# Patient Record
Sex: Male | Born: 1945
Health system: Southern US, Community
[De-identification: ages and names within clinical notes are randomized; demographics above are authoritative.]

## PROBLEM LIST (undated history)

## (undated) ENCOUNTER — Emergency Department (HOSPITAL_COMMUNITY): Admission: EM | Discharge status: Against Medical Advice | Payer: Medicare Other

## (undated) DIAGNOSIS — I10 Essential (primary) hypertension: Secondary | ICD-10-CM

## (undated) DIAGNOSIS — E785 Hyperlipidemia, unspecified: Secondary | ICD-10-CM

## (undated) DIAGNOSIS — E119 Type 2 diabetes mellitus without complications: Secondary | ICD-10-CM

## (undated) DIAGNOSIS — I639 Cerebral infarction, unspecified: Secondary | ICD-10-CM

## (undated) DIAGNOSIS — G459 Transient cerebral ischemic attack, unspecified: Secondary | ICD-10-CM

## (undated) DIAGNOSIS — R569 Unspecified convulsions: Secondary | ICD-10-CM

## (undated) DIAGNOSIS — J45909 Unspecified asthma, uncomplicated: Secondary | ICD-10-CM

## (undated) DIAGNOSIS — G4733 Obstructive sleep apnea (adult) (pediatric): Secondary | ICD-10-CM

## (undated) DIAGNOSIS — F329 Major depressive disorder, single episode, unspecified: Secondary | ICD-10-CM

## (undated) DIAGNOSIS — R471 Dysarthria and anarthria: Secondary | ICD-10-CM

## (undated) DIAGNOSIS — C61 Malignant neoplasm of prostate: Secondary | ICD-10-CM

## (undated) DIAGNOSIS — J449 Chronic obstructive pulmonary disease, unspecified: Secondary | ICD-10-CM

## (undated) DIAGNOSIS — F32A Depression, unspecified: Secondary | ICD-10-CM

## (undated) HISTORY — DX: Obstructive sleep apnea (adult) (pediatric): G47.33

## (undated) HISTORY — DX: Hyperlipidemia, unspecified: E78.5

## (undated) HISTORY — DX: Type 2 diabetes mellitus without complications: E11.9

## (undated) HISTORY — DX: Chronic obstructive pulmonary disease, unspecified: J44.9

## (undated) HISTORY — DX: Unspecified convulsions: R56.9

## (undated) HISTORY — DX: Depression, unspecified: F32.A

## (undated) HISTORY — DX: Malignant neoplasm of prostate: C61

## (undated) HISTORY — DX: Transient cerebral ischemic attack, unspecified: G45.9

## (undated) HISTORY — DX: Essential (primary) hypertension: I10

## (undated) HISTORY — DX: Dysarthria and anarthria: R47.1

## (undated) HISTORY — DX: Major depressive disorder, single episode, unspecified: F32.9

## (undated) HISTORY — DX: Cerebral infarction, unspecified: I63.9

---

## 2005-06-02 ENCOUNTER — Ambulatory Visit: Payer: Self-pay | Admitting: Cardiology

## 2006-10-01 ENCOUNTER — Ambulatory Visit: Payer: Self-pay | Admitting: Cardiology

## 2006-10-08 ENCOUNTER — Ambulatory Visit: Payer: Self-pay | Admitting: Cardiology

## 2010-08-14 DIAGNOSIS — R7989 Other specified abnormal findings of blood chemistry: Secondary | ICD-10-CM

## 2010-08-14 DIAGNOSIS — R9431 Abnormal electrocardiogram [ECG] [EKG]: Secondary | ICD-10-CM

## 2010-08-15 DIAGNOSIS — R079 Chest pain, unspecified: Secondary | ICD-10-CM

## 2011-01-16 DIAGNOSIS — I639 Cerebral infarction, unspecified: Secondary | ICD-10-CM

## 2011-01-16 HISTORY — DX: Cerebral infarction, unspecified: I63.9

## 2011-03-26 ENCOUNTER — Ambulatory Visit: Payer: Medicare Other | Attending: Neurology | Admitting: Sleep Medicine

## 2011-03-26 DIAGNOSIS — G4730 Sleep apnea, unspecified: Secondary | ICD-10-CM

## 2011-03-26 DIAGNOSIS — G4733 Obstructive sleep apnea (adult) (pediatric): Secondary | ICD-10-CM | POA: Insufficient documentation

## 2011-03-26 DIAGNOSIS — Z6836 Body mass index (BMI) 36.0-36.9, adult: Secondary | ICD-10-CM | POA: Insufficient documentation

## 2011-04-03 NOTE — Procedures (Signed)
NAMERANDEN, KAUTH              ACCOUNT NO.:  [id number]  MEDICAL RECORD NO.:  [id number]          PATIENT TYPE:  OUT  LOCATION:  SLEEP LAB                     FACILITY:  [facility]  PHYSICIAN:  Hjalmar Ballengee A. Gerilyn Pilgrim, M.D. DATE OF BIRTH:  30-Aug-1945  DATE OF STUDY:                           NOCTURNAL POLYSOMNOGRAM  REFERRING PHYSICIAN:  INDICATION:  A 65 year old man who presents with hypersomnia, fatigue and snoring.  This study is being done to evaluate for obstructive sleep apnea syndrome.  MEDICATIONS:  Metformin, Paxil, amoxicillin, omeprazole, hydrocodone, diltiazem, prednisone, terazosin, lisinopril and phenytoin.  EPWORTH SLEEPINESS SCALE: 1. BMI 36.  ARCHITECTURAL SUMMARY:  The total recording time is 426 minutes.  Sleep efficiency is 67.3%.  Sleep latency 25 minutes.  REM latency 313.5 minutes.  Stage N1 16.1%, N2 75%, N3 7.2% and REM sleep 1.7%.  RESPIRATORY SUMMARY:  Baseline oxygen saturation is 95, lowest saturation 85 during non-REM sleep.  Diagnostic AHI is 28.9 and RDI 38.1.  LIMB MOVEMENT SUMMARY:  PLM index 0.  ELECTROCARDIOGRAM SUMMARY:  Average heart rate is 60 with no significant dysrhythmias observed.  IMPRESSION:  Moderate obstructive sleep apnea syndrome.  RECOMMENDATION:  Positive CPAP titration study.   Dajah Fischman A. Gerilyn Pilgrim, M.D.    KAD/MEDQ  D:  04/01/2011 19:17:33  T:  04/02/2011 02:05:07  Job:  [id number]

## 2011-07-10 DIAGNOSIS — R9431 Abnormal electrocardiogram [ECG] [EKG]: Secondary | ICD-10-CM

## 2011-07-10 DIAGNOSIS — R748 Abnormal levels of other serum enzymes: Secondary | ICD-10-CM

## 2013-03-17 HISTORY — PX: XI ROBOTIC ASSISTED SIMPLE PROSTATECTOMY: SHX6713

## 2014-02-05 ENCOUNTER — Ambulatory Visit (HOSPITAL_COMMUNITY)
Admission: RE | Admit: 2014-02-05 | Discharge: 2014-02-05 | Disposition: A | Discharge status: Home / Self Care | Payer: Medicare PPO | Source: Ambulatory Visit | Attending: Internal Medicine | Admitting: Internal Medicine

## 2014-02-05 DIAGNOSIS — R7881 Bacteremia: Secondary | ICD-10-CM | POA: Diagnosis not present

## 2014-02-05 MED ORDER — SODIUM CHLORIDE 0.9 % IJ SOLN: 10.0000 mL | Freq: Two times a day (BID) | INTRAMUSCULAR | Status: DC

## 2014-02-05 MED ORDER — SODIUM CHLORIDE 0.9 % IJ SOLN
10.0000 mL | INTRAMUSCULAR | Status: DC | PRN
  Administered 2014-02-05: 20 mL

## 2014-02-05 NOTE — Progress Notes (Signed)
Peripherally Inserted Central Catheter/Midline Placement  The IV Nurse has discussed with the patient and/or persons authorized to consent for the patient, the purpose of this procedure and the potential benefits and risks involved with this procedure.  The benefits include less needle sticks, lab draws from the catheter and patient may be discharged home with the catheter.  Risks include, but not limited to, infection, bleeding, blood clot (thrombus formation), and puncture of an artery; nerve damage and irregular heat beat.  Alternatives to this procedure were also discussed.  PICC/Midline Placement Documentation  PICC / Midline Single Lumen 85/63/14 PICC Right Basilic 45 cm 2 cm (Active)  Indication for Insertion or Continuance of Line Limited venous access - need for IV therapy >5 days (PICC only);Administration of hyperosmolar/irritating solutions (i.e. TPN, Vancomycin, etc.) 02/05/2014  9:06 AM  Exposed Catheter (cm) 2 cm 02/05/2014  9:06 AM  Site Assessment Clean;Dry;Intact 02/05/2014  9:06 AM  Line Status Flushed;Saline locked;Capped (central line);Accessed 02/05/2014  9:06 AM  Dressing Type Transparent;Securing device 02/05/2014  9:06 AM  Dressing Status Clean;Dry;Intact;Antimicrobial disc in place 02/05/2014  9:06 AM  Dressing Intervention New dressing 02/05/2014  9:06 AM  Dressing Change Due 02/12/14 02/05/2014  9:06 AM       Petra Kuba D 02/05/2014, 9:10 AM

## 2014-02-05 NOTE — Discharge Instructions (Signed)
PICC Home Guide A peripherally inserted central catheter (PICC) is a long, thin, flexible tube that is inserted into a vein in the upper arm. It is a form of intravenous (IV) access. It is considered to be a "central" line because the tip of the PICC ends in a large vein in your chest. This large vein is called the superior vena cava (SVC). The PICC tip ends in the SVC because there is a lot of blood flow in the SVC. This allows medicines and IV fluids to be quickly distributed throughout the body. The PICC is inserted using a sterile technique by a specially trained nurse or physician. After the PICC is inserted, a chest X-ray exam is done to be sure it is in the correct place.  A PICC may be placed for different reasons, such as:  To give medicines and liquid nutrition that can only be given through a central line. Examples are:  Certain antibiotic treatments.  Chemotherapy.  Total parenteral nutrition (TPN).  To take frequent blood samples.  To give IV fluids and blood products.  If there is difficulty placing a peripheral intravenous (PIV) catheter. If taken care of properly, a PICC can remain in place for several months. A PICC can also allow a person to go home from the hospital early. Medicine and PICC care can be managed at home by a family member or home health care team. WHAT PROBLEMS CAN HAPPEN WHEN I HAVE A PICC? Problems with a PICC can occasionally occur. These may include the following:  A blood clot (thrombus) forming in or at the tip of the PICC. This can cause the PICC to become clogged. A clot-dissolving medicine called tissue plasminogen activator (tPA) can be given through the PICC to help break up the clot.  Inflammation of the vein (phlebitis) in which the PICC is placed. Signs of inflammation may include redness, pain at the insertion site, red streaks, or being able to feel a "cord" in the vein where the PICC is located.  Infection in the PICC or at the insertion  site. Signs of infection may include fever, chills, redness, swelling, or pus drainage from the PICC insertion site.  PICC movement (malposition). The PICC tip may move from its original position due to excessive physical activity, forceful coughing, sneezing, or vomiting.  A break or cut in the PICC. It is important to not use scissors near the PICC.  Nerve or tendon irritation or injury during PICC insertion. WHAT SHOULD I KEEP IN MIND ABOUT ACTIVITIES WHEN I HAVE A PICC?  You may bend your arm and move it freely. If your PICC is near or at the bend of your elbow, avoid activity with repeated motion at the elbow.  Rest at home for the remainder of the day following PICC line insertion.  Avoid lifting heavy objects as instructed by your health care provider.  Avoid using a crutch with the arm on the same side as your PICC. You may need to use a walker. WHAT SHOULD I KNOW ABOUT MY PICC DRESSING?  Keep your PICC bandage (dressing) clean and dry to prevent infection.  Ask your health care provider when you may shower. Ask your health care provider to teach you how to wrap the PICC when you do take a shower.  Change the PICC dressing as instructed by your health care provider.  Change your PICC dressing if it becomes loose or wet. WHAT SHOULD I KNOW ABOUT PICC CARE?  Check the PICC insertion site   daily for leakage, redness, swelling, or pain.  Do not take a bath, swim, or use hot tubs when you have a PICC. Cover PICC line with clear plastic wrap and tape to keep it dry while showering.  Flush the PICC as directed by your health care provider. Let your health care provider know right away if the PICC is difficult to flush or does not flush. Do not use force to flush the PICC.  Do not use a syringe that is less than 10 mL to flush the PICC.  Never pull or tug on the PICC.  Avoid blood pressure checks on the arm with the PICC.  Keep your PICC identification card with you at all  times.  Do not take the PICC out yourself. Only a trained clinical professional should remove the PICC. SEEK IMMEDIATE MEDICAL CARE IF:  Your PICC is accidentally pulled all the way out. If this happens, cover the insertion site with a bandage or gauze dressing. Do not throw the PICC away. Your health care provider will need to inspect it.  Your PICC was tugged or pulled and has partially come out. Do not  push the PICC back in.  There is any type of drainage, redness, or swelling where the PICC enters the skin.  You cannot flush the PICC, it is difficult to flush, or the PICC leaks around the insertion site when it is flushed.  You hear a "flushing" sound when the PICC is flushed.  You have pain, discomfort, or numbness in your arm, shoulder, or jaw on the same side as the PICC.  You feel your heart "racing" or skipping beats.  You notice a hole or tear in the PICC.  You develop chills or a fever. MAKE SURE YOU:   Understand these instructions.  Will watch your condition.  Will get help right away if you are not doing well or get worse. Placement verified with 3CG technology Call for any problems [phone]; Micheal Sheen or Anderson Malta

## 2015-11-15 ENCOUNTER — Encounter: Payer: Self-pay | Admitting: *Deleted

## 2015-11-15 ENCOUNTER — Other Ambulatory Visit: Payer: Self-pay | Admitting: *Deleted

## 2015-11-16 ENCOUNTER — Encounter: Payer: Self-pay | Admitting: Cardiology

## 2015-11-16 ENCOUNTER — Encounter: Payer: Self-pay | Admitting: *Deleted

## 2015-11-16 ENCOUNTER — Ambulatory Visit (INDEPENDENT_AMBULATORY_CARE_PROVIDER_SITE_OTHER): Payer: Medicare PPO | Admitting: Cardiology

## 2015-11-16 VITALS — BP 127/76 | HR 66 | Ht 69.0 in | Wt 221.0 lb

## 2015-11-16 DIAGNOSIS — Z01810 Encounter for preprocedural cardiovascular examination: Secondary | ICD-10-CM

## 2015-11-16 DIAGNOSIS — E785 Hyperlipidemia, unspecified: Secondary | ICD-10-CM | POA: Diagnosis not present

## 2015-11-16 DIAGNOSIS — I10 Essential (primary) hypertension: Secondary | ICD-10-CM

## 2015-11-16 DIAGNOSIS — R0602 Shortness of breath: Secondary | ICD-10-CM | POA: Diagnosis not present

## 2015-11-16 DIAGNOSIS — Z136 Encounter for screening for cardiovascular disorders: Secondary | ICD-10-CM

## 2015-11-16 NOTE — Patient Instructions (Signed)
Medication Instructions:  Continue all current medications.  Labwork: NONE   Testing/Procedures:  Your physician has requested that you have an echocardiogram. Echocardiography is a painless test that uses sound waves to create images of your heart. It provides your doctor with information about the size and shape of your heart and how well your heart's chambers and valves are working. This procedure takes approximately one hour. There are no restrictions for this procedure.  Office will contact with results via phone or letter.    Follow-Up: Pending, to be based off test result.    Any Other Special Instructions Will Be Listed Below (If Applicable).  If you need a refill on your cardiac medications before your next appointment, please call your pharmacy.

## 2015-11-16 NOTE — Progress Notes (Addendum)
Clinical Summary Derrick Bender is a 70 y.o.male seen today as a new patient. He was previously followed by [facility] Dr Remer Macho. Referred by Dr Woody Seller for preoperative evaluation.    1. Preoperative evaluation - upcoming urological surgery at [facility] - denies any chest pain. + SOB/DOE room to room at home, which is stable. Denies any LE edema. +coughing + wheezing. Former smoker x 30 years. No orthopnea or PND  2. OSA - compliant with CPAP  3. HTN - compliant with meds  4. Hyperlipidemia - compliant with simvastatin.  5. History of TIA - has been on plavix  6. COPD - followed by primary Past Medical History:  Diagnosis Date  . COPD (chronic obstructive pulmonary disease) ([place])   . CVA (cerebral vascular accident) ([place]) 01/2011   [place]  . Depression   . Diabetes ([place])   . Dysarthria   . Hyperlipidemia   . Hypertension   . OSA (obstructive sleep apnea)   . Prostate cancer ([place])    Dr. Exie Parody  . Seizure ([place])   . TIA (transient ischemic attack)      Allergies  Allergen Reactions  . Penicillin G     Other reaction(s): Other **MOUTH LESIONS**  . Sulfamethoxazole Dermatitis  . Aggrenox [Aspirin-Dipyridamole Er] Other (See Comments)    headache  . Cephalosporins Itching  . Prilosec [Omeprazole] Itching  . Vancomycin Rash     Current Outpatient Prescriptions  Medication Sig Dispense Refill  . albuterol (PROAIR HFA) 108 (90 Base) MCG/ACT inhaler Inhale 2 puffs into the lungs every 6 (six) hours as needed.    Marland Kitchen aspirin EC 81 MG tablet Take 81 mg by mouth daily.    . budesonide-formoterol (SYMBICORT) 160-4.5 MCG/ACT inhaler Inhale 2 puffs into the lungs 2 (two) times daily.    . clopidogrel (PLAVIX) 75 MG tablet Take 75 mg by mouth daily.    . diazepam (VALIUM) 5 MG tablet Take 5 mg by mouth 2 (two) times daily as needed.    . diltiazem (CARDIZEM CD) 180 MG 24 hr capsule Take 180 mg by mouth daily.    Marland Kitchen ipratropium-albuterol (DUONEB) 0.5-2.5 (3) MG/3ML  SOLN Inhale 3 mLs into the lungs every 6 (six) hours as needed.    Marland Kitchen lisinopril (PRINIVIL,ZESTRIL) 20 MG tablet Take 20 mg by mouth daily.    Marland Kitchen omeprazole (PRILOSEC) 20 MG capsule Take 20 mg by mouth daily.    Marland Kitchen PARoxetine (PAXIL) 40 MG tablet Take 20 mg by mouth daily.    . phenytoin (DILANTIN) 100 MG ER capsule Take 100 mg by mouth 3 (three) times daily.    . predniSONE (DELTASONE) 10 MG tablet Take 10 mg by mouth daily.    Marland Kitchen senna (SENOKOT) 8.6 MG tablet Take 8.6 mg by mouth 2 (two) times daily as needed.    . simvastatin (ZOCOR) 20 MG tablet Take 20 mg by mouth at bedtime.    . Testosterone (ANDROGEL) 40.5 MG/2.5GM (1.62%) GEL Place 1 application onto the skin as directed.    . topiramate (TOPAMAX) 100 MG tablet Take 100 mg by mouth 2 (two) times daily.     No current facility-administered medications for this visit.      Past Surgical History:  Procedure Laterality Date  . XI ROBOTIC ASSISTED SIMPLE PROSTATECTOMY  03/2013   Dr. Brendia Sacks     Allergies  Allergen Reactions  . Penicillin G     Other reaction(s): Other **MOUTH LESIONS**  . Sulfamethoxazole Dermatitis  . Aggrenox [  Aspirin-Dipyridamole Er] Other (See Comments)    headache  . Cephalosporins Itching  . Prilosec [Omeprazole] Itching  . Vancomycin Rash      Family History  Problem Relation Age of Onset  . Bone cancer Mother   . Pancreatic cancer Father      Social History Mr. Donegan reports that he quit smoking about 3 years ago. His smoking use included Cigarettes. He does not have any smokeless tobacco history on file. Mr. Tolman has no alcohol history on file.   Review of Systems CONSTITUTIONAL: No weight loss, fever, chills, weakness or fatigue.  HEENT: Eyes: No visual loss, blurred vision, double vision or yellow sclerae.No hearing loss, sneezing, congestion, runny nose or sore throat.  SKIN: No rash or itching.  CARDIOVASCULAR: per HPI RESPIRATORY: per HPI GASTROINTESTINAL: No anorexia, nausea,  vomiting or diarrhea. No abdominal pain or blood.  GENITOURINARY: No burning on urination, no polyuria NEUROLOGICAL: No headache, dizziness, syncope, paralysis, ataxia, numbness or tingling in the extremities. No change in bowel or bladder control.  MUSCULOSKELETAL: No muscle, back pain, joint pain or stiffness.  LYMPHATICS: No enlarged nodes. No history of splenectomy.  PSYCHIATRIC: No history of depression or anxiety.  ENDOCRINOLOGIC: No reports of sweating, cold or heat intolerance. No polyuria or polydipsia.  Marland Kitchen   Physical Examination Vitals:   11/16/15 0842  BP: 127/76  Pulse: 66   Vitals:   11/16/15 0842  Weight: 221 lb (100.2 kg)  Height: 5\' 9"  (1.753 m)    Gen: resting comfortably, no acute distress HEENT: no scleral icterus, pupils equal round and reactive, no palptable cervical adenopathy,  CV: RRR, no m/r,g no jvd Resp: Clear to auscultation bilaterally GI: abdomen is soft, non-tender, non-distended, normal bowel sounds, no hepatosplenomegaly MSK: extremities are warm, no edema.  Skin: warm, no rash Neuro:  no focal deficits Psych: appropriate affect      Assessment and Plan  1. Preoperative evaluation - significant SOB/DOE with low levels of activity. Unclear if cardiac or pulmonary etiology given his history of COPD - will need further assessment before being cleared for surgery - we will plan for an echo, pending results likely pursue a lexiscan MPI 2 day protocol  2. Hyperlipidemia - management per pcp. Patient reports history of CVA, would consider high dose statin atorva 40-80mg   3. SOB - workup as described above - EKG in clinic NSR  4. HTN - at goal, continue current meds  F/u pending testing results. Surgical clearance pending test results  Derrick Bender, M.D.  12/23/15 Addendum Negative cardiac workup including echo and stress test. His SOB appears to be all related to COPD. From cardiac standpoint ok to proceed with urological  procedure, defer risk stratification for his COPD to his primary. He will f/u with [place] in 6 months.   Zandra Abts MD

## 2015-12-08 ENCOUNTER — Ambulatory Visit: Payer: Medicare PPO

## 2015-12-08 ENCOUNTER — Other Ambulatory Visit: Payer: Self-pay

## 2015-12-08 DIAGNOSIS — R0602 Shortness of breath: Secondary | ICD-10-CM

## 2015-12-08 DIAGNOSIS — R0600 Dyspnea, unspecified: Secondary | ICD-10-CM | POA: Diagnosis not present

## 2015-12-14 ENCOUNTER — Encounter: Payer: Self-pay | Admitting: *Deleted

## 2015-12-14 ENCOUNTER — Telehealth: Payer: Self-pay | Admitting: *Deleted

## 2015-12-14 DIAGNOSIS — Z01818 Encounter for other preprocedural examination: Secondary | ICD-10-CM

## 2015-12-14 NOTE — Telephone Encounter (Signed)
Pt aware and agreeable to stress test - will place orders and route to schedulers

## 2015-12-14 NOTE — Telephone Encounter (Signed)
-----   Message from Arnoldo Lenis, MD sent at 12/13/2015 10:48 AM EDT ----- Echo overal looks good. Normal heart function. Please order lexiscan 2 day protocol, do not need to hold any meds  Zandra Abts MD

## 2015-12-22 ENCOUNTER — Encounter (HOSPITAL_COMMUNITY): Payer: Self-pay

## 2015-12-22 ENCOUNTER — Encounter (HOSPITAL_COMMUNITY): Payer: Medicare PPO

## 2015-12-22 ENCOUNTER — Ambulatory Visit (HOSPITAL_COMMUNITY)
Admission: RE | Admit: 2015-12-22 | Discharge: 2015-12-22 | Disposition: A | Discharge status: Home / Self Care | Payer: Medicare PPO | Source: Ambulatory Visit | Attending: Cardiology | Admitting: Cardiology

## 2015-12-22 ENCOUNTER — Encounter (HOSPITAL_COMMUNITY)
Admission: RE | Admit: 2015-12-22 | Discharge: 2015-12-22 | Disposition: A | Discharge status: Home / Self Care | Payer: Medicare PPO | Source: Ambulatory Visit | Attending: Cardiology | Admitting: Cardiology

## 2015-12-22 ENCOUNTER — Ambulatory Visit (HOSPITAL_BASED_OUTPATIENT_CLINIC_OR_DEPARTMENT_OTHER)
Admission: RE | Admit: 2015-12-22 | Discharge: 2015-12-22 | Disposition: A | Discharge status: Home / Self Care | Payer: Medicare PPO | Source: Ambulatory Visit | Attending: Cardiology | Admitting: Cardiology

## 2015-12-22 DIAGNOSIS — Z01818 Encounter for other preprocedural examination: Secondary | ICD-10-CM | POA: Insufficient documentation

## 2015-12-22 DIAGNOSIS — I493 Ventricular premature depolarization: Secondary | ICD-10-CM | POA: Diagnosis not present

## 2015-12-22 HISTORY — DX: Unspecified asthma, uncomplicated: J45.909

## 2015-12-22 LAB — NM MYOCAR MULTI W/SPECT W/WALL MOTION / EF
CHL CUP NUCLEAR SDS: 0
CHL CUP NUCLEAR SRS: 1
CHL CUP RESTING HR STRESS: 57 {beats}/min
LHR: 0.24
LV dias vol: 94 mL (ref 62–150)
LV sys vol: 33 mL
NUC STRESS TID: 0.76
Peak HR: 81 {beats}/min
SSS: 1

## 2015-12-22 MED ORDER — SODIUM CHLORIDE 0.9% FLUSH
INTRAVENOUS | Status: AC
  Administered 2015-12-22: 10 mL via INTRAVENOUS
  Filled 2015-12-22: qty 10

## 2015-12-22 MED ORDER — REGADENOSON 0.4 MG/5ML IV SOLN
INTRAVENOUS | Status: AC
  Administered 2015-12-22: 0.4 mg via INTRAVENOUS
  Filled 2015-12-22: qty 5

## 2015-12-22 MED ORDER — TECHNETIUM TC 99M TETROFOSMIN IV KIT
30.0000 | PACK | Freq: Once | INTRAVENOUS | Status: AC | PRN
Start: 2015-12-22 — End: 2015-12-22
  Administered 2015-12-22: 27.9 via INTRAVENOUS

## 2015-12-22 MED ORDER — TECHNETIUM TC 99M TETROFOSMIN IV KIT
10.0000 | PACK | Freq: Once | INTRAVENOUS | Status: AC | PRN
  Administered 2015-12-22: 10.4 via INTRAVENOUS

## 2015-12-23 ENCOUNTER — Encounter (HOSPITAL_COMMUNITY): Payer: Medicare PPO

## 2015-12-23 ENCOUNTER — Telehealth: Payer: Self-pay | Admitting: *Deleted

## 2015-12-23 NOTE — Telephone Encounter (Signed)
Pt aware, will routed to surgeon and pcp - recall placed

## 2015-12-23 NOTE — Telephone Encounter (Signed)
-----   Message from Arnoldo Lenis, MD sent at 12/23/2015 12:26 PM EDT ----- Stress test look good, overall the testing on his heart has looked good. No limitations on pursuing his surgery from heart standpoint, I defer his risk evaluation regarding his lungs to his pcp. Please forward my addended clinic note to his surgeon. He should see [place] back in 6 months   Zandra Abts MD

## 2017-10-19 DIAGNOSIS — H401124 Primary open-angle glaucoma, left eye, indeterminate stage: Secondary | ICD-10-CM | POA: Diagnosis not present

## 2017-10-19 DIAGNOSIS — H40011 Open angle with borderline findings, low risk, right eye: Secondary | ICD-10-CM | POA: Diagnosis not present

## 2017-10-23 DIAGNOSIS — J449 Chronic obstructive pulmonary disease, unspecified: Secondary | ICD-10-CM | POA: Diagnosis not present

## 2017-10-23 DIAGNOSIS — E119 Type 2 diabetes mellitus without complications: Secondary | ICD-10-CM | POA: Diagnosis not present

## 2017-10-23 DIAGNOSIS — I10 Essential (primary) hypertension: Secondary | ICD-10-CM | POA: Diagnosis not present

## 2017-10-23 DIAGNOSIS — E7800 Pure hypercholesterolemia, unspecified: Secondary | ICD-10-CM | POA: Diagnosis not present

## 2017-12-21 DIAGNOSIS — G4733 Obstructive sleep apnea (adult) (pediatric): Secondary | ICD-10-CM | POA: Diagnosis not present

## 2017-12-21 DIAGNOSIS — Z6834 Body mass index (BMI) 34.0-34.9, adult: Secondary | ICD-10-CM | POA: Diagnosis not present

## 2017-12-21 DIAGNOSIS — E1165 Type 2 diabetes mellitus with hyperglycemia: Secondary | ICD-10-CM | POA: Diagnosis not present

## 2017-12-21 DIAGNOSIS — J449 Chronic obstructive pulmonary disease, unspecified: Secondary | ICD-10-CM | POA: Diagnosis not present

## 2017-12-21 DIAGNOSIS — Z299 Encounter for prophylactic measures, unspecified: Secondary | ICD-10-CM | POA: Diagnosis not present

## 2018-01-02 DIAGNOSIS — I10 Essential (primary) hypertension: Secondary | ICD-10-CM | POA: Diagnosis not present

## 2018-01-02 DIAGNOSIS — R5383 Other fatigue: Secondary | ICD-10-CM | POA: Diagnosis not present

## 2018-01-02 DIAGNOSIS — E1165 Type 2 diabetes mellitus with hyperglycemia: Secondary | ICD-10-CM | POA: Diagnosis not present

## 2018-01-02 DIAGNOSIS — Z299 Encounter for prophylactic measures, unspecified: Secondary | ICD-10-CM | POA: Diagnosis not present

## 2018-01-02 DIAGNOSIS — I639 Cerebral infarction, unspecified: Secondary | ICD-10-CM | POA: Diagnosis not present

## 2018-01-02 DIAGNOSIS — R569 Unspecified convulsions: Secondary | ICD-10-CM | POA: Diagnosis not present

## 2018-01-04 DIAGNOSIS — E7800 Pure hypercholesterolemia, unspecified: Secondary | ICD-10-CM | POA: Diagnosis not present

## 2018-01-04 DIAGNOSIS — E119 Type 2 diabetes mellitus without complications: Secondary | ICD-10-CM | POA: Diagnosis not present

## 2018-01-04 DIAGNOSIS — J449 Chronic obstructive pulmonary disease, unspecified: Secondary | ICD-10-CM | POA: Diagnosis not present

## 2018-01-04 DIAGNOSIS — I10 Essential (primary) hypertension: Secondary | ICD-10-CM | POA: Diagnosis not present

## 2018-01-21 DIAGNOSIS — Z125 Encounter for screening for malignant neoplasm of prostate: Secondary | ICD-10-CM | POA: Diagnosis not present

## 2018-01-21 DIAGNOSIS — Z1211 Encounter for screening for malignant neoplasm of colon: Secondary | ICD-10-CM | POA: Diagnosis not present

## 2018-01-21 DIAGNOSIS — Z7189 Other specified counseling: Secondary | ICD-10-CM | POA: Diagnosis not present

## 2018-01-21 DIAGNOSIS — R569 Unspecified convulsions: Secondary | ICD-10-CM | POA: Diagnosis not present

## 2018-01-21 DIAGNOSIS — R109 Unspecified abdominal pain: Secondary | ICD-10-CM | POA: Diagnosis not present

## 2018-01-21 DIAGNOSIS — E7800 Pure hypercholesterolemia, unspecified: Secondary | ICD-10-CM | POA: Diagnosis not present

## 2018-01-21 DIAGNOSIS — Z1339 Encounter for screening examination for other mental health and behavioral disorders: Secondary | ICD-10-CM | POA: Diagnosis not present

## 2018-01-21 DIAGNOSIS — R5383 Other fatigue: Secondary | ICD-10-CM | POA: Diagnosis not present

## 2018-01-21 DIAGNOSIS — Z1331 Encounter for screening for depression: Secondary | ICD-10-CM | POA: Diagnosis not present

## 2018-01-21 DIAGNOSIS — R112 Nausea with vomiting, unspecified: Secondary | ICD-10-CM | POA: Diagnosis not present

## 2018-01-21 DIAGNOSIS — E1165 Type 2 diabetes mellitus with hyperglycemia: Secondary | ICD-10-CM | POA: Diagnosis not present

## 2018-01-21 DIAGNOSIS — Z0000 Encounter for general adult medical examination without abnormal findings: Secondary | ICD-10-CM | POA: Diagnosis not present

## 2018-01-28 DIAGNOSIS — L280 Lichen simplex chronicus: Secondary | ICD-10-CM | POA: Diagnosis not present

## 2018-01-28 DIAGNOSIS — Q828 Other specified congenital malformations of skin: Secondary | ICD-10-CM | POA: Diagnosis not present

## 2018-01-28 DIAGNOSIS — D485 Neoplasm of uncertain behavior of skin: Secondary | ICD-10-CM | POA: Diagnosis not present

## 2018-01-28 DIAGNOSIS — Z6833 Body mass index (BMI) 33.0-33.9, adult: Secondary | ICD-10-CM | POA: Diagnosis not present

## 2018-01-28 DIAGNOSIS — Z299 Encounter for prophylactic measures, unspecified: Secondary | ICD-10-CM | POA: Diagnosis not present

## 2018-02-13 DIAGNOSIS — I10 Essential (primary) hypertension: Secondary | ICD-10-CM | POA: Diagnosis not present

## 2018-02-13 DIAGNOSIS — J449 Chronic obstructive pulmonary disease, unspecified: Secondary | ICD-10-CM | POA: Diagnosis not present

## 2018-02-13 DIAGNOSIS — E7800 Pure hypercholesterolemia, unspecified: Secondary | ICD-10-CM | POA: Diagnosis not present

## 2018-02-13 DIAGNOSIS — E119 Type 2 diabetes mellitus without complications: Secondary | ICD-10-CM | POA: Diagnosis not present

## 2018-03-05 DIAGNOSIS — R351 Nocturia: Secondary | ICD-10-CM | POA: Diagnosis not present

## 2018-03-05 DIAGNOSIS — Z8546 Personal history of malignant neoplasm of prostate: Secondary | ICD-10-CM | POA: Diagnosis not present

## 2018-03-05 DIAGNOSIS — N3941 Urge incontinence: Secondary | ICD-10-CM | POA: Diagnosis not present

## 2018-03-05 DIAGNOSIS — Z6833 Body mass index (BMI) 33.0-33.9, adult: Secondary | ICD-10-CM | POA: Diagnosis not present

## 2018-03-08 DIAGNOSIS — H401124 Primary open-angle glaucoma, left eye, indeterminate stage: Secondary | ICD-10-CM | POA: Diagnosis not present

## 2018-03-18 DIAGNOSIS — J449 Chronic obstructive pulmonary disease, unspecified: Secondary | ICD-10-CM | POA: Diagnosis not present

## 2018-03-18 DIAGNOSIS — Z6834 Body mass index (BMI) 34.0-34.9, adult: Secondary | ICD-10-CM | POA: Diagnosis not present

## 2018-03-18 DIAGNOSIS — G500 Trigeminal neuralgia: Secondary | ICD-10-CM | POA: Diagnosis not present

## 2018-03-18 DIAGNOSIS — Z299 Encounter for prophylactic measures, unspecified: Secondary | ICD-10-CM | POA: Diagnosis not present

## 2018-03-18 DIAGNOSIS — M542 Cervicalgia: Secondary | ICD-10-CM | POA: Diagnosis not present

## 2018-03-18 DIAGNOSIS — N400 Enlarged prostate without lower urinary tract symptoms: Secondary | ICD-10-CM | POA: Diagnosis not present

## 2018-03-25 DIAGNOSIS — J449 Chronic obstructive pulmonary disease, unspecified: Secondary | ICD-10-CM | POA: Diagnosis not present

## 2018-03-25 DIAGNOSIS — E7800 Pure hypercholesterolemia, unspecified: Secondary | ICD-10-CM | POA: Diagnosis not present

## 2018-03-25 DIAGNOSIS — I10 Essential (primary) hypertension: Secondary | ICD-10-CM | POA: Diagnosis not present

## 2018-03-25 DIAGNOSIS — E119 Type 2 diabetes mellitus without complications: Secondary | ICD-10-CM | POA: Diagnosis not present

## 2018-03-28 DIAGNOSIS — E1165 Type 2 diabetes mellitus with hyperglycemia: Secondary | ICD-10-CM | POA: Diagnosis not present

## 2018-03-28 DIAGNOSIS — Z6834 Body mass index (BMI) 34.0-34.9, adult: Secondary | ICD-10-CM | POA: Diagnosis not present

## 2018-03-28 DIAGNOSIS — R569 Unspecified convulsions: Secondary | ICD-10-CM | POA: Diagnosis not present

## 2018-03-28 DIAGNOSIS — I10 Essential (primary) hypertension: Secondary | ICD-10-CM | POA: Diagnosis not present

## 2018-03-28 DIAGNOSIS — Z299 Encounter for prophylactic measures, unspecified: Secondary | ICD-10-CM | POA: Diagnosis not present

## 2018-03-28 DIAGNOSIS — J449 Chronic obstructive pulmonary disease, unspecified: Secondary | ICD-10-CM | POA: Diagnosis not present

## 2018-04-30 DIAGNOSIS — Z6834 Body mass index (BMI) 34.0-34.9, adult: Secondary | ICD-10-CM | POA: Diagnosis not present

## 2018-04-30 DIAGNOSIS — Z299 Encounter for prophylactic measures, unspecified: Secondary | ICD-10-CM | POA: Diagnosis not present

## 2018-04-30 DIAGNOSIS — J449 Chronic obstructive pulmonary disease, unspecified: Secondary | ICD-10-CM | POA: Diagnosis not present

## 2018-04-30 DIAGNOSIS — I10 Essential (primary) hypertension: Secondary | ICD-10-CM | POA: Diagnosis not present

## 2018-04-30 DIAGNOSIS — H02403 Unspecified ptosis of bilateral eyelids: Secondary | ICD-10-CM | POA: Diagnosis not present

## 2018-04-30 DIAGNOSIS — E1165 Type 2 diabetes mellitus with hyperglycemia: Secondary | ICD-10-CM | POA: Diagnosis not present

## 2018-05-08 DIAGNOSIS — R413 Other amnesia: Secondary | ICD-10-CM | POA: Diagnosis not present

## 2018-05-09 DIAGNOSIS — D352 Benign neoplasm of pituitary gland: Secondary | ICD-10-CM | POA: Diagnosis not present

## 2018-05-09 DIAGNOSIS — E237 Disorder of pituitary gland, unspecified: Secondary | ICD-10-CM | POA: Diagnosis not present

## 2018-05-10 DIAGNOSIS — I10 Essential (primary) hypertension: Secondary | ICD-10-CM | POA: Diagnosis not present

## 2018-05-10 DIAGNOSIS — E119 Type 2 diabetes mellitus without complications: Secondary | ICD-10-CM | POA: Diagnosis not present

## 2018-05-10 DIAGNOSIS — J449 Chronic obstructive pulmonary disease, unspecified: Secondary | ICD-10-CM | POA: Diagnosis not present

## 2018-05-10 DIAGNOSIS — E7800 Pure hypercholesterolemia, unspecified: Secondary | ICD-10-CM | POA: Diagnosis not present

## 2018-05-17 ENCOUNTER — Telehealth: Payer: Self-pay | Admitting: Cardiology

## 2018-05-17 NOTE — Telephone Encounter (Signed)
Numerous attempts to contact patient with recall letters. Unable to reach by telephone. with no success.          03/17/2016 11:03 PM Notification Sent [20]    08/30/2016 11:36 AM Notification Sent [20]    06/27/2017 11:40 AM Notification Sent [20]    05/17/2018 4:46 PM Notification Sent [20]

## 2018-05-22 DIAGNOSIS — G4730 Sleep apnea, unspecified: Secondary | ICD-10-CM | POA: Diagnosis not present

## 2018-05-22 DIAGNOSIS — Z87891 Personal history of nicotine dependence: Secondary | ICD-10-CM | POA: Diagnosis not present

## 2018-05-22 DIAGNOSIS — R413 Other amnesia: Secondary | ICD-10-CM | POA: Diagnosis not present

## 2018-05-22 DIAGNOSIS — F329 Major depressive disorder, single episode, unspecified: Secondary | ICD-10-CM | POA: Diagnosis not present

## 2018-05-22 DIAGNOSIS — J449 Chronic obstructive pulmonary disease, unspecified: Secondary | ICD-10-CM | POA: Diagnosis not present

## 2018-05-22 DIAGNOSIS — Z6834 Body mass index (BMI) 34.0-34.9, adult: Secondary | ICD-10-CM | POA: Diagnosis not present

## 2018-05-22 DIAGNOSIS — Z299 Encounter for prophylactic measures, unspecified: Secondary | ICD-10-CM | POA: Diagnosis not present

## 2018-05-27 DIAGNOSIS — H02403 Unspecified ptosis of bilateral eyelids: Secondary | ICD-10-CM | POA: Diagnosis not present

## 2018-06-07 DIAGNOSIS — E119 Type 2 diabetes mellitus without complications: Secondary | ICD-10-CM | POA: Diagnosis not present

## 2018-06-07 DIAGNOSIS — E7800 Pure hypercholesterolemia, unspecified: Secondary | ICD-10-CM | POA: Diagnosis not present

## 2018-06-07 DIAGNOSIS — J449 Chronic obstructive pulmonary disease, unspecified: Secondary | ICD-10-CM | POA: Diagnosis not present

## 2018-06-07 DIAGNOSIS — I10 Essential (primary) hypertension: Secondary | ICD-10-CM | POA: Diagnosis not present

## 2018-06-17 DIAGNOSIS — I639 Cerebral infarction, unspecified: Secondary | ICD-10-CM | POA: Diagnosis not present

## 2018-06-17 DIAGNOSIS — Z6834 Body mass index (BMI) 34.0-34.9, adult: Secondary | ICD-10-CM | POA: Diagnosis not present

## 2018-06-17 DIAGNOSIS — I10 Essential (primary) hypertension: Secondary | ICD-10-CM | POA: Diagnosis not present

## 2018-06-17 DIAGNOSIS — R58 Hemorrhage, not elsewhere classified: Secondary | ICD-10-CM | POA: Diagnosis not present

## 2018-06-17 DIAGNOSIS — Z299 Encounter for prophylactic measures, unspecified: Secondary | ICD-10-CM | POA: Diagnosis not present

## 2018-06-17 DIAGNOSIS — J449 Chronic obstructive pulmonary disease, unspecified: Secondary | ICD-10-CM | POA: Diagnosis not present

## 2018-06-17 DIAGNOSIS — E1165 Type 2 diabetes mellitus with hyperglycemia: Secondary | ICD-10-CM | POA: Diagnosis not present

## 2018-06-27 DIAGNOSIS — H02423 Myogenic ptosis of bilateral eyelids: Secondary | ICD-10-CM | POA: Diagnosis not present

## 2018-06-27 DIAGNOSIS — H02413 Mechanical ptosis of bilateral eyelids: Secondary | ICD-10-CM | POA: Diagnosis not present

## 2018-06-27 DIAGNOSIS — Z8546 Personal history of malignant neoplasm of prostate: Secondary | ICD-10-CM | POA: Diagnosis not present

## 2018-06-27 DIAGNOSIS — E119 Type 2 diabetes mellitus without complications: Secondary | ICD-10-CM | POA: Diagnosis not present

## 2018-06-27 DIAGNOSIS — E785 Hyperlipidemia, unspecified: Secondary | ICD-10-CM | POA: Diagnosis not present

## 2018-06-27 DIAGNOSIS — Z8673 Personal history of transient ischemic attack (TIA), and cerebral infarction without residual deficits: Secondary | ICD-10-CM | POA: Diagnosis not present

## 2018-06-27 DIAGNOSIS — H02403 Unspecified ptosis of bilateral eyelids: Secondary | ICD-10-CM | POA: Diagnosis not present

## 2018-06-27 DIAGNOSIS — G4733 Obstructive sleep apnea (adult) (pediatric): Secondary | ICD-10-CM | POA: Diagnosis not present

## 2018-06-27 DIAGNOSIS — J449 Chronic obstructive pulmonary disease, unspecified: Secondary | ICD-10-CM | POA: Diagnosis not present

## 2018-06-27 DIAGNOSIS — I10 Essential (primary) hypertension: Secondary | ICD-10-CM | POA: Diagnosis not present

## 2018-06-27 DIAGNOSIS — Z8589 Personal history of malignant neoplasm of other organs and systems: Secondary | ICD-10-CM | POA: Diagnosis not present

## 2018-07-03 DIAGNOSIS — E7800 Pure hypercholesterolemia, unspecified: Secondary | ICD-10-CM | POA: Diagnosis not present

## 2018-07-03 DIAGNOSIS — E119 Type 2 diabetes mellitus without complications: Secondary | ICD-10-CM | POA: Diagnosis not present

## 2018-07-03 DIAGNOSIS — I10 Essential (primary) hypertension: Secondary | ICD-10-CM | POA: Diagnosis not present

## 2018-07-03 DIAGNOSIS — J449 Chronic obstructive pulmonary disease, unspecified: Secondary | ICD-10-CM | POA: Diagnosis not present

## 2018-07-04 DIAGNOSIS — I10 Essential (primary) hypertension: Secondary | ICD-10-CM | POA: Diagnosis not present

## 2018-07-04 DIAGNOSIS — J449 Chronic obstructive pulmonary disease, unspecified: Secondary | ICD-10-CM | POA: Diagnosis not present

## 2018-07-04 DIAGNOSIS — Z6834 Body mass index (BMI) 34.0-34.9, adult: Secondary | ICD-10-CM | POA: Diagnosis not present

## 2018-07-04 DIAGNOSIS — E1165 Type 2 diabetes mellitus with hyperglycemia: Secondary | ICD-10-CM | POA: Diagnosis not present

## 2018-07-04 DIAGNOSIS — Z299 Encounter for prophylactic measures, unspecified: Secondary | ICD-10-CM | POA: Diagnosis not present

## 2018-07-04 DIAGNOSIS — F321 Major depressive disorder, single episode, moderate: Secondary | ICD-10-CM | POA: Diagnosis not present

## 2018-07-08 DIAGNOSIS — J441 Chronic obstructive pulmonary disease with (acute) exacerbation: Secondary | ICD-10-CM | POA: Diagnosis not present

## 2018-07-08 DIAGNOSIS — Z713 Dietary counseling and surveillance: Secondary | ICD-10-CM | POA: Diagnosis not present

## 2018-07-08 DIAGNOSIS — Z299 Encounter for prophylactic measures, unspecified: Secondary | ICD-10-CM | POA: Diagnosis not present

## 2018-07-16 DIAGNOSIS — Z299 Encounter for prophylactic measures, unspecified: Secondary | ICD-10-CM | POA: Diagnosis not present

## 2018-07-16 DIAGNOSIS — F321 Major depressive disorder, single episode, moderate: Secondary | ICD-10-CM | POA: Diagnosis not present

## 2018-07-16 DIAGNOSIS — J209 Acute bronchitis, unspecified: Secondary | ICD-10-CM | POA: Diagnosis not present

## 2018-07-16 DIAGNOSIS — J440 Chronic obstructive pulmonary disease with acute lower respiratory infection: Secondary | ICD-10-CM | POA: Diagnosis not present

## 2018-07-16 DIAGNOSIS — I10 Essential (primary) hypertension: Secondary | ICD-10-CM | POA: Diagnosis not present

## 2018-07-25 DIAGNOSIS — E119 Type 2 diabetes mellitus without complications: Secondary | ICD-10-CM | POA: Diagnosis not present

## 2018-07-25 DIAGNOSIS — J449 Chronic obstructive pulmonary disease, unspecified: Secondary | ICD-10-CM | POA: Diagnosis not present

## 2018-07-25 DIAGNOSIS — E7800 Pure hypercholesterolemia, unspecified: Secondary | ICD-10-CM | POA: Diagnosis not present

## 2018-07-25 DIAGNOSIS — I10 Essential (primary) hypertension: Secondary | ICD-10-CM | POA: Diagnosis not present

## 2018-08-02 DIAGNOSIS — I10 Essential (primary) hypertension: Secondary | ICD-10-CM | POA: Diagnosis not present

## 2018-08-02 DIAGNOSIS — J441 Chronic obstructive pulmonary disease with (acute) exacerbation: Secondary | ICD-10-CM | POA: Diagnosis not present

## 2018-08-02 DIAGNOSIS — Z299 Encounter for prophylactic measures, unspecified: Secondary | ICD-10-CM | POA: Diagnosis not present

## 2018-08-02 DIAGNOSIS — E1165 Type 2 diabetes mellitus with hyperglycemia: Secondary | ICD-10-CM | POA: Diagnosis not present

## 2018-08-02 DIAGNOSIS — J449 Chronic obstructive pulmonary disease, unspecified: Secondary | ICD-10-CM | POA: Diagnosis not present

## 2018-08-28 DIAGNOSIS — E119 Type 2 diabetes mellitus without complications: Secondary | ICD-10-CM | POA: Diagnosis not present

## 2018-08-28 DIAGNOSIS — J449 Chronic obstructive pulmonary disease, unspecified: Secondary | ICD-10-CM | POA: Diagnosis not present

## 2018-08-28 DIAGNOSIS — I10 Essential (primary) hypertension: Secondary | ICD-10-CM | POA: Diagnosis not present

## 2018-08-28 DIAGNOSIS — E7800 Pure hypercholesterolemia, unspecified: Secondary | ICD-10-CM | POA: Diagnosis not present

## 2018-08-29 IMAGING — NM NM MYOCAR MULTI W/SPECT W/WALL MOTION & EF
2 series · 12 of 12 positions shown · non-contrast
Comparison: none

[Series 1: rest · 8.28mm/px · 6 of 64 frames shown]
[frame 6/64]
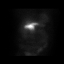
[frame 16/64]
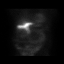
[frame 27/64]
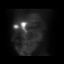
[frame 38/64]
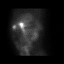
[frame 48/64]
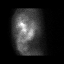
[frame 59/64]
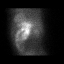

[Series 1: stress gated · 8.28mm/px · 6 of 64 frames shown]
[frame 6/64]
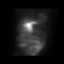
[frame 16/64]
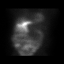
[frame 27/64]
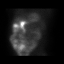
[frame 38/64]
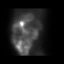
[frame 48/64]
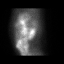
[frame 59/64]
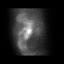

[12 of 12 positions shown; findings below may reference images not displayed]

Canned report from images found in remote index.

Refer to host system for actual result text.

## 2018-09-03 DIAGNOSIS — G40804 Other epilepsy, intractable, without status epilepticus: Secondary | ICD-10-CM | POA: Diagnosis not present

## 2018-09-03 DIAGNOSIS — G4733 Obstructive sleep apnea (adult) (pediatric): Secondary | ICD-10-CM | POA: Diagnosis not present

## 2018-09-03 DIAGNOSIS — D352 Benign neoplasm of pituitary gland: Secondary | ICD-10-CM | POA: Diagnosis not present

## 2018-09-03 DIAGNOSIS — E662 Morbid (severe) obesity with alveolar hypoventilation: Secondary | ICD-10-CM | POA: Diagnosis not present

## 2018-09-13 DIAGNOSIS — J309 Allergic rhinitis, unspecified: Secondary | ICD-10-CM | POA: Diagnosis not present

## 2018-09-13 DIAGNOSIS — F321 Major depressive disorder, single episode, moderate: Secondary | ICD-10-CM | POA: Diagnosis not present

## 2018-09-13 DIAGNOSIS — Z6834 Body mass index (BMI) 34.0-34.9, adult: Secondary | ICD-10-CM | POA: Diagnosis not present

## 2018-09-13 DIAGNOSIS — E1165 Type 2 diabetes mellitus with hyperglycemia: Secondary | ICD-10-CM | POA: Diagnosis not present

## 2018-09-13 DIAGNOSIS — Z299 Encounter for prophylactic measures, unspecified: Secondary | ICD-10-CM | POA: Diagnosis not present

## 2018-09-13 DIAGNOSIS — F419 Anxiety disorder, unspecified: Secondary | ICD-10-CM | POA: Diagnosis not present

## 2018-09-13 DIAGNOSIS — Z87891 Personal history of nicotine dependence: Secondary | ICD-10-CM | POA: Diagnosis not present

## 2018-09-16 ENCOUNTER — Other Ambulatory Visit (HOSPITAL_BASED_OUTPATIENT_CLINIC_OR_DEPARTMENT_OTHER): Payer: Self-pay

## 2018-09-16 DIAGNOSIS — G4733 Obstructive sleep apnea (adult) (pediatric): Secondary | ICD-10-CM

## 2018-09-27 DIAGNOSIS — J449 Chronic obstructive pulmonary disease, unspecified: Secondary | ICD-10-CM | POA: Diagnosis not present

## 2018-09-27 DIAGNOSIS — E7800 Pure hypercholesterolemia, unspecified: Secondary | ICD-10-CM | POA: Diagnosis not present

## 2018-09-27 DIAGNOSIS — E119 Type 2 diabetes mellitus without complications: Secondary | ICD-10-CM | POA: Diagnosis not present

## 2018-09-27 DIAGNOSIS — I10 Essential (primary) hypertension: Secondary | ICD-10-CM | POA: Diagnosis not present

## 2018-09-30 ENCOUNTER — Other Ambulatory Visit: Payer: Medicare PPO

## 2018-09-30 ENCOUNTER — Other Ambulatory Visit: Payer: Self-pay

## 2018-09-30 DIAGNOSIS — R6889 Other general symptoms and signs: Secondary | ICD-10-CM | POA: Diagnosis not present

## 2018-09-30 DIAGNOSIS — Z20822 Contact with and (suspected) exposure to covid-19: Secondary | ICD-10-CM

## 2018-10-01 DIAGNOSIS — L299 Pruritus, unspecified: Secondary | ICD-10-CM | POA: Diagnosis not present

## 2018-10-01 DIAGNOSIS — J449 Chronic obstructive pulmonary disease, unspecified: Secondary | ICD-10-CM | POA: Diagnosis not present

## 2018-10-01 DIAGNOSIS — I10 Essential (primary) hypertension: Secondary | ICD-10-CM | POA: Diagnosis not present

## 2018-10-01 DIAGNOSIS — Z299 Encounter for prophylactic measures, unspecified: Secondary | ICD-10-CM | POA: Diagnosis not present

## 2018-10-01 DIAGNOSIS — Z6835 Body mass index (BMI) 35.0-35.9, adult: Secondary | ICD-10-CM | POA: Diagnosis not present

## 2018-10-01 DIAGNOSIS — L853 Xerosis cutis: Secondary | ICD-10-CM | POA: Diagnosis not present

## 2018-10-01 DIAGNOSIS — F321 Major depressive disorder, single episode, moderate: Secondary | ICD-10-CM | POA: Diagnosis not present

## 2018-10-02 LAB — NOVEL CORONAVIRUS, NAA: SARS-CoV-2, NAA: NOT DETECTED

## 2018-10-09 DIAGNOSIS — R569 Unspecified convulsions: Secondary | ICD-10-CM | POA: Diagnosis not present

## 2018-10-09 DIAGNOSIS — Z299 Encounter for prophylactic measures, unspecified: Secondary | ICD-10-CM | POA: Diagnosis not present

## 2018-10-09 DIAGNOSIS — J449 Chronic obstructive pulmonary disease, unspecified: Secondary | ICD-10-CM | POA: Diagnosis not present

## 2018-10-09 DIAGNOSIS — F321 Major depressive disorder, single episode, moderate: Secondary | ICD-10-CM | POA: Diagnosis not present

## 2018-10-09 DIAGNOSIS — E1165 Type 2 diabetes mellitus with hyperglycemia: Secondary | ICD-10-CM | POA: Diagnosis not present

## 2018-10-09 DIAGNOSIS — Z6835 Body mass index (BMI) 35.0-35.9, adult: Secondary | ICD-10-CM | POA: Diagnosis not present

## 2018-10-10 ENCOUNTER — Other Ambulatory Visit (HOSPITAL_BASED_OUTPATIENT_CLINIC_OR_DEPARTMENT_OTHER): Payer: Self-pay

## 2018-10-14 ENCOUNTER — Other Ambulatory Visit (HOSPITAL_COMMUNITY)
Admission: RE | Admit: 2018-10-14 | Discharge: 2018-10-14 | Disposition: A | Discharge status: Home / Self Care | Payer: Medicare PPO | Source: Ambulatory Visit | Attending: Neurology | Admitting: Neurology

## 2018-10-14 DIAGNOSIS — Z01812 Encounter for preprocedural laboratory examination: Secondary | ICD-10-CM | POA: Insufficient documentation

## 2018-10-14 DIAGNOSIS — Z1159 Encounter for screening for other viral diseases: Secondary | ICD-10-CM | POA: Diagnosis not present

## 2018-10-15 LAB — NOVEL CORONAVIRUS, NAA (HOSP ORDER, SEND-OUT TO REF LAB; TAT 18-24 HRS): SARS-CoV-2, NAA: NOT DETECTED

## 2018-10-16 ENCOUNTER — Other Ambulatory Visit: Payer: Self-pay

## 2018-10-16 ENCOUNTER — Ambulatory Visit: Payer: Medicare PPO | Attending: Neurology | Admitting: Neurology

## 2018-10-16 DIAGNOSIS — G4733 Obstructive sleep apnea (adult) (pediatric): Secondary | ICD-10-CM | POA: Diagnosis not present

## 2018-10-16 DIAGNOSIS — Z79899 Other long term (current) drug therapy: Secondary | ICD-10-CM | POA: Insufficient documentation

## 2018-10-16 DIAGNOSIS — Z7952 Long term (current) use of systemic steroids: Secondary | ICD-10-CM | POA: Insufficient documentation

## 2018-10-16 DIAGNOSIS — Z7902 Long term (current) use of antithrombotics/antiplatelets: Secondary | ICD-10-CM | POA: Insufficient documentation

## 2018-10-24 NOTE — Procedures (Signed)
Arnold A. Merlene Laughter, MD     [email or web address]             NOCTURNAL POLYSOMNOGRAPHY   LOCATION: [facility]    Patient Name: Derrick Bender, Derrick Bender Date: 10/16/2018 Gender: Male D.O.B: 1945/06/08 Age (years): 37 Referring Provider: Phillips Odor MD, ABSM Height (inches): 69 Interpreting Physician: Phillips Odor MD, ABSM Weight (lbs): 227 RPSGT: Peak, Robert BMI: 34 MRN: [id number] Neck Size: 18.50 CLINICAL INFORMATION Sleep Study Type: NPSG     Indication for sleep study: N/A     Epworth Sleepiness Score: 3     SLEEP STUDY TECHNIQUE As per the AASM Manual for the Scoring of Sleep and Associated Events v2.3 (April 2016) with a hypopnea requiring 4% desaturations.  The channels recorded and monitored were frontal, central and occipital EEG, electrooculogram (EOG), submentalis EMG (chin), nasal and oral airflow, thoracic and abdominal wall motion, anterior tibialis EMG, snore microphone, electrocardiogram, and pulse oximetry.  MEDICATIONS  Current Outpatient Medications:  .  albuterol (PROAIR HFA) 108 (90 Base) MCG/ACT inhaler, Inhale 2 puffs into the lungs every 6 (six) hours as needed., Disp: , Rfl:  .  clopidogrel (PLAVIX) 75 MG tablet, Take 75 mg by mouth daily., Disp: , Rfl:  .  diazepam (VALIUM) 5 MG tablet, Take 5 mg by mouth 2 (two) times daily as needed., Disp: , Rfl:  .  diltiazem (CARDIZEM CD) 180 MG 24 hr capsule, Take 180 mg by mouth daily., Disp: , Rfl:  .  folic acid (FOLVITE) 1 MG tablet, Take 1 tablet by mouth daily., Disp: , Rfl:  .  lisinopril (PRINIVIL,ZESTRIL) 20 MG tablet, Take 20 mg by mouth daily., Disp: , Rfl:  .  omeprazole (PRILOSEC) 40 MG capsule, Take 1 capsule by mouth daily., Disp: , Rfl:  .  oxybutynin (DITROPAN) 5 MG tablet, Take 1 tablet by mouth 3 (three) times daily., Disp: , Rfl:  .  PARoxetine (PAXIL) 40 MG tablet, Take 20 mg by mouth daily., Disp: , Rfl:  .  phenytoin (DILANTIN) 100 MG ER capsule, Take 100  mg by mouth 3 (three) times daily., Disp: , Rfl:  .  predniSONE (DELTASONE) 10 MG tablet, Take 10 mg by mouth daily., Disp: , Rfl:  .  simvastatin (ZOCOR) 20 MG tablet, Take 20 mg by mouth at bedtime., Disp: , Rfl:  .  Testosterone 12.5 MG/ACT (1%) GEL, Apply 1.25 g topically daily., Disp: , Rfl: 4 .  topiramate (TOPAMAX) 100 MG tablet, Take 100 mg by mouth 2 (two) times daily., Disp: , Rfl:       SLEEP ARCHITECTURE The study was initiated at 10:25:46 PM and ended at 4:45:23 AM.  Sleep onset time was 40.4 minutes and the sleep efficiency was 78.0%%. The total sleep time was 296 minutes.  Stage REM latency was N/A minutes.  The patient spent 8.6%% of the night in stage N1 sleep, 89.9%% in stage N2 sleep, 1.5%% in stage N3 and 0% in REM.  Alpha intrusion was absent.  Supine sleep was 100.00%.  RESPIRATORY PARAMETERS The overall apnea/hypopnea index (AHI) was 42.8 per hour. There were 37 total apneas, including 18 obstructive, 19 central and 0 mixed apneas. There were 174 hypopneas and 11 RERAs.  The AHI during Stage REM sleep was N/A per hour.  AHI while supine was 42.8 per hour.  The mean oxygen saturation was 95.5%. The minimum SpO2 during sleep was 89.0%.  moderate snoring was noted during this study.  CARDIAC DATA The 2 lead EKG demonstrated sinus rhythm.  The mean heart rate was 64.0 beats per minute. Other EKG findings include: None.  LEG MOVEMENT DATA The total PLMS were 0 with a resulting PLMS index of 0.0. Associated arousal with leg movement index was 0.0.  IMPRESSIONS 1. Severe obstructive sleep apnea is documented with this study.  AutoPAP 8-14 is recommended. 2. Abnormal sleep architecture is also noted with the absent REM sleep and the markedly reduced slow-wave sleep.   Delano Metz, MD Diplomate, American Board of Sleep Medicine.  ELECTRONICALLY SIGNED ON:  10/24/2018, 5:32 PM [place] PH: [phone]   FX: [phone]  [place]

## 2018-10-28 DIAGNOSIS — E7800 Pure hypercholesterolemia, unspecified: Secondary | ICD-10-CM | POA: Diagnosis not present

## 2018-10-28 DIAGNOSIS — E119 Type 2 diabetes mellitus without complications: Secondary | ICD-10-CM | POA: Diagnosis not present

## 2018-10-28 DIAGNOSIS — I10 Essential (primary) hypertension: Secondary | ICD-10-CM | POA: Diagnosis not present

## 2018-10-28 DIAGNOSIS — J449 Chronic obstructive pulmonary disease, unspecified: Secondary | ICD-10-CM | POA: Diagnosis not present

## 2018-10-30 DIAGNOSIS — G40804 Other epilepsy, intractable, without status epilepticus: Secondary | ICD-10-CM | POA: Diagnosis not present

## 2018-10-30 DIAGNOSIS — G4733 Obstructive sleep apnea (adult) (pediatric): Secondary | ICD-10-CM | POA: Diagnosis not present

## 2018-10-30 DIAGNOSIS — D352 Benign neoplasm of pituitary gland: Secondary | ICD-10-CM | POA: Diagnosis not present

## 2018-10-30 DIAGNOSIS — E662 Morbid (severe) obesity with alveolar hypoventilation: Secondary | ICD-10-CM | POA: Diagnosis not present

## 2018-11-12 DIAGNOSIS — F321 Major depressive disorder, single episode, moderate: Secondary | ICD-10-CM | POA: Diagnosis not present

## 2018-11-12 DIAGNOSIS — Z299 Encounter for prophylactic measures, unspecified: Secondary | ICD-10-CM | POA: Diagnosis not present

## 2018-11-12 DIAGNOSIS — L299 Pruritus, unspecified: Secondary | ICD-10-CM | POA: Diagnosis not present

## 2018-11-12 DIAGNOSIS — Z87891 Personal history of nicotine dependence: Secondary | ICD-10-CM | POA: Diagnosis not present

## 2018-11-12 DIAGNOSIS — Z6835 Body mass index (BMI) 35.0-35.9, adult: Secondary | ICD-10-CM | POA: Diagnosis not present

## 2018-11-12 DIAGNOSIS — E1165 Type 2 diabetes mellitus with hyperglycemia: Secondary | ICD-10-CM | POA: Diagnosis not present

## 2018-11-12 DIAGNOSIS — J449 Chronic obstructive pulmonary disease, unspecified: Secondary | ICD-10-CM | POA: Diagnosis not present

## 2018-11-13 DIAGNOSIS — G4733 Obstructive sleep apnea (adult) (pediatric): Secondary | ICD-10-CM | POA: Diagnosis not present

## 2018-11-25 DIAGNOSIS — E7800 Pure hypercholesterolemia, unspecified: Secondary | ICD-10-CM | POA: Diagnosis not present

## 2018-11-25 DIAGNOSIS — E119 Type 2 diabetes mellitus without complications: Secondary | ICD-10-CM | POA: Diagnosis not present

## 2018-12-03 DIAGNOSIS — E1165 Type 2 diabetes mellitus with hyperglycemia: Secondary | ICD-10-CM | POA: Diagnosis not present

## 2018-12-03 DIAGNOSIS — Z299 Encounter for prophylactic measures, unspecified: Secondary | ICD-10-CM | POA: Diagnosis not present

## 2018-12-03 DIAGNOSIS — Z6835 Body mass index (BMI) 35.0-35.9, adult: Secondary | ICD-10-CM | POA: Diagnosis not present

## 2018-12-03 DIAGNOSIS — J209 Acute bronchitis, unspecified: Secondary | ICD-10-CM | POA: Diagnosis not present

## 2018-12-03 DIAGNOSIS — J440 Chronic obstructive pulmonary disease with acute lower respiratory infection: Secondary | ICD-10-CM | POA: Diagnosis not present

## 2018-12-03 DIAGNOSIS — I639 Cerebral infarction, unspecified: Secondary | ICD-10-CM | POA: Diagnosis not present

## 2018-12-05 DIAGNOSIS — Z6835 Body mass index (BMI) 35.0-35.9, adult: Secondary | ICD-10-CM | POA: Diagnosis not present

## 2018-12-05 DIAGNOSIS — J209 Acute bronchitis, unspecified: Secondary | ICD-10-CM | POA: Diagnosis not present

## 2018-12-05 DIAGNOSIS — I10 Essential (primary) hypertension: Secondary | ICD-10-CM | POA: Diagnosis not present

## 2018-12-05 DIAGNOSIS — J440 Chronic obstructive pulmonary disease with acute lower respiratory infection: Secondary | ICD-10-CM | POA: Diagnosis not present

## 2018-12-05 DIAGNOSIS — Z299 Encounter for prophylactic measures, unspecified: Secondary | ICD-10-CM | POA: Diagnosis not present

## 2018-12-05 DIAGNOSIS — J441 Chronic obstructive pulmonary disease with (acute) exacerbation: Secondary | ICD-10-CM | POA: Diagnosis not present

## 2018-12-06 DIAGNOSIS — R0902 Hypoxemia: Secondary | ICD-10-CM | POA: Diagnosis not present

## 2018-12-06 DIAGNOSIS — R509 Fever, unspecified: Secondary | ICD-10-CM | POA: Diagnosis not present

## 2018-12-06 DIAGNOSIS — J440 Chronic obstructive pulmonary disease with acute lower respiratory infection: Secondary | ICD-10-CM | POA: Diagnosis not present

## 2018-12-06 DIAGNOSIS — I10 Essential (primary) hypertension: Secondary | ICD-10-CM | POA: Diagnosis not present

## 2018-12-06 DIAGNOSIS — E876 Hypokalemia: Secondary | ICD-10-CM | POA: Diagnosis not present

## 2018-12-06 DIAGNOSIS — J1289 Other viral pneumonia: Secondary | ICD-10-CM | POA: Diagnosis not present

## 2018-12-06 DIAGNOSIS — R0602 Shortness of breath: Secondary | ICD-10-CM | POA: Diagnosis not present

## 2018-12-06 DIAGNOSIS — R0603 Acute respiratory distress: Secondary | ICD-10-CM | POA: Diagnosis not present

## 2018-12-06 DIAGNOSIS — K219 Gastro-esophageal reflux disease without esophagitis: Secondary | ICD-10-CM | POA: Diagnosis not present

## 2018-12-06 DIAGNOSIS — J441 Chronic obstructive pulmonary disease with (acute) exacerbation: Secondary | ICD-10-CM | POA: Diagnosis not present

## 2018-12-06 DIAGNOSIS — U071 COVID-19: Secondary | ICD-10-CM | POA: Diagnosis not present

## 2018-12-06 DIAGNOSIS — E1165 Type 2 diabetes mellitus with hyperglycemia: Secondary | ICD-10-CM | POA: Diagnosis not present

## 2018-12-09 ENCOUNTER — Inpatient Hospital Stay (HOSPITAL_COMMUNITY)
Admission: AD | Admit: 2018-12-09 | Discharge: 2018-12-22 | DRG: 177 | Disposition: A | Discharge status: Home Health | Payer: Medicare PPO | Source: Other Acute Inpatient Hospital | Attending: Internal Medicine | Admitting: Internal Medicine

## 2018-12-09 DIAGNOSIS — J449 Chronic obstructive pulmonary disease, unspecified: Secondary | ICD-10-CM | POA: Diagnosis not present

## 2018-12-09 DIAGNOSIS — E119 Type 2 diabetes mellitus without complications: Secondary | ICD-10-CM | POA: Diagnosis present

## 2018-12-09 DIAGNOSIS — K649 Unspecified hemorrhoids: Secondary | ICD-10-CM | POA: Diagnosis present

## 2018-12-09 DIAGNOSIS — Z882 Allergy status to sulfonamides status: Secondary | ICD-10-CM

## 2018-12-09 DIAGNOSIS — R0602 Shortness of breath: Secondary | ICD-10-CM

## 2018-12-09 DIAGNOSIS — J159 Unspecified bacterial pneumonia: Secondary | ICD-10-CM | POA: Diagnosis present

## 2018-12-09 DIAGNOSIS — R32 Unspecified urinary incontinence: Secondary | ICD-10-CM | POA: Diagnosis not present

## 2018-12-09 DIAGNOSIS — N179 Acute kidney failure, unspecified: Secondary | ICD-10-CM | POA: Diagnosis present

## 2018-12-09 DIAGNOSIS — R918 Other nonspecific abnormal finding of lung field: Secondary | ICD-10-CM | POA: Diagnosis not present

## 2018-12-09 DIAGNOSIS — Z79899 Other long term (current) drug therapy: Secondary | ICD-10-CM

## 2018-12-09 DIAGNOSIS — J441 Chronic obstructive pulmonary disease with (acute) exacerbation: Secondary | ICD-10-CM | POA: Diagnosis not present

## 2018-12-09 DIAGNOSIS — I110 Hypertensive heart disease with heart failure: Secondary | ICD-10-CM | POA: Diagnosis present

## 2018-12-09 DIAGNOSIS — E785 Hyperlipidemia, unspecified: Secondary | ICD-10-CM | POA: Diagnosis present

## 2018-12-09 DIAGNOSIS — J9601 Acute respiratory failure with hypoxia: Secondary | ICD-10-CM | POA: Diagnosis not present

## 2018-12-09 DIAGNOSIS — I509 Heart failure, unspecified: Secondary | ICD-10-CM | POA: Diagnosis not present

## 2018-12-09 DIAGNOSIS — E860 Dehydration: Secondary | ICD-10-CM | POA: Diagnosis not present

## 2018-12-09 DIAGNOSIS — E669 Obesity, unspecified: Secondary | ICD-10-CM | POA: Diagnosis present

## 2018-12-09 DIAGNOSIS — J440 Chronic obstructive pulmonary disease with acute lower respiratory infection: Secondary | ICD-10-CM | POA: Diagnosis not present

## 2018-12-09 DIAGNOSIS — G4733 Obstructive sleep apnea (adult) (pediatric): Secondary | ICD-10-CM | POA: Diagnosis present

## 2018-12-09 DIAGNOSIS — E237 Disorder of pituitary gland, unspecified: Secondary | ICD-10-CM | POA: Diagnosis present

## 2018-12-09 DIAGNOSIS — R197 Diarrhea, unspecified: Secondary | ICD-10-CM | POA: Diagnosis not present

## 2018-12-09 DIAGNOSIS — J690 Pneumonitis due to inhalation of food and vomit: Secondary | ICD-10-CM | POA: Diagnosis not present

## 2018-12-09 DIAGNOSIS — E876 Hypokalemia: Secondary | ICD-10-CM | POA: Diagnosis not present

## 2018-12-09 DIAGNOSIS — I639 Cerebral infarction, unspecified: Secondary | ICD-10-CM | POA: Diagnosis not present

## 2018-12-09 DIAGNOSIS — Z20828 Contact with and (suspected) exposure to other viral communicable diseases: Secondary | ICD-10-CM | POA: Diagnosis not present

## 2018-12-09 DIAGNOSIS — G40909 Epilepsy, unspecified, not intractable, without status epilepticus: Secondary | ICD-10-CM | POA: Diagnosis present

## 2018-12-09 DIAGNOSIS — Z9079 Acquired absence of other genital organ(s): Secondary | ICD-10-CM

## 2018-12-09 DIAGNOSIS — Z87891 Personal history of nicotine dependence: Secondary | ICD-10-CM

## 2018-12-09 DIAGNOSIS — Z6832 Body mass index (BMI) 32.0-32.9, adult: Secondary | ICD-10-CM

## 2018-12-09 DIAGNOSIS — U071 COVID-19: Principal | ICD-10-CM | POA: Diagnosis present

## 2018-12-09 DIAGNOSIS — I5033 Acute on chronic diastolic (congestive) heart failure: Secondary | ICD-10-CM | POA: Diagnosis not present

## 2018-12-09 DIAGNOSIS — Z8673 Personal history of transient ischemic attack (TIA), and cerebral infarction without residual deficits: Secondary | ICD-10-CM

## 2018-12-09 DIAGNOSIS — Z7902 Long term (current) use of antithrombotics/antiplatelets: Secondary | ICD-10-CM

## 2018-12-09 DIAGNOSIS — Z8546 Personal history of malignant neoplasm of prostate: Secondary | ICD-10-CM

## 2018-12-09 DIAGNOSIS — I10 Essential (primary) hypertension: Secondary | ICD-10-CM | POA: Diagnosis present

## 2018-12-09 DIAGNOSIS — F32A Depression, unspecified: Secondary | ICD-10-CM | POA: Diagnosis present

## 2018-12-09 DIAGNOSIS — R1310 Dysphagia, unspecified: Secondary | ICD-10-CM | POA: Diagnosis not present

## 2018-12-09 DIAGNOSIS — J1289 Other viral pneumonia: Secondary | ICD-10-CM | POA: Diagnosis not present

## 2018-12-09 DIAGNOSIS — R569 Unspecified convulsions: Secondary | ICD-10-CM

## 2018-12-09 DIAGNOSIS — F329 Major depressive disorder, single episode, unspecified: Secondary | ICD-10-CM | POA: Diagnosis present

## 2018-12-09 DIAGNOSIS — J181 Lobar pneumonia, unspecified organism: Secondary | ICD-10-CM | POA: Diagnosis not present

## 2018-12-09 DIAGNOSIS — Z7952 Long term (current) use of systemic steroids: Secondary | ICD-10-CM

## 2018-12-09 DIAGNOSIS — J1282 Pneumonia due to coronavirus disease 2019: Secondary | ICD-10-CM | POA: Diagnosis present

## 2018-12-09 DIAGNOSIS — R509 Fever, unspecified: Secondary | ICD-10-CM | POA: Diagnosis not present

## 2018-12-10 ENCOUNTER — Other Ambulatory Visit: Payer: Self-pay

## 2018-12-10 ENCOUNTER — Inpatient Hospital Stay (HOSPITAL_COMMUNITY): Payer: Medicare PPO

## 2018-12-10 ENCOUNTER — Encounter (HOSPITAL_COMMUNITY): Payer: Self-pay

## 2018-12-10 DIAGNOSIS — J449 Chronic obstructive pulmonary disease, unspecified: Secondary | ICD-10-CM | POA: Diagnosis present

## 2018-12-10 DIAGNOSIS — U071 COVID-19: Secondary | ICD-10-CM | POA: Diagnosis present

## 2018-12-10 DIAGNOSIS — E785 Hyperlipidemia, unspecified: Secondary | ICD-10-CM | POA: Diagnosis present

## 2018-12-10 DIAGNOSIS — J1282 Pneumonia due to coronavirus disease 2019: Secondary | ICD-10-CM | POA: Diagnosis present

## 2018-12-10 DIAGNOSIS — R569 Unspecified convulsions: Secondary | ICD-10-CM

## 2018-12-10 DIAGNOSIS — F32A Depression, unspecified: Secondary | ICD-10-CM | POA: Diagnosis present

## 2018-12-10 DIAGNOSIS — E119 Type 2 diabetes mellitus without complications: Secondary | ICD-10-CM

## 2018-12-10 DIAGNOSIS — F329 Major depressive disorder, single episode, unspecified: Secondary | ICD-10-CM | POA: Diagnosis present

## 2018-12-10 DIAGNOSIS — I10 Essential (primary) hypertension: Secondary | ICD-10-CM | POA: Diagnosis present

## 2018-12-10 LAB — SEDIMENTATION RATE: Sed Rate: 38 mm/hr — ABNORMAL HIGH (ref 0–16)

## 2018-12-10 LAB — CBC WITH DIFFERENTIAL/PLATELET
Abs Immature Granulocytes: 0.02 10*3/uL (ref 0.00–0.07)
Basophils Absolute: 0 10*3/uL (ref 0.0–0.1)
Basophils Relative: 0 %
Eosinophils Absolute: 0 10*3/uL (ref 0.0–0.5)
Eosinophils Relative: 0 %
HCT: 38.3 % — ABNORMAL LOW (ref 39.0–52.0)
Hemoglobin: 12.1 g/dL — ABNORMAL LOW (ref 13.0–17.0)
Immature Granulocytes: 0 %
Lymphocytes Relative: 24 %
Lymphs Abs: 1.1 10*3/uL (ref 0.7–4.0)
MCH: 27.9 pg (ref 26.0–34.0)
MCHC: 31.6 g/dL (ref 30.0–36.0)
MCV: 88.2 fL (ref 80.0–100.0)
Monocytes Absolute: 0.5 10*3/uL (ref 0.1–1.0)
Monocytes Relative: 12 %
Neutro Abs: 2.9 10*3/uL (ref 1.7–7.7)
Neutrophils Relative %: 64 %
Platelets: 153 10*3/uL (ref 150–400)
RBC: 4.34 MIL/uL (ref 4.22–5.81)
RDW: 14.8 % (ref 11.5–15.5)
WBC: 4.5 10*3/uL (ref 4.0–10.5)
nRBC: 0 % (ref 0.0–0.2)

## 2018-12-10 LAB — PHOSPHORUS: Phosphorus: 2.4 mg/dL — ABNORMAL LOW (ref 2.5–4.6)

## 2018-12-10 LAB — COMPREHENSIVE METABOLIC PANEL
ALT: 57 U/L — ABNORMAL HIGH (ref 0–44)
AST: 109 U/L — ABNORMAL HIGH (ref 15–41)
Albumin: 2.7 g/dL — ABNORMAL LOW (ref 3.5–5.0)
Alkaline Phosphatase: 91 U/L (ref 38–126)
Anion gap: 9 (ref 5–15)
BUN: 28 mg/dL — ABNORMAL HIGH (ref 8–23)
CO2: 22 mmol/L (ref 22–32)
Calcium: 8 mg/dL — ABNORMAL LOW (ref 8.9–10.3)
Chloride: 108 mmol/L (ref 98–111)
Creatinine, Ser: 1.26 mg/dL — ABNORMAL HIGH (ref 0.61–1.24)
GFR calc Af Amer: >60 mL/min (ref >60)
GFR calc non Af Amer: 56 mL/min — ABNORMAL LOW (ref >60)
Glucose, Bld: 111 mg/dL — ABNORMAL HIGH (ref 70–99)
Potassium: 3.6 mmol/L (ref 3.5–5.1)
Sodium: 139 mmol/L (ref 135–145)
Total Bilirubin: 0.6 mg/dL (ref 0.3–1.2)
Total Protein: 6.3 g/dL — ABNORMAL LOW (ref 6.5–8.1)

## 2018-12-10 LAB — GLUCOSE, CAPILLARY
Glucose-Capillary: 96 mg/dL (ref 70–99)
Glucose-Capillary: 106 mg/dL — ABNORMAL HIGH (ref 70–99)
Glucose-Capillary: 147 mg/dL — ABNORMAL HIGH (ref 70–99)
Glucose-Capillary: 159 mg/dL — ABNORMAL HIGH (ref 70–99)

## 2018-12-10 LAB — MAGNESIUM: Magnesium: 1.9 mg/dL (ref 1.7–2.4)

## 2018-12-10 LAB — LACTATE DEHYDROGENASE: LDH: 510 U/L — ABNORMAL HIGH (ref 98–192)

## 2018-12-10 LAB — HEMOGLOBIN A1C
Hgb A1c MFr Bld: 6.5 % — ABNORMAL HIGH (ref 4.8–5.6)
Mean Plasma Glucose: 139.85 mg/dL

## 2018-12-10 LAB — BRAIN NATRIURETIC PEPTIDE: B Natriuretic Peptide: 36.3 pg/mL (ref 0.0–100.0)

## 2018-12-10 LAB — PHENYTOIN LEVEL, TOTAL: Phenytoin Lvl: <2.5 ug/mL — ABNORMAL LOW (ref 10.0–20.0)

## 2018-12-10 LAB — ABO/RH: ABO/RH(D): O POS

## 2018-12-10 LAB — FERRITIN: Ferritin: 230 ng/mL (ref 24–336)

## 2018-12-10 LAB — TRIGLYCERIDES: Triglycerides: 215 mg/dL — ABNORMAL HIGH (ref <150)

## 2018-12-10 LAB — C-REACTIVE PROTEIN: CRP: 14.2 mg/dL — ABNORMAL HIGH (ref <1.0)

## 2018-12-10 LAB — FIBRINOGEN: Fibrinogen: 769 mg/dL — ABNORMAL HIGH (ref 210–475)

## 2018-12-10 LAB — PROCALCITONIN: Procalcitonin: 4.64 ng/mL

## 2018-12-10 LAB — D-DIMER, QUANTITATIVE: D-Dimer, Quant: 0.69 ug/mL-FEU — ABNORMAL HIGH (ref 0.00–0.50)

## 2018-12-10 MED ORDER — INSULIN ASPART 100 UNIT/ML ~~LOC~~ SOLN
0.0000 [IU] | Freq: Three times a day (TID) | SUBCUTANEOUS | Status: DC
  Administered 2018-12-10: 17:00:00 1 [IU] via SUBCUTANEOUS
  Administered 2018-12-11 (×2): 2 [IU] via SUBCUTANEOUS
  Administered 2018-12-11 – 2018-12-12 (×2): 5 [IU] via SUBCUTANEOUS
  Administered 2018-12-12: 2 [IU] via SUBCUTANEOUS
  Administered 2018-12-12: 12:00:00 7 [IU] via SUBCUTANEOUS
  Administered 2018-12-13: 12:00:00 2 [IU] via SUBCUTANEOUS
  Administered 2018-12-13: 17:00:00 9 [IU] via SUBCUTANEOUS
  Administered 2018-12-14 (×2): 3 [IU] via SUBCUTANEOUS
  Administered 2018-12-15: 12:00:00 2 [IU] via SUBCUTANEOUS
  Administered 2018-12-15: 5 [IU] via SUBCUTANEOUS
  Administered 2018-12-16: 17:00:00 7 [IU] via SUBCUTANEOUS
  Administered 2018-12-16: 12:00:00 2 [IU] via SUBCUTANEOUS
  Administered 2018-12-17: 3 [IU] via SUBCUTANEOUS
  Administered 2018-12-17: 17:00:00 9 [IU] via SUBCUTANEOUS
  Administered 2018-12-18: 7 [IU] via SUBCUTANEOUS
  Administered 2018-12-18: 5 [IU] via SUBCUTANEOUS

## 2018-12-10 MED ORDER — ACETAMINOPHEN 325 MG PO TABS
650.0000 mg | ORAL_TABLET | Freq: Once | ORAL | Status: AC
  Administered 2018-12-10: 21:00:00 650 mg via ORAL
  Filled 2018-12-10: qty 2

## 2018-12-10 MED ORDER — PANTOPRAZOLE SODIUM 40 MG PO TBEC
40.0000 mg | DELAYED_RELEASE_TABLET | Freq: Every day | ORAL | Status: DC
  Administered 2018-12-10 – 2018-12-22 (×13): 40 mg via ORAL
  Filled 2018-12-10 (×13): qty 1

## 2018-12-10 MED ORDER — IPRATROPIUM-ALBUTEROL 20-100 MCG/ACT IN AERS
1.0000 | INHALATION_SPRAY | Freq: Four times a day (QID) | RESPIRATORY_TRACT | Status: DC
  Administered 2018-12-10 – 2018-12-22 (×48): 1 via RESPIRATORY_TRACT
  Filled 2018-12-10: qty 4

## 2018-12-10 MED ORDER — ALBUTEROL SULFATE HFA 108 (90 BASE) MCG/ACT IN AERS
2.0000 | INHALATION_SPRAY | Freq: Four times a day (QID) | RESPIRATORY_TRACT | Status: DC | PRN
  Administered 2018-12-10 – 2018-12-22 (×12): 2 via RESPIRATORY_TRACT
  Filled 2018-12-10: qty 6.7

## 2018-12-10 MED ORDER — ENOXAPARIN SODIUM 40 MG/0.4ML ~~LOC~~ SOLN: 40.0000 mg | SUBCUTANEOUS | Status: DC

## 2018-12-10 MED ORDER — PROCHLORPERAZINE EDISYLATE 10 MG/2ML IJ SOLN
5.0000 mg | INTRAMUSCULAR | Status: DC | PRN
  Filled 2018-12-10: qty 1

## 2018-12-10 MED ORDER — MOMETASONE FURO-FORMOTEROL FUM 200-5 MCG/ACT IN AERO
2.0000 | INHALATION_SPRAY | Freq: Two times a day (BID) | RESPIRATORY_TRACT | Status: DC
  Administered 2018-12-10 – 2018-12-22 (×24): 2 via RESPIRATORY_TRACT
  Filled 2018-12-10: qty 8.8

## 2018-12-10 MED ORDER — PHENYTOIN SODIUM EXTENDED 100 MG PO CAPS
100.0000 mg | ORAL_CAPSULE | Freq: Three times a day (TID) | ORAL | Status: DC
  Administered 2018-12-10 – 2018-12-22 (×37): 100 mg via ORAL
  Filled 2018-12-10 (×41): qty 1

## 2018-12-10 MED ORDER — ACETAMINOPHEN 325 MG PO TABS
650.0000 mg | ORAL_TABLET | Freq: Four times a day (QID) | ORAL | Status: DC | PRN
  Administered 2018-12-10 – 2018-12-20 (×17): 650 mg via ORAL
  Filled 2018-12-10 (×18): qty 2

## 2018-12-10 MED ORDER — DILTIAZEM HCL ER COATED BEADS 180 MG PO CP24
180.0000 mg | ORAL_CAPSULE | Freq: Every day | ORAL | Status: DC
  Administered 2018-12-10 – 2018-12-14 (×5): 180 mg via ORAL
  Filled 2018-12-10 (×5): qty 1

## 2018-12-10 MED ORDER — BENZONATATE 100 MG PO CAPS
200.0000 mg | ORAL_CAPSULE | Freq: Three times a day (TID) | ORAL | Status: DC
  Administered 2018-12-10 – 2018-12-20 (×33): 200 mg via ORAL
  Filled 2018-12-10 (×32): qty 2

## 2018-12-10 MED ORDER — TOPIRAMATE 100 MG PO TABS
100.0000 mg | ORAL_TABLET | Freq: Two times a day (BID) | ORAL | Status: DC
  Administered 2018-12-10 – 2018-12-22 (×25): 100 mg via ORAL
  Filled 2018-12-10 (×27): qty 1

## 2018-12-10 MED ORDER — CLOPIDOGREL BISULFATE 75 MG PO TABS
75.0000 mg | ORAL_TABLET | Freq: Every day | ORAL | Status: DC
  Administered 2018-12-10 – 2018-12-22 (×13): 75 mg via ORAL
  Filled 2018-12-10 (×13): qty 1

## 2018-12-10 MED ORDER — LISINOPRIL 20 MG PO TABS
20.0000 mg | ORAL_TABLET | Freq: Every day | ORAL | Status: DC
  Administered 2018-12-10 – 2018-12-16 (×7): 20 mg via ORAL
  Filled 2018-12-10 (×7): qty 1

## 2018-12-10 MED ORDER — AZITHROMYCIN 250 MG PO TABS
500.0000 mg | ORAL_TABLET | Freq: Every day | ORAL | Status: AC
  Administered 2018-12-10 – 2018-12-14 (×5): 500 mg via ORAL
  Filled 2018-12-10 (×6): qty 2

## 2018-12-10 MED ORDER — SODIUM CHLORIDE 0.9 % IV SOLN
100.0000 mg | INTRAVENOUS | Status: AC
  Administered 2018-12-11 – 2018-12-14 (×4): 100 mg via INTRAVENOUS
  Filled 2018-12-10 (×4): qty 20

## 2018-12-10 MED ORDER — SODIUM CHLORIDE 0.9 % IV SOLN
200.0000 mg | Freq: Once | INTRAVENOUS | Status: AC
  Administered 2018-12-10: 12:00:00 200 mg via INTRAVENOUS
  Filled 2018-12-10: qty 40

## 2018-12-10 MED ORDER — ESCITALOPRAM OXALATE 10 MG PO TABS
20.0000 mg | ORAL_TABLET | Freq: Every day | ORAL | Status: DC
  Administered 2018-12-10 – 2018-12-21 (×12): 20 mg via ORAL
  Filled 2018-12-10 (×13): qty 2

## 2018-12-10 MED ORDER — INSULIN ASPART 100 UNIT/ML ~~LOC~~ SOLN: 0.0000 [IU] | Freq: Three times a day (TID) | SUBCUTANEOUS | Status: DC

## 2018-12-10 MED ORDER — DIAZEPAM 5 MG PO TABS
5.0000 mg | ORAL_TABLET | Freq: Two times a day (BID) | ORAL | Status: DC | PRN
  Administered 2018-12-13 – 2018-12-15 (×3): 5 mg via ORAL
  Filled 2018-12-10 (×3): qty 1

## 2018-12-10 MED ORDER — MAGNESIUM SULFATE 2 GM/50ML IV SOLN
2.0000 g | Freq: Once | INTRAVENOUS | Status: AC
  Administered 2018-12-10: 12:00:00 2 g via INTRAVENOUS
  Filled 2018-12-10: qty 50

## 2018-12-10 MED ORDER — ENOXAPARIN SODIUM 40 MG/0.4ML ~~LOC~~ SOLN
40.0000 mg | SUBCUTANEOUS | Status: DC
  Administered 2018-12-10 – 2018-12-22 (×13): 40 mg via SUBCUTANEOUS
  Filled 2018-12-10 (×13): qty 0.4

## 2018-12-10 MED ORDER — SODIUM CHLORIDE 0.9 % IV BOLUS
500.0000 mL | Freq: Once | INTRAVENOUS | Status: AC
  Administered 2018-12-10: 22:00:00 500 mL via INTRAVENOUS

## 2018-12-10 MED ORDER — OXYBUTYNIN CHLORIDE 5 MG PO TABS
5.0000 mg | ORAL_TABLET | Freq: Three times a day (TID) | ORAL | Status: DC
  Administered 2018-12-10 – 2018-12-22 (×36): 5 mg via ORAL
  Filled 2018-12-10 (×40): qty 1

## 2018-12-10 MED ORDER — DEXAMETHASONE SODIUM PHOSPHATE 10 MG/ML IJ SOLN: 6.0000 mg | INTRAMUSCULAR | Status: DC

## 2018-12-10 MED ORDER — HYDROCOD POLST-CPM POLST ER 10-8 MG/5ML PO SUER
5.0000 mL | Freq: Two times a day (BID) | ORAL | Status: DC | PRN
  Administered 2018-12-13 – 2018-12-18 (×2): 5 mL via ORAL
  Filled 2018-12-10 (×2): qty 5

## 2018-12-10 MED ORDER — METHYLPREDNISOLONE SODIUM SUCC 40 MG IJ SOLR
40.0000 mg | Freq: Two times a day (BID) | INTRAMUSCULAR | Status: DC
  Administered 2018-12-10 – 2018-12-12 (×5): 40 mg via INTRAVENOUS
  Filled 2018-12-10 (×5): qty 1

## 2018-12-10 MED ORDER — POLYETHYLENE GLYCOL 3350 17 G PO PACK: 17.0000 g | PACK | Freq: Every day | ORAL | Status: DC | PRN

## 2018-12-10 MED ORDER — K PHOS MONO-SOD PHOS DI & MONO 155-852-130 MG PO TABS
500.0000 mg | ORAL_TABLET | Freq: Two times a day (BID) | ORAL | Status: AC
  Administered 2018-12-10 (×2): 500 mg via ORAL
  Filled 2018-12-10 (×2): qty 2

## 2018-12-10 MED ORDER — ATORVASTATIN CALCIUM 10 MG PO TABS
10.0000 mg | ORAL_TABLET | Freq: Every day | ORAL | Status: DC
  Administered 2018-12-10 – 2018-12-21 (×12): 10 mg via ORAL
  Filled 2018-12-10 (×12): qty 1

## 2018-12-10 MED ORDER — SIMVASTATIN 20 MG PO TABS: 20.0000 mg | ORAL_TABLET | Freq: Every day | ORAL | Status: DC

## 2018-12-10 MED ORDER — SODIUM CHLORIDE 0.9 % IV SOLN
2.0000 g | INTRAVENOUS | Status: AC
  Administered 2018-12-10 – 2018-12-14 (×5): 2 g via INTRAVENOUS
  Filled 2018-12-10 (×5): qty 20

## 2018-12-10 NOTE — Progress Notes (Signed)
PROGRESS NOTE                                                                                                                                                                                                             Patient Demographics:    Derrick Bender, is a 73 y.o. male, DOB - 1946-02-10, AC:[id number]  Outpatient Primary MD for the patient is Glenda Chroman, MD   Admit date - 12/09/2018   LOS - 1  No chief complaint on file.      Brief Narrative: Patient is a 74 y.o. male with PMHx of-not on home O2, DM-2-(diet controlled at home), HTN, CVA, seizure disorder, prostate cancer s/p total prostatectomy-who was transferred from [facility] rocking him after he was found to have persistent fever, acute hypoxemic respiratory failure in the setting of COVID-19 pneumonia.  His daughter was recently tested positive for COVID-19.   Subjective:    Derrick Bender is better-continues to have coughing spells.   Assessment  & Plan :   Acute Hypoxic Resp Failure due to Covid 19 Viral pneumonia with concomitant bacterial pneumonia: Improved, oxygen titrated down to 2 L.  Continue Rocephin/Zithromax along with steroids and Remdesivir.  Since improving and concern for bacterial pneumonia-no indication for Actemra.  Fever: febrile  O2 requirements: On 2 l/m (was on 5 yesterday)  COVID-19 Labs: Recent Labs    12/10/18 0600  DDIMER 0.69*  FERRITIN 230  LDH 510*  CRP 14.2*    Lab Results  Component Value Date   SARSCOV2NAA NOT DETECTED 10/14/2018   [place] Not Detected 09/30/2018     COVID-19 Medications: Steroids: Solu-Medrol 8/25>> Remdesivir: 8/25>> Actemra: Not indicated Convalescent Plasma:N/A Research Studies:N/A  Other medications: Diuretics:Euvolemic-no need for lasix Antibiotics: Rocephin/Zithromax 8/25>>  Prone/Incentive Spirometry: encouraged patient to lie prone for 3-4 hours at a time for a total of 16  hours a day, and to encourage incentive spirometry use 3-4/hour.  DVT Prophylaxis  :  Lovenox   Transaminitis: Likely secondary to COVID-19-follow LFTs  AKI: Mild-likely hemodynamically mediated-follow.  COPD: No evidence of exacerbation-continue bronchodilators.  DM-2: Diet controlled at home-check A1c-monitor with SSI.  HTN: Controlled-continue lisinopril and diltiazem.  Seizure disorder: Resume Dilantin  History of CVA: No focal deficits-continue Plavix  History of 13 x 10 mm pituitary lesion: Follows with [facility]surgery at [facility]  History of prostate cancer: Stable for outpatient follow-up with his outpatient physicians  Debility/deconditioning: Secondary to acute illness-obtain PT/OT  Obesity: Estimated body mass index is 32.23 kg/m as calculated from the following:   Height as of this encounter: 5\' 9"  (1.753 m).   Weight as of this encounter: 99 kg.   ABG: No results found for: PHART, PCO2ART, PO2ART, HCO3, TCO2, ACIDBASEDEF, O2SAT  Vent Settings: N/A   Condition - Stable  Family Communication  :  Spouse over the phone  Code Status :  Full Code  Diet :  Diet Order            Diet heart healthy/carb modified Room service appropriate? Yes; Fluid consistency: Thin  Diet effective now               Disposition Plan  :  Remain hospitalized  Consults  :  None  Procedures  :  None  Antibiotics  :    Anti-infectives (From admission, onward)   Start     Dose/Rate Route Frequency Ordered Stop   12/11/18 1000  remdesivir 100 mg in sodium chloride 0.9 % 250 mL IVPB     100 mg 500 mL/hr over 30 Minutes Intravenous Every 24 hours 12/10/18 0810 12/15/18 0959   12/10/18 1000  cefTRIAXone (ROCEPHIN) 2 g in sodium chloride 0.9 % 100 mL IVPB    Note to Pharmacy: [facility] listed Keflex as an allergy, but it is not listed as significant in our system. The patient was on Levofloxacin and azithromycin at sending facility.   2 g 200 mL/hr over 30  Minutes Intravenous Every 24 hours 12/10/18 0548 12/15/18 0959   12/10/18 1000  azithromycin (ZITHROMAX) tablet 500 mg     500 mg Oral Daily 12/10/18 0548 12/15/18 0959   12/10/18 1000  remdesivir 200 mg in sodium chloride 0.9 % 250 mL IVPB     200 mg 500 mL/hr over 30 Minutes Intravenous Once 12/10/18 0810        Inpatient Medications  Scheduled Meds: . azithromycin  500 mg Oral Daily  . benzonatate  200 mg Oral TID  . clopidogrel  75 mg Oral Daily  . diltiazem  180 mg Oral Daily  . enoxaparin (LOVENOX) injection  40 mg Subcutaneous Q24H  . insulin aspart  0-20 Units Subcutaneous TID WC  . Ipratropium-Albuterol  1 puff Inhalation Q6H  . lisinopril  20 mg Oral Daily  . methylPREDNISolone (SOLU-MEDROL) injection  40 mg Intravenous Q12H  . phenytoin  100 mg Oral TID  . phosphorus  500 mg Oral BID  . simvastatin  20 mg Oral QHS  . topiramate  100 mg Oral BID   Continuous Infusions: . cefTRIAXone (ROCEPHIN)  IV 2 g (12/10/18 0915)  . magnesium sulfate bolus IVPB    . remdesivir 200 mg in NS 250 mL     Followed by  . [START ON 12/11/2018] remdesivir 100 mg in NS 250 mL     PRN Meds:.acetaminophen, albuterol, chlorpheniramine-HYDROcodone, diazepam, polyethylene glycol, prochlorperazine   Time Spent in minutes  25  See all Orders from today for further details   Oren Binet M.D on 12/10/2018 at 10:40 AM  To page go to [email or web address] - use universal password  [facility] -  Office  [phone]    Objective:   Vitals:   12/10/18 0300 12/10/18 0305 12/10/18 0500 12/10/18 0800  BP: (!) 149/80   134/75  Pulse: 87 75  84  Resp: (!) 35 (!) 34  (!)  24  Temp:  (!) 101.6 F (38.7 C)  99.1 F (37.3 C)  TempSrc:  Oral  Oral  SpO2: 99% 98%  99%  Weight:   99 kg   Height:   5\' 9"  (1.753 m)     Wt Readings from Last 3 Encounters:  12/10/18 99 kg  11/16/15 100.2 kg    No intake or output data in the 24 hours ending 12/10/18 1040   Physical Exam Gen Exam:Alert  awake-not in any distress HEENT:atraumatic, normocephalic Chest: B/L clear to auscultation anteriorly CVS:S1S2 regular Abdomen:soft non tender, non distended Extremities:no edema Neurology: Non focal Skin: no rash   Data Review:    CBC Recent Labs  Lab 12/10/18 0600  WBC 4.5  HGB 12.1*  HCT 38.3*  PLT 153  MCV 88.2  MCH 27.9  MCHC 31.6  RDW 14.8  LYMPHSABS 1.1  MONOABS 0.5  EOSABS 0.0  BASOSABS 0.0    Chemistries  Recent Labs  Lab 12/10/18 0600  NA 139  K 3.6  CL 108  CO2 22  GLUCOSE 111*  BUN 28*  CREATININE 1.26*  CALCIUM 8.0*  MG 1.9  AST 109*  ALT 57*  ALKPHOS 91  BILITOT 0.6   ------------------------------------------------------------------------------------------------------------------ Recent Labs    12/10/18 0600  TRIG 215*    Lab Results  Component Value Date   HGBA1C 6.5 (H) 12/10/2018   ------------------------------------------------------------------------------------------------------------------ No results for input(s): TSH, T4TOTAL, T3FREE, THYROIDAB in the last 72 hours.  Invalid input(s): FREET3 ------------------------------------------------------------------------------------------------------------------ Recent Labs    12/10/18 0600  FERRITIN 230    Coagulation profile No results for input(s): INR, PROTIME in the last 168 hours.  Recent Labs    12/10/18 0600  DDIMER 0.69*    Cardiac Enzymes No results for input(s): CKMB, TROPONINI, MYOGLOBIN in the last 168 hours.  Invalid input(s): CK ------------------------------------------------------------------------------------------------------------------    Component Value Date/Time   BNP 36.3 12/10/2018 0600    Micro Results No results found for this or any previous visit (from the past 240 hour(s)).  Radiology Reports Dg Chest Port 1 View  Result Date: 12/10/2018 CLINICAL DATA:  Pneumonia due to COVID-19 EXAM: PORTABLE CHEST 1 VIEW COMPARISON:  Portable  exam 0620 hours compared to 12/09/2018 FINDINGS: Enlargement of cardiac silhouette. Tortuous aorta. Pulmonary vascularity normal. BILATERAL pulmonary infiltrates again seen, little changed. No pleural effusion or pneumothorax. Bones unremarkable. IMPRESSION: Persistent pulmonary infiltrates bilaterally. Electronically Signed   By: Lavonia Dana M.D.   On: 12/10/2018 08:59

## 2018-12-10 NOTE — Progress Notes (Signed)
Remdesivir - Pharmacy Brief Note   O:  ALT: 57 CXR: +per MD notes. SpO2: 99-100% on 5L O2   A/P:  Remdesivir 200 mg once followed by 100 mg daily x 4 days.   Gretta Arab PharmD, BCPS Clinical pharmacist phone 7am- 5pm: [phone] 12/10/2018 7:08 AM

## 2018-12-10 NOTE — Evaluation (Signed)
Physical Therapy Evaluation Patient Details Name: Derrick Bender MRN: [id number] DOB: 26-Jun-1945 Today's Date: 12/10/2018   History of Present Illness  Patient is a 73 y.o. male with PMHx of-not on home O2, DM-2-(diet controlled at home), HTN, CVA, seizure disorder, prostate cancer s/p total prostatectomy-who was transferred from [facility] rocking him after he was found to have persistent fever, acute hypoxemic respiratory failure in the setting of COVID-19 pneumonia.  His daughter was recently tested positive for COVID-19.  Clinical Impression   The patient on 2 L [place] with SPO2 dropping to 86%. RN in room. Provided IS for patient to use and instructed. Per Rn, pt. Has a fever. Patient assisted to sitting on bed edge and back into bed. Pt admitted with above diagnosis. Patient hopefully will improve  To return to home. Patient very active, recently went and bought another tractor. Pt currently with functional limitations due to the deficits listed below (see PT Problem List). Pt will benefit from skilled PT to increase their independence and safety with mobility to allow discharge to the venue listed below.       Follow Up Recommendations Home health PT    Equipment Recommendations  (tba)    Recommendations for Other Services       Precautions / Restrictions Precautions Precautions: Fall Precaution Comments: watch sats      Mobility  Bed Mobility Overal bed mobility: Needs Assistance Bed Mobility: Rolling;Sidelying to Sit;Sit to Sidelying Rolling: Min assist Sidelying to sit: Min assist     Sit to sidelying: Min assist General bed mobility comments: able to reach for rail  Transfers                 General transfer comment: NT, has to have bladder scanned  Ambulation/Gait                Stairs            Wheelchair Mobility    Modified Rankin (Stroke Patients Only)       Balance Overall balance assessment: Needs assistance Sitting-balance support:  Bilateral upper extremity supported Sitting balance-Leahy Scale: Good                                       Pertinent Vitals/Pain      Home Living Family/patient expects to be discharged to:: Private residence Living Arrangements: Spouse/significant other Available Help at Discharge: Family Type of Home: House Home Access: Stairs to enter Entrance Stairs-Rails: Psychiatric nurse of Steps: 3  back, Home Layout: Two level;Able to live on main level with bedroom/bathroom Home Equipment: None      Prior Function Level of Independence: Independent         Comments: farms, raises cows, has [place], horses,     Hand Dominance        Extremity/Trunk Assessment   Upper Extremity Assessment Upper Extremity Assessment: Generalized weakness    Lower Extremity Assessment Lower Extremity Assessment: Generalized weakness    Cervical / Trunk Assessment Cervical / Trunk Assessment: Normal  Communication   Communication: No difficulties  Cognition Arousal/Alertness: Awake/alert Behavior During Therapy: WFL for tasks assessed/performed Overall Cognitive Status: Within Functional Limits for tasks assessed  General Comments      Exercises     Assessment/Plan    PT Assessment Patient needs continued PT services  PT Problem List Decreased strength;Decreased mobility;Decreased safety awareness;Decreased activity tolerance;Cardiopulmonary status limiting activity;Decreased knowledge of use of DME       PT Treatment Interventions DME instruction;Therapeutic activities;Gait training;Therapeutic exercise;Stair training;Patient/family education;Functional mobility training    PT Goals (Current goals can be found in the Care Plan section)  Acute Rehab PT Goals 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 PT Goal Formulation: With patient Time For Goal Achievement: 12/24/18 Potential to Achieve  Goals: Good    Frequency Min 3X/week   Barriers to discharge        Co-evaluation               AM-PAC PT "6 Clicks" Mobility  Outcome Measure Help needed turning from your back to your side while in a flat bed without using bedrails?: A Little Help needed moving from lying on your back to sitting on the side of a flat bed without using bedrails?: A Little Help needed moving to and from a bed to a chair (including a wheelchair)?: A Lot Help needed standing up from a chair using your arms (e.g., wheelchair or bedside chair)?: A Lot Help needed to walk in hospital room?: A Lot Help needed climbing 3-5 steps with a railing? : A Lot 6 Click Score: 14    End of Session Equipment Utilized During Treatment: Oxygen Activity Tolerance: Patient limited by fatigue Patient left: in bed;with call bell/phone within reach Nurse Communication: Mobility status PT Visit Diagnosis: Difficulty in walking, not elsewhere classified (R26.2);Unsteadiness on feet (R26.81)    Time: [id number] PT Time Calculation (min) (ACUTE ONLY): 54 min   Charges:     PT Treatments $Therapeutic Activity: 23-37 mins $Self Care/Home Management: [place]  Office [phone]   Claretha Cooper 12/10/2018, 5:19 PM

## 2018-12-10 NOTE — H&P (Addendum)
History and Physical    Derrick Bender AJO:[id number] DOB: June 03, 1945 DOA: 12/09/2018  PCP: Glenda Chroman, MD   Patient coming from: Home.  I have personally briefly reviewed patient's old medical records in [place]  Chief Complaint: Shortness of breath.  HPI: Derrick Bender is a 73 y.o. male with medical history significant of asthma/COPD, history of CVA, history of TIA, depression, type 2 diabetes, hyperlipidemia, hypertension OSA not on CPAP, prostate cancer, seizure disorder who is coming from [facility] after being admitted there on 12/06/2018 with COPD exacerbation after presenting there due to progressively worse dyspnea, fever, chills, dry cough, sore throat, fatigue, malaise, decreased appetite and diarrhea since 2 days prior.  The patient may have been exposed to a relative that was positive for COVID 19 and tested positive while at the facility.  Arrangements were subsequently made for him to be transferred here for COVID-19 pneumonia treatment.  He denies dizziness, palpitations, PND, orthopnea or pitting edema of the lower extremities.  No nausea or vomiting.  No constipation, melena or hematochezia.  Denies dysuria, frequency or hematuria.  No polyphagia, polydipsia, polyuria or blurred vision.  Review of Systems: As per HPI otherwise 10 point review of systems negative.   Past Medical History:  Diagnosis Date   Asthma    COPD (chronic obstructive pulmonary disease) ([place])    CVA (cerebral vascular accident) ([place]) 01/2011   [place]   Depression    Diabetes ([place])    Dysarthria    Hyperlipidemia    Hypertension    OSA (obstructive sleep apnea)    Prostate cancer ([place])    Dr. Exie Parody   Seizure [facility])    TIA (transient ischemic attack)      reports that he quit smoking about 23 years ago. His smoking use included cigarettes. He started smoking about 58 years ago. He has a 18.50 pack-year smoking history. He has never used smokeless tobacco. No history  on file for alcohol and drug.  Allergies  Allergen Reactions   Sulfamethoxazole Dermatitis    Family History  Problem Relation Age of Onset   Bone cancer Mother    Pancreatic cancer Father    Prior to Admission medications   Medication Sig Start Date End Date Taking? Authorizing Provider  albuterol (PROAIR HFA) 108 (90 Base) MCG/ACT inhaler Inhale 2 puffs into the lungs every 6 (six) hours as needed.    [provider]  clopidogrel (PLAVIX) 75 MG tablet Take 75 mg by mouth daily.    [provider]  diazepam (VALIUM) 5 MG tablet Take 5 mg by mouth 2 (two) times daily as needed.    [provider]  diltiazem (CARDIZEM CD) 180 MG 24 hr capsule Take 180 mg by mouth daily.    [provider]  folic acid (FOLVITE) 1 MG tablet Take 1 tablet by mouth daily. 08/27/15   [provider]  lisinopril (PRINIVIL,ZESTRIL) 20 MG tablet Take 20 mg by mouth daily.    [provider]  omeprazole (PRILOSEC) 40 MG capsule Take 1 capsule by mouth daily. 10/21/15   [provider]  oxybutynin (DITROPAN) 5 MG tablet Take 1 tablet by mouth 3 (three) times daily. 01/28/15   [provider]  PARoxetine (PAXIL) 40 MG tablet Take 20 mg by mouth daily.    [provider]  phenytoin (DILANTIN) 100 MG ER capsule Take 100 mg by mouth 3 (three) times daily.    [provider]  predniSONE (DELTASONE) 10 MG tablet  Take 10 mg by mouth daily.    [provider]  simvastatin (ZOCOR) 20 MG tablet Take 20 mg by mouth at bedtime.    [provider]  Testosterone 12.5 MG/ACT (1%) GEL Apply 1.25 g topically daily. 09/02/15   [provider]  topiramate (TOPAMAX) 100 MG tablet Take 100 mg by mouth 2 (two) times daily.    [provider]    Physical Exam: Vital signs on monitor while being examined were pulse 86, O2 sat 99% on 4 LPM via nasal cannula, RR was 23 breaths/min and blood pressure 134/83  mmHg. Constitutional: NAD, calm, comfortable Eyes: PERRL, lids and conjunctivae mildly injected ENMT: Mucous membranes are mildly dry. Posterior pharynx clear of any exudate or lesions Neck: normal, supple, no masses, no thyromegaly Respiratory: Normal respiratory effort. No accessory muscle use. Decreased breath sounds in bases with bibasilar crackles, no wheezing or rhonchi.  Cardiovascular: Regular rate and rhythm, no murmurs / rubs / gallops. No extremity edema. 2+ pedal pulses. No carotid bruits.  Abdomen: Soft, no tenderness, no masses palpated. No hepatosplenomegaly. Bowel sounds positive.  Musculoskeletal: no clubbing / cyanosis.Good ROM, no contractures. Normal muscle tone.  Skin: Hyperpigmented macules on lower extremities on limited dermatological examination Neurologic: CN 2-12 grossly intact. Sensation intact, DTR normal. Strength 5/5 in all 4.  Psychiatric: Normal judgment and insight. Alert and oriented x 3. Normal mood.   Labs on Admission: I have personally reviewed following labs and imaging studies  CBC: No results for input(s): WBC, NEUTROABS, HGB, HCT, MCV, PLT in the last 168 hours. Basic Metabolic Panel: No results for input(s): NA, K, CL, CO2, GLUCOSE, BUN, CREATININE, CALCIUM, MG, PHOS in the last 168 hours. GFR: CrCl cannot be calculated (No successful lab value found.). Liver Function Tests: No results for input(s): AST, ALT, ALKPHOS, BILITOT, PROT, ALBUMIN in the last 168 hours. No results for input(s): LIPASE, AMYLASE in the last 168 hours. No results for input(s): AMMONIA in the last 168 hours. Coagulation Profile: No results for input(s): INR, PROTIME in the last 168 hours. Cardiac Enzymes: No results for input(s): CKTOTAL, CKMB, CKMBINDEX, TROPONINI in the last 168 hours. BNP (last 3 results) No results for input(s): PROBNP in the last 8760 hours. HbA1C: No results for input(s): HGBA1C in the last 72 hours. CBG: No results for input(s): GLUCAP in the  last 168 hours. Lipid Profile: No results for input(s): CHOL, HDL, LDLCALC, TRIG, CHOLHDL, LDLDIRECT in the last 72 hours. Thyroid Function Tests: No results for input(s): TSH, T4TOTAL, FREET4, T3FREE, THYROIDAB in the last 72 hours. Anemia Panel: No results for input(s): VITAMINB12, FOLATE, FERRITIN, TIBC, IRON, RETICCTPCT in the last 72 hours. Urine analysis: No results found for: COLORURINE, APPEARANCEUR, LABSPEC, PHURINE, GLUCOSEU, HGBUR, BILIRUBINUR, KETONESUR, PROTEINUR, UROBILINOGEN, NITRITE, LEUKOCYTESUR  Radiological Exams on Admission: No results found.  EKG: Independently reviewed.   Assessment/Plan Principal Problem:   Pneumonia due to COVID-19 virus Admit to telemetry/inpatient. Continue supplemental oxygen. Combivent every 6 hours. Dexamethasone 6 mg IVP every 24 hours. Azithromycin plus ceftriaxone daily. Follow CBC, fibrinogen, d-dimer and ESR. Follow CMP, CRP, ferritin, CK, LDH, triglycerides.  Active Problems:   COPD (chronic obstructive pulmonary disease) ([place]) Continue treatment as above. Bronchodilators as needed.    Hyperlipidemia Resume simvastatin 1 dose confirmed.    Seizure ([place]) Resume phenytoin and Topamax once dose confirmed.    Depression Will need paroxetine dosage reconciliation.     Hypertension Hold lisinopril for now. Monitor blood pressure.    Type 2 diabetes mellitus (  HCC) Carbohydrate modified diet. CBG monitoring with RI SS while on glucocorticoids.    DVT prophylaxis: Lovenox SQ. Code Status: Full code. Family Communication: Disposition Plan: Admit for COVID-19 pneumonia treatment. Consults called: Admission status: Inpatient/telemetry.   Reubin Milan MD [facility]  If 7PM-7AM, please contact night-coverage [email or web address]  12/10/2018, 4:01 AM   This document was prepared using Dragon voice recognition software and may contain some unintended transcription errors.

## 2018-12-10 NOTE — Progress Notes (Signed)
   Vital Signs MEWS/VS Documentation      12/10/2018 2030 12/10/2018 2040 12/10/2018 2050 12/10/2018 2100   MEWS Score:  4  4  4  5    MEWS Score Color:  Red  Red  Red  Red   Resp:  (!) 49  (!) 52  (!) 31  (!) 45   Pulse:  93  92  90  89   Temp:  -  -  (!) 101.8 F (38.8 C)  -   O2 Device:  Non-rebreather Mask  Non-rebreather Mask  Non-rebreather Mask  -   O2 Flow Rate (L/min):  4 L/min  4 L/min  4 L/min  -      1931 - Pt temp 101.3 axillary.  Tylenol not due until 2230.  Ice packs placed on patient.    Pt reports difficulty breathing.  O2 sats fine.  Labored breathing, accessory muscle use, retractions, elevated RR.  Placed on non-rebreather.    No improvement in respiratory effort.  O2 increased to 4L.    Temp at 2050 101.8 oral.  Respiratory rate remains high - upper 30s to low 50s.  MD paged.  Orders received for 1 time dose of tylenol and 500 mL bolus NS.  Both administered.      Mateo Flow, Amberrose Friebel K 12/10/2018,9:38 PM

## 2018-12-11 DIAGNOSIS — I10 Essential (primary) hypertension: Secondary | ICD-10-CM

## 2018-12-11 DIAGNOSIS — J449 Chronic obstructive pulmonary disease, unspecified: Secondary | ICD-10-CM

## 2018-12-11 DIAGNOSIS — R569 Unspecified convulsions: Secondary | ICD-10-CM

## 2018-12-11 LAB — CBC WITH DIFFERENTIAL/PLATELET
Abs Immature Granulocytes: 0.02 K/uL (ref 0.00–0.07)
Basophils Absolute: 0 K/uL (ref 0.0–0.1)
Basophils Relative: 0 %
Eosinophils Absolute: 0 K/uL (ref 0.0–0.5)
Eosinophils Relative: 0 %
HCT: 35 % — ABNORMAL LOW (ref 39.0–52.0)
Hemoglobin: 11.2 g/dL — ABNORMAL LOW (ref 13.0–17.0)
Immature Granulocytes: 1 %
Lymphocytes Relative: 11 %
Lymphs Abs: 0.5 K/uL — ABNORMAL LOW (ref 0.7–4.0)
MCH: 28.3 pg (ref 26.0–34.0)
MCHC: 32 g/dL (ref 30.0–36.0)
MCV: 88.4 fL (ref 80.0–100.0)
Monocytes Absolute: 0.7 K/uL (ref 0.1–1.0)
Monocytes Relative: 15 %
Neutro Abs: 3.1 K/uL (ref 1.7–7.7)
Neutrophils Relative %: 73 %
Platelets: 147 K/uL — ABNORMAL LOW (ref 150–400)
RBC: 3.96 MIL/uL — ABNORMAL LOW (ref 4.22–5.81)
RDW: 15 % (ref 11.5–15.5)
WBC: 4.3 K/uL (ref 4.0–10.5)
nRBC: 0 % (ref 0.0–0.2)

## 2018-12-11 LAB — COMPREHENSIVE METABOLIC PANEL WITH GFR
ALT: 47 U/L — ABNORMAL HIGH (ref 0–44)
AST: 72 U/L — ABNORMAL HIGH (ref 15–41)
Albumin: 2.6 g/dL — ABNORMAL LOW (ref 3.5–5.0)
Alkaline Phosphatase: 78 U/L (ref 38–126)
Anion gap: 8 (ref 5–15)
BUN: 25 mg/dL — ABNORMAL HIGH (ref 8–23)
CO2: 22 mmol/L (ref 22–32)
Calcium: 7.7 mg/dL — ABNORMAL LOW (ref 8.9–10.3)
Chloride: 110 mmol/L (ref 98–111)
Creatinine, Ser: 1.2 mg/dL (ref 0.61–1.24)
GFR calc Af Amer: >60 mL/min
GFR calc non Af Amer: 60 mL/min — ABNORMAL LOW
Glucose, Bld: 197 mg/dL — ABNORMAL HIGH (ref 70–99)
Potassium: 3.5 mmol/L (ref 3.5–5.1)
Sodium: 140 mmol/L (ref 135–145)
Total Bilirubin: 0.6 mg/dL (ref 0.3–1.2)
Total Protein: 6.1 g/dL — ABNORMAL LOW (ref 6.5–8.1)

## 2018-12-11 LAB — GLUCOSE, CAPILLARY
Glucose-Capillary: 165 mg/dL — ABNORMAL HIGH (ref 70–99)
Glucose-Capillary: 167 mg/dL — ABNORMAL HIGH (ref 70–99)
Glucose-Capillary: 192 mg/dL — ABNORMAL HIGH (ref 70–99)

## 2018-12-11 LAB — C-REACTIVE PROTEIN: CRP: 15.4 mg/dL — ABNORMAL HIGH (ref <1.0)

## 2018-12-11 LAB — D-DIMER, QUANTITATIVE: D-Dimer, Quant: 0.75 ug{FEU}/mL — ABNORMAL HIGH (ref 0.00–0.50)

## 2018-12-11 LAB — PHOSPHORUS: Phosphorus: 3.7 mg/dL (ref 2.5–4.6)

## 2018-12-11 LAB — PROCALCITONIN: Procalcitonin: 2.71 ng/mL

## 2018-12-11 LAB — MAGNESIUM: Magnesium: 2.3 mg/dL (ref 1.7–2.4)

## 2018-12-11 LAB — FERRITIN: Ferritin: 268 ng/mL (ref 24–336)

## 2018-12-11 MED ORDER — LOPERAMIDE HCL 2 MG PO CAPS
2.0000 mg | ORAL_CAPSULE | Freq: Once | ORAL | Status: AC
  Administered 2018-12-11: 2 mg via ORAL
  Filled 2018-12-11: qty 1

## 2018-12-11 NOTE — Evaluation (Signed)
Occupational Therapy Evaluation Patient Details Name: Derrick Bender MRN: [id number] DOB: 1945/06/06 Today's Date: 12/11/2018    History of Present Illness Patient is a 73 y.o. male with PMHx including, DM-2, HTN, CVA, seizure disorder, prostate cancer s/p total prostatectomy. Presented to ED with persistent fever, acute hypoxemic respiratory failure (not typically on home O2) in the setting of COVID-19 pneumonia.   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 Initially on 1.5 L [place] with SpO2 dropping to 74(finger probe) during BSC transfer at min A +2 for line management and safety, required increased to 4 L for above 85%. Max A for rear peri care in standing, set up for grooming tasks in sitting, mod A for LB ADL at this time. Switched to ear probe  w/ 94% on 4 L during short in room mobility. After sitting in recliner, returned to 1.5 L [place] /monitor on ear, SPO2 94%. OT will continue to follow acutely and Pt will require skilled OT at the [facility] level to maximize safety and independence in ADL and functional transfers. Next session plan on bringing theraband to establish HEP and EC handout.     Follow Up Recommendations  Home health OT;Supervision/Assistance - 24 hour(initially)    Equipment Recommendations  3 in 1 bedside commode    Recommendations for Other Services PT consult     Precautions / Restrictions Precautions Precautions: Fall Precaution Comments: watch sats Restrictions Weight Bearing Restrictions: No      Mobility Bed Mobility Overal bed mobility: Needs Assistance Bed Mobility: Supine to Sit Rolling: Min assist   Supine to sit: Min assist;HOB elevated     General bed mobility comments: extra time, use of bed rail  Transfers Overall transfer level: Needs assistance Equipment used: Rolling walker (2 wheeled) Transfers: Sit to/from [facility] Sit to Stand: Min assist;+2  safety/equipment Stand pivot transfers: Min assist;+2 safety/equipment       General transfer comment: steady assist to rise from bed, cues to reach back to sit down to BSC/recliner. (safe hand placement with RW)    Balance Overall balance assessment: Needs assistance Sitting-balance support: Bilateral upper extremity supported Sitting balance-Leahy Scale: Good     Standing balance support: Bilateral upper extremity supported;During functional activity Standing balance-Leahy Scale: Fair Standing balance comment: slight posterior leaning initially                           ADL either performed or assessed with clinical judgement   ADL Overall ADL's : Needs assistance/impaired Eating/Feeding: Independent   Grooming: Set up;Wash/dry hands;Wash/dry face;Sitting   Upper Body Bathing: Minimal assistance;Sitting   Lower Body Bathing: Moderate assistance;Sitting/lateral leans   Upper Body Dressing : Min guard;Sitting   Lower Body Dressing: Moderate assistance;Sit to/from stand   Toilet Transfer: Minimal assistance;+2 for physical assistance;+2 for safety/equipment;RW;Ambulation;BSC Toilet Transfer Details (indicate cue type and reason): short ambulation around end of bed Toileting- Clothing Manipulation and Hygiene: Maximal assistance Toileting - Clothing Manipulation Details (indicate cue type and reason): Pt able to stand with RW, then OT performed rear peri care     Functional mobility during ADLs: Minimal assistance;+2 for physical assistance;+2 for safety/equipment;Rolling walker;Cueing for safety(vc for safe hand placement) General ADL Comments: decreased activity tolerance     Vision Baseline Vision/History: Wears glasses Wears Glasses: Reading only Patient Visual Report: No change from baseline       Perception  Praxis      Pertinent Vitals/Pain Pain Assessment: No/denies pain     Hand Dominance     Extremity/Trunk Assessment Upper Extremity  Assessment Upper Extremity Assessment: Generalized weakness   Lower Extremity Assessment Lower Extremity Assessment: Generalized weakness   Cervical / Trunk Assessment Cervical / Trunk Assessment: Normal   Communication Communication Communication: No difficulties   Cognition Arousal/Alertness: Awake/alert Behavior During Therapy: WFL for tasks assessed/performed;Flat affect Overall Cognitive Status: Within Functional Limits for tasks assessed                                     General Comments       Exercises     Shoulder Instructions      Home Living Family/patient expects to be discharged to:: Private residence Living Arrangements: Spouse/significant other Available Help at Discharge: Family Type of Home: House Home Access: Stairs to enter [employer] of Steps: 3  back, Entrance Stairs-Rails: Right;Left Home Layout: Two level;Able to live on main level with bedroom/bathroom Alternate Level Stairs-Number of Steps: flight   Bathroom Shower/Tub: Teacher, early years/pre: Standard     Home Equipment: None          Prior Functioning/Environment Level of Independence: Independent        Comments: farms, raises cows, has [place], horses,        OT Problem List: Decreased strength;Decreased activity tolerance;Impaired balance (sitting and/or standing);Decreased safety awareness;Decreased knowledge of use of DME or AE;Decreased knowledge of precautions;Cardiopulmonary status limiting activity      OT Treatment/Interventions: Self-care/ADL training;Therapeutic exercise;Energy conservation;DME and/or AE instruction;Therapeutic activities;Patient/family education;Balance training    OT Goals(Current goals can be found in the care plan section) Acute Rehab OT Goals 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 OT Goal Formulation: With patient Time For Goal Achievement: 12/25/18 Potential to Achieve Goals: Good ADL Goals Pt Will  Perform Grooming: with supervision;standing Pt Will Perform Upper Body Dressing: with modified independence;sitting Pt Will Perform Lower Body Dressing: with supervision;sit to/from stand Pt Will Transfer to Toilet: with modified independence;ambulating Pt Will Perform Toileting - Clothing Manipulation and hygiene: with modified independence;sit to/from stand Pt/caregiver will Perform Home Exercise Program: Both right and left upper extremity;With theraband;Independently;With written HEP provided Additional ADL Goal #1: Pt will verbalized 3 energy conservation strategies with ADL at independent level  OT Frequency: Min 3X/week   Barriers to D/C:            Co-evaluation PT/OT/SLP Co-Evaluation/Treatment: Yes Reason for Co-Treatment: For patient/therapist safety PT goals addressed during session: Mobility/safety with mobility;Proper use of DME OT goals addressed during session: ADL's and self-care;Proper use of Adaptive equipment and DME      AM-PAC OT "6 Clicks" Daily Activity     Outcome Measure Help from another person eating meals?: None Help from another person taking care of personal grooming?: A Little Help from another person toileting, which includes using toliet, bedpan, or urinal?: A Lot Help from another person bathing (including washing, rinsing, drying)?: A Lot Help from another person to put on and taking off regular upper body clothing?: A Little Help from another person to put on and taking off regular lower body clothing?: A Lot 6 Click Score: 16   End of Session Equipment Utilized During Treatment: Gait belt;Rolling walker;Oxygen(1.5-4L) Nurse Communication: Mobility status;Precautions  Activity Tolerance: Patient tolerated treatment well Patient left: in chair;with call bell/phone within reach  OT Visit  Diagnosis: Unsteadiness on feet (R26.81);Other abnormalities of gait and mobility (R26.89);Muscle weakness (generalized) (M62.81)                Time:  [id number] OT Time Calculation (min): 36 min Charges:  OT General Charges $OT Visit: 1 Visit OT Evaluation $OT Eval Moderate Complexity: [place] OTR/L Acute Rehabilitation Services Pager: [phone] Office: [place] 12/11/2018, 2:26 PM

## 2018-12-11 NOTE — Progress Notes (Signed)
PROGRESS NOTE                                                                                                                                                                                                             Patient Demographics:    Derrick Bender, is a 73 y.o. male, DOB - 09/05/1945, AC:[id number]  Outpatient Primary MD for the patient is Glenda Chroman, MD   Admit date - 12/09/2018   LOS - 2  No chief complaint on file.      Brief Narrative: Patient is a 73 y.o. male with PMHx of-not on home O2, DM-2-(diet controlled at home), HTN, CVA, seizure disorder, prostate cancer s/p total prostatectomy, OSA on CPAP-who was transferred from [facility] after he was found to have persistent fever, acute hypoxemic respiratory failure in the setting of COVID-19 pneumonia.  His daughter was recently tested positive for COVID-19.   Subjective:    Derrick Bender thinks he feels better this morning-he apparently had a bout of bronchospasm and shortness of breath last night.   Assessment  & Plan :   Acute Hypoxic Resp Failure due to Covid 19 Viral pneumonia with concomitant bacterial pneumonia: Improved-he was titrated down to 1 L of oxygen this morning.  Continue steroids/Remdesivir/empiric Rocephin and Zithromax.   Fever: febrile  O2 requirements: On 1 l/m (was on 2 yesterday)  COVID-19 Labs: Recent Labs    12/10/18 0600 12/11/18 0350  DDIMER 0.69* 0.75*  FERRITIN 230 268  LDH 510*  --   CRP 14.2* 15.4*    Lab Results  Component Value Date   SARSCOV2NAA NOT DETECTED 10/14/2018   [place] Not Detected 09/30/2018     COVID-19 Medications: Steroids: Solu-Medrol 8/25>> Remdesivir: 8/25>> Actemra: Not indicated Convalescent Plasma:N/A Research Studies:N/A  Other medications: Diuretics:Euvolemic-no need for lasix Antibiotics: Rocephin/Zithromax 8/25>>  Prone/Incentive Spirometry: encouraged patient to  lie prone for 3-4 hours at a time for a total of 16 hours a day, and to encourage incentive spirometry use 3-4/hour.  DVT Prophylaxis  :  Lovenox   Transaminitis: Likely secondary to COVID-19-downtrending-follow LFTs periodically  AKI: Mild-likely hemodynamically mediated-improved-follow.  COPD: No evidence of exacerbation-continue bronchodilators.  DM-2 (A1c 6.5): Diet controlled at home-CBGs stable with SSI  HTN: Controlled-continue lisinopril and Cardizem.   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  OSA: Claims uses CPAP at night-currently due to our facility's policy of no CPAP-we will be on home oxygen.    Obesity: Estimated body mass index is 32.23 kg/m as calculated from the following:   Height as of this encounter: 5\' 9"  (1.753 m).   Weight as of this encounter: 99 kg.   ABG: No results found for: PHART, PCO2ART, PO2ART, HCO3, TCO2, ACIDBASEDEF, O2SAT  Vent Settings: N/A   Condition - Stable  Family Communication  :  Spouse over the phone on 8/26  Code Status :  Full Code  Diet :  Diet Order            Diet heart healthy/carb modified Room service appropriate? Yes; Fluid consistency: Thin  Diet effective now               Disposition Plan  :  Remain hospitalized-home later this week if clinical improvement continues  Consults  :  None  Procedures  :  None  Antibiotics  :    Anti-infectives (From admission, onward)   Start     Dose/Rate Route Frequency Ordered Stop   12/11/18 1000  remdesivir 100 mg in sodium chloride 0.9 % 250 mL IVPB     100 mg 500 mL/hr over 30 Minutes Intravenous Every 24 hours  12/10/18 0810 12/15/18 0959   12/10/18 1000  cefTRIAXone (ROCEPHIN) 2 g in sodium chloride 0.9 % 100 mL IVPB    Note to Pharmacy: [facility] listed Keflex as an allergy, but it is not listed as significant in our system. The patient was on Levofloxacin and azithromycin at sending facility.   2 g 200 mL/hr over 30 Minutes Intravenous Every 24 hours 12/10/18 0548 12/15/18 0959   12/10/18 1000  azithromycin (ZITHROMAX) tablet 500 mg     500 mg Oral Daily 12/10/18 0548 12/15/18 0959   12/10/18 1000  remdesivir 200 mg in sodium chloride 0.9 % 250 mL IVPB     200 mg 500 mL/hr over 30 Minutes Intravenous Once 12/10/18 0810 12/10/18 1223      Inpatient Medications  Scheduled Meds: . atorvastatin  10 mg Oral QHS  . azithromycin  500 mg Oral Daily  . benzonatate  200 mg Oral TID  . clopidogrel  75 mg Oral Daily  . diltiazem  180 mg Oral Daily  . enoxaparin (LOVENOX) injection  40 mg Subcutaneous Q24H  . escitalopram  20 mg Oral QHS  . insulin aspart  0-9 Units Subcutaneous TID WC  . Ipratropium-Albuterol  1 puff Inhalation Q6H  . lisinopril  20 mg Oral Daily  . methylPREDNISolone (SOLU-MEDROL) injection  40 mg Intravenous Q12H  . mometasone-formoterol  2 puff Inhalation BID  . oxybutynin  5 mg Oral TID  . pantoprazole  40 mg Oral Daily  . phenytoin  100 mg Oral Q8H  . topiramate  100 mg Oral BID   Continuous Infusions: . cefTRIAXone (ROCEPHIN)  IV 2 g (12/11/18 0903)  . remdesivir 100 mg in NS 250 mL 100 mg (12/11/18 0957)   PRN Meds:.acetaminophen, albuterol, chlorpheniramine-HYDROcodone, diazepam, polyethylene glycol, prochlorperazine   Time Spent in minutes  25  See all Orders from today for further details   Oren Binet M.D on 12/11/2018 at 1:25 PM  To page  go to [email or web address] - use universal password  [facility] -  Office  [phone]    Objective:   Vitals:   12/11/18 0825 12/11/18 0851 12/11/18 1000 12/11/18 1152  BP: (!) 141/79 (!) 145/88  (!) 146/73   Pulse: 65     Resp: (!) 30     Temp: 98.1 F (36.7 C)  98.3 F (36.8 C) 98.8 F (37.1 C)  TempSrc: Oral  Oral Oral  SpO2: 97%     Weight:      Height:        Wt Readings from Last 3 Encounters:  12/10/18 99 kg  11/16/15 100.2 kg     Intake/Output Summary (Last 24 hours) at 12/11/2018 1325 Last data filed at 12/11/2018 1105 Gross per 24 hour  Intake 1186.98 ml  Output 170 ml  Net 1016.98 ml     Physical Exam Gen Exam:Alert awake-not in any distress HEENT:atraumatic, normocephalic Chest: B/L clear to auscultation anteriorly CVS:S1S2 regular Abdomen:soft non tender, non distended Extremities:no edema Neurology: Non focal Skin: no rash   Data Review:    CBC Recent Labs  Lab 12/10/18 0600 12/11/18 0350  WBC 4.5 4.3  HGB 12.1* 11.2*  HCT 38.3* 35.0*  PLT 153 147*  MCV 88.2 88.4  MCH 27.9 28.3  MCHC 31.6 32.0  RDW 14.8 15.0  LYMPHSABS 1.1 0.5*  MONOABS 0.5 0.7  EOSABS 0.0 0.0  BASOSABS 0.0 0.0    Chemistries  Recent Labs  Lab 12/10/18 0600 12/11/18 0350  NA 139 140  K 3.6 3.5  CL 108 110  CO2 22 22  GLUCOSE 111* 197*  BUN 28* 25*  CREATININE 1.26* 1.20  CALCIUM 8.0* 7.7*  MG 1.9 2.3  AST 109* 72*  ALT 57* 47*  ALKPHOS 91 78  BILITOT 0.6 0.6   ------------------------------------------------------------------------------------------------------------------ Recent Labs    12/10/18 0600  TRIG 215*    Lab Results  Component Value Date   HGBA1C 6.5 (H) 12/10/2018   ------------------------------------------------------------------------------------------------------------------ No results for input(s): TSH, T4TOTAL, T3FREE, THYROIDAB in the last 72 hours.  Invalid input(s): FREET3 ------------------------------------------------------------------------------------------------------------------ Recent Labs    12/10/18 0600 12/11/18 0350  FERRITIN 230 268    Coagulation profile No results for input(s): INR, PROTIME in the last 168  hours.  Recent Labs    12/10/18 0600 12/11/18 0350  DDIMER 0.69* 0.75*    Cardiac Enzymes No results for input(s): CKMB, TROPONINI, MYOGLOBIN in the last 168 hours.  Invalid input(s): CK ------------------------------------------------------------------------------------------------------------------    Component Value Date/Time   BNP 36.3 12/10/2018 0600    Micro Results No results found for this or any previous visit (from the past 240 hour(s)).  Radiology Reports Dg Chest Port 1 View  Result Date: 12/10/2018 CLINICAL DATA:  Pneumonia due to COVID-19 EXAM: PORTABLE CHEST 1 VIEW COMPARISON:  Portable exam 0620 hours compared to 12/09/2018 FINDINGS: Enlargement of cardiac silhouette. Tortuous aorta. Pulmonary vascularity normal. BILATERAL pulmonary infiltrates again seen, little changed. No pleural effusion or pneumothorax. Bones unremarkable. IMPRESSION: Persistent pulmonary infiltrates bilaterally. Electronically Signed   By: Lavonia Dana M.D.   On: 12/10/2018 08:59

## 2018-12-11 NOTE — Plan of Care (Signed)
Spoke with wife to inform of patient current status and POC.  Explained Remdesivir dosage and plan for earliest discharge to be 8/29 d/t final dose being given.

## 2018-12-11 NOTE — Progress Notes (Signed)
Physical Therapy Treatment Patient Details Name: Derrick Bender MRN: [id number] DOB: Sep 01, 1945 Today's Date: 12/11/2018    History of Present Illness Patient is a 73 y.o. male with PMHx including, DM-2, HTN, CVA, seizure disorder, prostate cancer s/p total prostatectomy. Presented to ED with persistent fever, acute hypoxemic respiratory failure (not typically on home O2) in the setting of COVID-19 pneumonia.    PT Comments    The patient ambulated x 25' with RW . Initially on 1.5 L [place] with spo2 dropping to 74(finger probe) , increased to 4 L. Switched to ear probe  w/ 94% on 4 L . Replaced to 1.5 L [place] /monitor on ear, SPO2 94%. Continue progresing mobility.  Follow Up Recommendations  Home health PT     Equipment Recommendations  (tba)    Recommendations for Other Services       Precautions / Restrictions Precautions Precautions: Fall Precaution Comments: watch sats Restrictions Weight Bearing Restrictions: No    Mobility  Bed Mobility Overal bed mobility: Needs Assistance Bed Mobility: Supine to Sit Rolling: Min assist         General bed mobility comments: extra time, use of bed rail  Transfers Overall transfer level: Needs assistance Equipment used: Rolling walker (2 wheeled) Transfers: Sit to/from [facility] Sit to Stand: Min assist;+2 safety/equipment Stand pivot transfers: Min assist;+2 safety/equipment       General transfer comment: steady assist to rise from bed, cuea to reach back to sit down to BSC/recliner.  Ambulation/Gait Ambulation/Gait assistance: Min assist;+2 safety/equipment Gait Distance (Feet): 25 Feet Assistive device: Rolling walker (2 wheeled) Gait Pattern/deviations: Step-to pattern;Step-through pattern Gait velocity: decr   General Gait Details: pt picking up Rw, cues  to roll it,   Stairs             Wheelchair Mobility    Modified Rankin (Stroke Patients Only)       Balance   Sitting-balance  support: Bilateral upper extremity supported Sitting balance-Leahy Scale: Good     Standing balance support: Bilateral upper extremity supported;During functional activity Standing balance-Leahy Scale: Fair Standing balance comment: slight posterior leaning initially                            Cognition Arousal/Alertness: Awake/alert Behavior During Therapy: WFL for tasks assessed/performed Overall Cognitive Status: Within Functional Limits for tasks assessed                                        Exercises      General Comments        Pertinent Vitals/Pain Pain Assessment: No/denies pain    Home Living                      Prior Function            PT Goals (current goals can now be found in the care plan section) Progress towards PT goals: Progressing toward goals    Frequency    Min 3X/week      PT Plan Current plan remains appropriate    Co-evaluation PT/OT/SLP Co-Evaluation/Treatment: Yes Reason for Co-Treatment: For patient/therapist safety PT goals addressed during session: Mobility/safety with mobility OT goals addressed during session: ADL's and self-care      AM-PAC PT "6 Clicks" Mobility   Outcome Measure  Help needed turning from your back  to your side while in a flat bed without using bedrails?: A Little Help needed moving from lying on your back to sitting on the side of a flat bed without using bedrails?: A Little Help needed moving to and from a bed to a chair (including a wheelchair)?: A Lot Help needed standing up from a chair using your arms (e.g., wheelchair or bedside chair)?: A Lot Help needed to walk in hospital room?: A Lot Help needed climbing 3-5 steps with a railing? : A Lot 6 Click Score: 14    End of Session Equipment Utilized During Treatment: Gait belt;Oxygen Activity Tolerance: Patient tolerated treatment well Patient left: in chair;with call bell/phone within reach;with chair alarm  set Nurse Communication: Mobility status PT Visit Diagnosis: Difficulty in walking, not elsewhere classified (R26.2);Unsteadiness on feet (R26.81)     Time: [id number] PT Time Calculation (min) (ACUTE ONLY): 36 min  Charges:  $Gait Training: 8-22 mins                     [place]  Office [phone]    Claretha Cooper 12/11/2018, 1:42 PM

## 2018-12-12 LAB — GLUCOSE, CAPILLARY
Glucose-Capillary: 282 mg/dL — ABNORMAL HIGH (ref 70–99)
Glucose-Capillary: 186 mg/dL — ABNORMAL HIGH (ref 70–99)
Glucose-Capillary: 305 mg/dL — ABNORMAL HIGH (ref 70–99)
Glucose-Capillary: 263 mg/dL — ABNORMAL HIGH (ref 70–99)
Glucose-Capillary: 215 mg/dL — ABNORMAL HIGH (ref 70–99)

## 2018-12-12 LAB — COMPREHENSIVE METABOLIC PANEL
ALT: 44 U/L (ref 0–44)
AST: 61 U/L — ABNORMAL HIGH (ref 15–41)
Albumin: 2.3 g/dL — ABNORMAL LOW (ref 3.5–5.0)
Alkaline Phosphatase: 75 U/L (ref 38–126)
Anion gap: 12 (ref 5–15)
BUN: 26 mg/dL — ABNORMAL HIGH (ref 8–23)
CO2: 20 mmol/L — ABNORMAL LOW (ref 22–32)
Calcium: 8 mg/dL — ABNORMAL LOW (ref 8.9–10.3)
Chloride: 109 mmol/L (ref 98–111)
Creatinine, Ser: 1.12 mg/dL (ref 0.61–1.24)
GFR calc Af Amer: >60 mL/min (ref >60)
GFR calc non Af Amer: >60 mL/min (ref >60)
Glucose, Bld: 155 mg/dL — ABNORMAL HIGH (ref 70–99)
Potassium: 3.4 mmol/L — ABNORMAL LOW (ref 3.5–5.1)
Sodium: 141 mmol/L (ref 135–145)
Total Bilirubin: 0.3 mg/dL (ref 0.3–1.2)
Total Protein: 5.6 g/dL — ABNORMAL LOW (ref 6.5–8.1)

## 2018-12-12 LAB — CBC WITH DIFFERENTIAL/PLATELET
Abs Immature Granulocytes: 0.07 10*3/uL (ref 0.00–0.07)
Basophils Absolute: 0 10*3/uL (ref 0.0–0.1)
Basophils Relative: 0 %
Eosinophils Absolute: 0 10*3/uL (ref 0.0–0.5)
Eosinophils Relative: 0 %
HCT: 34.4 % — ABNORMAL LOW (ref 39.0–52.0)
Hemoglobin: 10.8 g/dL — ABNORMAL LOW (ref 13.0–17.0)
Immature Granulocytes: 1 %
Lymphocytes Relative: 7 %
Lymphs Abs: 0.4 10*3/uL — ABNORMAL LOW (ref 0.7–4.0)
MCH: 27.7 pg (ref 26.0–34.0)
MCHC: 31.4 g/dL (ref 30.0–36.0)
MCV: 88.2 fL (ref 80.0–100.0)
Monocytes Absolute: 0.5 10*3/uL (ref 0.1–1.0)
Monocytes Relative: 9 %
Neutro Abs: 4.9 10*3/uL (ref 1.7–7.7)
Neutrophils Relative %: 83 %
Platelets: 172 10*3/uL (ref 150–400)
RBC: 3.9 MIL/uL — ABNORMAL LOW (ref 4.22–5.81)
RDW: 15 % (ref 11.5–15.5)
WBC: 5.9 10*3/uL (ref 4.0–10.5)
nRBC: 0 % (ref 0.0–0.2)

## 2018-12-12 LAB — FERRITIN: Ferritin: 319 ng/mL (ref 24–336)

## 2018-12-12 LAB — C-REACTIVE PROTEIN: CRP: 8.8 mg/dL — ABNORMAL HIGH (ref <1.0)

## 2018-12-12 LAB — PROCALCITONIN: Procalcitonin: 1.36 ng/mL

## 2018-12-12 LAB — D-DIMER, QUANTITATIVE: D-Dimer, Quant: 0.76 ug/mL-FEU — ABNORMAL HIGH (ref 0.00–0.50)

## 2018-12-12 MED ORDER — PREDNISONE 20 MG PO TABS
40.0000 mg | ORAL_TABLET | Freq: Every day | ORAL | Status: DC
  Administered 2018-12-13 – 2018-12-16 (×4): 40 mg via ORAL
  Filled 2018-12-12 (×4): qty 2

## 2018-12-12 MED ORDER — SODIUM CHLORIDE 0.9% FLUSH
10.0000 mL | Freq: Two times a day (BID) | INTRAVENOUS | Status: DC
  Administered 2018-12-12 – 2018-12-14 (×6): 10 mL

## 2018-12-12 MED ORDER — SODIUM CHLORIDE 0.9% FLUSH: 10.0000 mL | INTRAVENOUS | Status: DC | PRN

## 2018-12-12 NOTE — Plan of Care (Signed)
IV infiltrated. Dr. Informed  - IV consult placed since diff stick. No access, but will need several more days of IV treatments.  Gave verbal OK for PICC if needed.

## 2018-12-12 NOTE — Progress Notes (Signed)
PROGRESS NOTE                                                                                                                                                                                                             Patient Demographics:    Derrick Bender, is a 73 y.o. male, DOB - [birth year], AC:[id number]  Outpatient Primary MD for the patient is Glenda Chroman, MD   Admit date - 12/09/2018   LOS - 3  No chief complaint on file.      Brief Narrative: Patient is a 73 y.o. male with PMHx of-not on home O2, DM-2-(diet controlled at home), HTN, CVA, seizure disorder, prostate cancer s/p total prostatectomy, OSA on CPAP-who was transferred from [facility] after he was found to have persistent fever, acute hypoxemic respiratory failure in the setting of COVID-19 pneumonia.  His daughter was recently tested positive for COVID-19.   Subjective:    Derrick Bender feels much better-he is now on room air this morning.   Assessment  & Plan :   Acute Hypoxic Resp Failure due to Covid 19 Viral pneumonia with concomitant bacterial pneumonia: Improved-on room air this morning.  Continue Remdesivir, empiric Rocephin and Zithromax-begin tapering steroids.    Fever: afebrile  O2 requirements: On room air (was on 1-1.5L/m yesterday)  COVID-19 Labs: Recent Labs    12/10/18 0600 12/11/18 0350 12/12/18 0330 12/12/18 0335  DDIMER 0.69* 0.75*  --  0.76*  FERRITIN 230 268 319  --   LDH 510*  --   --   --   CRP 14.2* 15.4* 8.8*  --     Lab Results  Component Value Date   SARSCOV2NAA NOT DETECTED 10/14/2018   [place] Not Detected 09/30/2018     COVID-19 Medications: Steroids: Solu-Medrol 8/25>> Remdesivir: 8/25>> Actemra: Not indicated Convalescent Plasma:N/A Research Studies:N/A  Other medications: Diuretics:Euvolemic-no need for lasix Antibiotics: Rocephin/Zithromax 8/25>>  Prone/Incentive Spirometry:  encouraged patient to lie prone for 3-4 hours at a time for a total of 16 hours a day, and to encourage incentive spirometry use 3-4/hour.  DVT Prophylaxis  :  Lovenox   Transaminitis: Likely secondary to COVID-19-downtrending-follow LFTs periodically  AKI: Mild-likely hemodynamically mediated-resolved-follow  COPD: No evidence of exacerbation-continue bronchodilators.  DM-2 (A1c 6.5): Diet controlled at home-CBGs stable with SSI  HTN: Controlled-continue lisinopril and Cardizem.  Seizure disorder: Continue Dilantin  History of CVA: No focal deficits-continue Plavix  History of 13 x 10 mm pituitary lesion: Follows with neurosurgery at [facility]  History of prostate cancer: Stable for outpatient follow-up with his outpatient physicians  Debility/deconditioning: Secondary to acute illness-appreciate PT/OT-recommendations are for home health services.  OSA: Claims uses CPAP at night-currently due to our facility's policy of no CPAP-we will be on home oxygen.    Obesity: Estimated body mass index is 32.23 kg/m as calculated from the following:   Height as of this encounter: 5\' 9"  (1.753 m).   Weight as of this encounter: 99 kg.   ABG: No results found for: PHART, PCO2ART, PO2ART, HCO3, TCO2, ACIDBASEDEF, O2SAT  Vent Settings: N/A   Condition - Stable  Family Communication  :  Spouse over the phone on 8/26-we will update later today  Code Status :  Full Code  Diet :  Diet Order            Diet heart healthy/carb modified Room service appropriate? Yes; Fluid consistency: Thin  Diet effective now               Disposition Plan  :  Remain hospitalized-home later this week if clinical improvement continues  Consults  :  None  Procedures  :  None  Antibiotics  :    Anti-infectives (From admission, onward)   Start     Dose/Rate Route Frequency Ordered Stop   12/11/18 1000  remdesivir 100 mg in sodium chloride 0.9 % 250 mL IVPB     100 mg 500  mL/hr over 30 Minutes Intravenous Every 24 hours 12/10/18 0810 12/15/18 0959   12/10/18 1000  cefTRIAXone (ROCEPHIN) 2 g in sodium chloride 0.9 % 100 mL IVPB    Note to Pharmacy: [facility] listed Keflex as an allergy, but it is not listed as significant in our system. The patient was on Levofloxacin and azithromycin at sending facility.   2 g 200 mL/hr over 30 Minutes Intravenous Every 24 hours 12/10/18 0548 12/15/18 0959   12/10/18 1000  azithromycin (ZITHROMAX) tablet 500 mg     500 mg Oral Daily 12/10/18 0548 12/15/18 0959   12/10/18 1000  remdesivir 200 mg in sodium chloride 0.9 % 250 mL IVPB     200 mg 500 mL/hr over 30 Minutes Intravenous Once 12/10/18 0810 12/10/18 1223      Inpatient Medications  Scheduled Meds: . atorvastatin  10 mg Oral QHS  . azithromycin  500 mg Oral Daily  . benzonatate  200 mg Oral TID  . clopidogrel  75 mg Oral Daily  . diltiazem  180 mg Oral Daily  . enoxaparin (LOVENOX) injection  40 mg Subcutaneous Q24H  . escitalopram  20 mg Oral QHS  . insulin aspart  0-9 Units Subcutaneous TID WC  . Ipratropium-Albuterol  1 puff Inhalation Q6H  . lisinopril  20 mg Oral Daily  . methylPREDNISolone (SOLU-MEDROL) injection  40 mg Intravenous Q12H  . mometasone-formoterol  2 puff Inhalation BID  . oxybutynin  5 mg Oral TID  . pantoprazole  40 mg Oral Daily  . phenytoin  100 mg Oral Q8H  . topiramate  100 mg Oral BID   Continuous Infusions: . cefTRIAXone (ROCEPHIN)  IV 2 g (12/12/18 0837)  . remdesivir 100 mg in NS 250 mL 100 mg (12/11/18 0957)   PRN Meds:.acetaminophen, albuterol, chlorpheniramine-HYDROcodone, diazepam, polyethylene glycol, prochlorperazine   Time Spent in minutes  25  See all Orders from  today for further details   Oren Binet M.D on 12/12/2018 at 1:28 PM  To page go to [email or web address] - use universal password  [facility] -  Office  [phone]    Objective:   Vitals:   12/11/18 1500 12/11/18 1929 12/12/18 0400 12/12/18  0743  BP:  (!) 149/64 (!) 164/72 (!) 154/77  Pulse:  75 67 82  Resp: (!) 28 (!) 29 (!) 32 (!) 27  Temp: 98.6 F (37 C) 99.4 F (37.4 C) 98.6 F (37 C) 98 F (36.7 C)  TempSrc:  Oral Oral Oral  SpO2:  97% 100% 100%  Weight:      Height:        Wt Readings from Last 3 Encounters:  12/10/18 99 kg  11/16/15 100.2 kg     Intake/Output Summary (Last 24 hours) at 12/12/2018 1328 Last data filed at 12/12/2018 0300 Gross per 24 hour  Intake 240 ml  Output -  Net 240 ml     Physical Exam Gen Exam:Alert awake-not in any distress HEENT:atraumatic, normocephalic Chest: B/L clear to auscultation anteriorly CVS:S1S2 regular Abdomen:soft non tender, non distended Extremities:no edema Neurology: Non focal Skin: no rash  Data Review:    CBC Recent Labs  Lab 12/10/18 0600 12/11/18 0350 12/12/18 0335  WBC 4.5 4.3 5.9  HGB 12.1* 11.2* 10.8*  HCT 38.3* 35.0* 34.4*  PLT 153 147* 172  MCV 88.2 88.4 88.2  MCH 27.9 28.3 27.7  MCHC 31.6 32.0 31.4  RDW 14.8 15.0 15.0  LYMPHSABS 1.1 0.5* 0.4*  MONOABS 0.5 0.7 0.5  EOSABS 0.0 0.0 0.0  BASOSABS 0.0 0.0 0.0    Chemistries  Recent Labs  Lab 12/10/18 0600 12/11/18 0350 12/12/18 0335  NA 139 140 141  K 3.6 3.5 3.4*  CL 108 110 109  CO2 22 22 20*  GLUCOSE 111* 197* 155*  BUN 28* 25* 26*  CREATININE 1.26* 1.20 1.12  CALCIUM 8.0* 7.7* 8.0*  MG 1.9 2.3  --   AST 109* 72* 61*  ALT 57* 47* 44  ALKPHOS 91 78 75  BILITOT 0.6 0.6 0.3   ------------------------------------------------------------------------------------------------------------------ Recent Labs    12/10/18 0600  TRIG 215*    Lab Results  Component Value Date   HGBA1C 6.5 (H) 12/10/2018   ------------------------------------------------------------------------------------------------------------------ No results for input(s): TSH, T4TOTAL, T3FREE, THYROIDAB in the last 72 hours.  Invalid input(s): FREET3  ------------------------------------------------------------------------------------------------------------------ Recent Labs    12/11/18 0350 12/12/18 0330  FERRITIN 268 319    Coagulation profile No results for input(s): INR, PROTIME in the last 168 hours.  Recent Labs    12/11/18 0350 12/12/18 0335  DDIMER 0.75* 0.76*    Cardiac Enzymes No results for input(s): CKMB, TROPONINI, MYOGLOBIN in the last 168 hours.  Invalid input(s): CK ------------------------------------------------------------------------------------------------------------------    Component Value Date/Time   BNP 36.3 12/10/2018 0600    Micro Results No results found for this or any previous visit (from the past 240 hour(s)).  Radiology Reports Dg Chest Port 1 View  Result Date: 12/10/2018 CLINICAL DATA:  Pneumonia due to COVID-19 EXAM: PORTABLE CHEST 1 VIEW COMPARISON:  Portable exam 0620 hours compared to 12/09/2018 FINDINGS: Enlargement of cardiac silhouette. Tortuous aorta. Pulmonary vascularity normal. BILATERAL pulmonary infiltrates again seen, little changed. No pleural effusion or pneumothorax. Bones unremarkable. IMPRESSION: Persistent pulmonary infiltrates bilaterally. Electronically Signed   By: Lavonia Dana M.D.   On: 12/10/2018 08:59

## 2018-12-12 NOTE — Progress Notes (Signed)
Charge RN transferred call from pts wife to room. Pt answered. NS gave wife pts direct phone number to room

## 2018-12-12 NOTE — Progress Notes (Addendum)
Physical Therapy Treatment Patient Details Name: Derrick Bender MRN: [id number] DOB: 02-08-1946 Today's Date: 12/12/2018    History of Present Illness Patient is a 73 y.o. male with PMHx including, DM-2, HTN, CVA, seizure disorder, prostate cancer s/p total prostatectomy. Presented to ED with persistent fever, acute hypoxemic respiratory failure (not typically on home O2) in the setting of COVID-19 pneumonia.    PT Comments    The patient  Mobilizes slowly,  Still requires RW for support. Patient ambulated on RAwith SPO2 dropping to 88 % and noted 3/4 dyspnea. Patient was anxious when ambulated in room with mask to head into hall and stated that he could not breathe. Masked removed and remained in room. Patient is very far from his indpendent baseline. Will need another saturation walk test prior to Dc.  Follow Up Recommendations  Home health PT     Equipment Recommendations  Rolling walker with 5" wheels    Recommendations for Other Services       Precautions / Restrictions Precautions Precautions: Fall Precaution Comments: watch sats    Mobility  Bed Mobility Overal bed mobility: Needs Assistance Bed Mobility: Supine to Sit;Sit to Supine     Supine to sit: Supervision   Sit to sidelying: Min assist General bed mobility comments: extra time, use of bed rail, assist legs back onto bed  Transfers   Equipment used: Rolling walker (2 wheeled) Transfers: Sit to/from Stand   Stand pivot transfers: Min guard       General transfer comment: stands without support  Ambulation/Gait Ambulation/Gait assistance: Min assist;Min guard Gait Distance (Feet): 45 Feet Assistive device: Rolling walker (2 wheeled) Gait Pattern/deviations: Step-to pattern;Step-through pattern;Shuffle Gait velocity: decr   General Gait Details: placed mask on pt. reports he cannot breathe so removed and amb. in room on RA   Stairs             Wheelchair Mobility    Modified Rankin  (Stroke Patients Only)       Balance   Sitting-balance support: Bilateral upper extremity supported Sitting balance-Leahy Scale: Good     Standing balance support: Bilateral upper extremity supported;During functional activity Standing balance-Leahy Scale: Fair                              Cognition Arousal/Alertness: Awake/alert Behavior During Therapy: WFL for tasks assessed/performed;Flat affect                                          Exercises      General Comments        Pertinent Vitals/Pain Pain Assessment: No/denies pain    Home Living                      Prior Function            PT Goals (current goals can now be found in the care plan section) Progress towards PT goals: Progressing toward goals    Frequency    Min 3X/week      PT Plan Current plan remains appropriate    Co-evaluation              AM-PAC PT "6 Clicks" Mobility   Outcome Measure  Help needed turning from your back to your side while in a flat bed without using bedrails?: A Little Help  needed moving from lying on your back to sitting on the side of a flat bed without using bedrails?: A Little Help needed moving to and from a bed to a chair (including a wheelchair)?: A Little Help needed standing up from a chair using your arms (e.g., wheelchair or bedside chair)?: A Little Help needed to walk in hospital room?: A Little Help needed climbing 3-5 steps with a railing? : A Lot 6 Click Score: 17    End of Session Equipment Utilized During Treatment: Gait belt Activity Tolerance: Patient limited by fatigue Patient left: in bed;with call bell/phone within reach Nurse Communication: Mobility status PT Visit Diagnosis: Difficulty in walking, not elsewhere classified (R26.2);Unsteadiness on feet (R26.81)     Time: [id number] PT Time Calculation (min) (ACUTE ONLY): 48 min  Charges:  $Gait Training: 23-37 mins $Therapeutic Activity:  8-22 mins                     Derrick Bender PT Acute Rehabilitation Services  Office [phone]    Claretha Cooper 12/12/2018, 4:42 PM

## 2018-12-13 LAB — CBC WITH DIFFERENTIAL/PLATELET
Abs Immature Granulocytes: 0.15 10*3/uL — ABNORMAL HIGH (ref 0.00–0.07)
Basophils Absolute: 0 10*3/uL (ref 0.0–0.1)
Basophils Relative: 0 %
Eosinophils Absolute: 0 10*3/uL (ref 0.0–0.5)
Eosinophils Relative: 0 %
HCT: 34 % — ABNORMAL LOW (ref 39.0–52.0)
Hemoglobin: 10.8 g/dL — ABNORMAL LOW (ref 13.0–17.0)
Immature Granulocytes: 2 %
Lymphocytes Relative: 10 %
Lymphs Abs: 1 10*3/uL (ref 0.7–4.0)
MCH: 28.2 pg (ref 26.0–34.0)
MCHC: 31.8 g/dL (ref 30.0–36.0)
MCV: 88.8 fL (ref 80.0–100.0)
Monocytes Absolute: 0.8 10*3/uL (ref 0.1–1.0)
Monocytes Relative: 8 %
Neutro Abs: 8.2 10*3/uL — ABNORMAL HIGH (ref 1.7–7.7)
Neutrophils Relative %: 80 %
Platelets: 223 10*3/uL (ref 150–400)
RBC: 3.83 MIL/uL — ABNORMAL LOW (ref 4.22–5.81)
RDW: 14.9 % (ref 11.5–15.5)
WBC: 10.2 10*3/uL (ref 4.0–10.5)
nRBC: 0.2 % (ref 0.0–0.2)

## 2018-12-13 LAB — GLUCOSE, CAPILLARY
Glucose-Capillary: 111 mg/dL — ABNORMAL HIGH (ref 70–99)
Glucose-Capillary: 185 mg/dL — ABNORMAL HIGH (ref 70–99)
Glucose-Capillary: 354 mg/dL — ABNORMAL HIGH (ref 70–99)

## 2018-12-13 LAB — COMPREHENSIVE METABOLIC PANEL
ALT: 44 U/L (ref 0–44)
AST: 51 U/L — ABNORMAL HIGH (ref 15–41)
Albumin: 2.5 g/dL — ABNORMAL LOW (ref 3.5–5.0)
Alkaline Phosphatase: 76 U/L (ref 38–126)
Anion gap: 8 (ref 5–15)
BUN: 31 mg/dL — ABNORMAL HIGH (ref 8–23)
CO2: 25 mmol/L (ref 22–32)
Calcium: 8 mg/dL — ABNORMAL LOW (ref 8.9–10.3)
Chloride: 110 mmol/L (ref 98–111)
Creatinine, Ser: 1.2 mg/dL (ref 0.61–1.24)
GFR calc Af Amer: >60 mL/min (ref >60)
GFR calc non Af Amer: 60 mL/min — ABNORMAL LOW (ref >60)
Glucose, Bld: 147 mg/dL — ABNORMAL HIGH (ref 70–99)
Potassium: 3.3 mmol/L — ABNORMAL LOW (ref 3.5–5.1)
Sodium: 143 mmol/L (ref 135–145)
Total Bilirubin: 0.4 mg/dL (ref 0.3–1.2)
Total Protein: 5.8 g/dL — ABNORMAL LOW (ref 6.5–8.1)

## 2018-12-13 LAB — C-REACTIVE PROTEIN: CRP: 3.6 mg/dL — ABNORMAL HIGH (ref <1.0)

## 2018-12-13 LAB — FERRITIN: Ferritin: 227 ng/mL (ref 24–336)

## 2018-12-13 LAB — D-DIMER, QUANTITATIVE: D-Dimer, Quant: 0.59 ug/mL-FEU — ABNORMAL HIGH (ref 0.00–0.50)

## 2018-12-13 MED ORDER — POTASSIUM CHLORIDE CRYS ER 20 MEQ PO TBCR
40.0000 meq | EXTENDED_RELEASE_TABLET | Freq: Once | ORAL | Status: AC
  Administered 2018-12-13: 40 meq via ORAL
  Filled 2018-12-13: qty 2

## 2018-12-13 NOTE — Progress Notes (Signed)
Physical Therapy Treatment Patient Details Name: Derrick Bender MRN: [id number] DOB: [birth year] Today's Date: 12/13/2018    History of Present Illness Patient is a 73 y.o. male with PMHx including, DM-2, HTN, CVA, seizure disorder, prostate cancer s/p total prostatectomy. Presented to ED with persistent fever, acute hypoxemic respiratory failure (not typically on home O2) in the setting of COVID-19 pneumonia.   PT Comments    Pt slowly progressing with mobility. Able to ambulate short distance in room with RW and min guard, requiring assist for standing ADL task. Pt limited by decreased activity tolerance, easily fatigued with minimal activity. SpO2 >94% on RA with mobility; HR up to 125 (RN aware). Pt hopeful to return home with assist from brother and wife. Will continue to follow acutely.    Follow Up Recommendations  Home health PT;Supervision/Assistance - 24 hour     Equipment Recommendations  Rolling walker with 5" wheels;3in1 (PT)    Recommendations for Other Services       Precautions / Restrictions Precautions Precautions: Fall Precaution Comments: Tachycardia, tachypneic Restrictions Weight Bearing Restrictions: No    Mobility  Bed Mobility Overal bed mobility: Needs Assistance Bed Mobility: Supine to Sit Rolling: Supervision   Supine to sit: Modified independent (Device/Increase time)     General bed mobility comments: Use of bed rail, HOB slightly elevated; increased time and effort  Transfers Overall transfer level: Needs assistance Equipment used: Rolling walker (2 wheeled) Transfers: Sit to/from Stand Sit to Stand: Supervision;Min guard         General transfer comment: Initial min guard standing from EOB. Stood 2x from recliner to [employer] with supervision, heavy reliance on UE support; good hand placement without cues  Ambulation/Gait Ambulation/Gait assistance: Counsellor (Feet): 24 Feet Assistive device: Rolling walker (2  wheeled) Gait Pattern/deviations: Step-to pattern;Shuffle;Trunk flexed Gait velocity: Decreased Gait velocity interpretation: <1.31 ft/sec, indicative of household ambulator General Gait Details: Steps to recliner with prolonged standing for washup due to urine incontinence, seated rest; very slow amb 24' with RW and min guard, pt declining further distance due to fatigue. SpO2 >94% on RA, HR up to 125, increased respiration rate   Stairs             Wheelchair Mobility    Modified Rankin (Stroke Patients Only)       Balance Overall balance assessment: Needs assistance   Sitting balance-Leahy Scale: Good         Standing balance comment: Can static stand without UE support but unable to accept challenge; reliant on assist for posterior pericare while standing                            Cognition Arousal/Alertness: Lethargic Behavior During Therapy: WFL for tasks assessed/performed Overall Cognitive Status: No family/caregiver present to determine baseline cognitive functioning Area of Impairment: Attention;Following commands;Awareness;Problem solving                   Current Attention Level: Selective   Following Commands: Follows multi-step commands inconsistently   Awareness: Emergent Problem Solving: Slow processing;Requires verbal cues General Comments: Initially lethargic, minimal conversation and talking soft; alert with mobility and conversant towards end of session. Very fatigued. Required frequent cues to redirect conversation      Exercises      General Comments        Pertinent Vitals/Pain Pain Assessment: No/denies pain    Home Living  Prior Function            PT Goals (current goals can now be found in the care plan section) Progress towards PT goals: Progressing toward goals    Frequency    Min 3X/week      PT Plan Current plan remains appropriate    Co-evaluation               AM-PAC PT "6 Clicks" Mobility   Outcome Measure  Help needed turning from your back to your side while in a flat bed without using bedrails?: A Little Help needed moving from lying on your back to sitting on the side of a flat bed without using bedrails?: A Little Help needed moving to and from a bed to a chair (including a wheelchair)?: A Little Help needed standing up from a chair using your arms (e.g., wheelchair or bedside chair)?: A Little Help needed to walk in hospital room?: A Little Help needed climbing 3-5 steps with a railing? : A Lot 6 Click Score: 17    End of Session Equipment Utilized During Treatment: Gait belt Activity Tolerance: Patient limited by fatigue Patient left: in chair;with call bell/phone within reach;with chair alarm set Nurse Communication: Mobility status PT Visit Diagnosis: Difficulty in walking, not elsewhere classified (R26.2);Unsteadiness on feet (R26.81)     Time: [id number] PT Time Calculation (min) (ACUTE ONLY): 31 min  Charges:  $Gait Training: 8-22 mins $Therapeutic Activity: 8-22 mins                    Mabeline Caras, PT, DPT Acute Rehabilitation Services  Pager [phone] Office [place] 12/13/2018, 5:05 PM

## 2018-12-13 NOTE — Progress Notes (Signed)
Occupational Therapy Treatment Patient Details Name: Derrick Bender MRN: [id number] DOB: Nov 02, 1945 Today's Date: 12/13/2018    History of present illness Patient is a 73 y.o. male with PMHx including, DM-2, HTN, CVA, seizure disorder, prostate cancer s/p total prostatectomy. Presented to ED with persistent fever, acute hypoxemic respiratory failure (not typically on home O2) in the setting of COVID-19 pneumonia.   OT comments  Pt making limited progress towards OT goals this session. Pt mod A for LB bathing/dressing, min guard/supervision for SPT to [facility] and then very short ambulation to bed, min guard to return to supine. Pt required cues throughout to slow breathing (constantly hovered around 30+), On RA throughout session, O2 ranged from 83-95, at the end of the session in supine, it was consistently around 88, so applied 1.5L via [place]. OT will continue to follow acutely. Will need to monitor closely for need for SNF should activity tolerance not progress.   Follow Up Recommendations  Home health OT;Supervision/Assistance - 24 hour(watch for progress)    Equipment Recommendations  3 in 1 bedside commode    Recommendations for Other Services PT consult    Precautions / Restrictions Precautions Precautions: Fall Precaution Comments: Tachycardia, tachypneic Restrictions Weight Bearing Restrictions: No       Mobility Bed Mobility Overal bed mobility: Needs Assistance Bed Mobility: Sit to Supine Rolling: Supervision   Supine to sit: Modified independent (Device/Increase time) Sit to supine: Min guard   General bed mobility comments: Use of bed rail, HOB slightly elevated; increased time and effort  Transfers Overall transfer level: Needs assistance Equipment used: Rolling walker (2 wheeled) Transfers: Sit to/from Stand Sit to Stand: Supervision;Min guard Stand pivot transfers: Min guard       General transfer comment: Initial min guard standing from EOB. Stood 2x from  recliner to [employer] with supervision, heavy reliance on UE support; good hand placement without cues    Balance Overall balance assessment: Needs assistance Sitting-balance support: Bilateral upper extremity supported Sitting balance-Leahy Scale: Good     Standing balance support: Bilateral upper extremity supported;During functional activity Standing balance-Leahy Scale: Fair Standing balance comment: Can static stand without UE support but unable to accept challenge; reliant on assist for posterior pericare while standing                           ADL either performed or assessed with clinical judgement   ADL Overall ADL's : Needs assistance/impaired     Grooming: Set up;Wash/dry hands;Wash/dry face;Sitting Grooming Details (indicate cue type and reason): on [facility]     Lower Body Bathing: Moderate assistance;Sitting/lateral leans       Lower Body Dressing: Moderate assistance;Sit to/from stand Lower Body Dressing Details (indicate cue type and reason): to don new socks, able to pull up after over the toes Toilet Transfer: Min guard;Stand-pivot;BSC;RW   Toileting- Clothing Manipulation and Hygiene: Maximal assistance Toileting - Clothing Manipulation Details (indicate cue type and reason): Pt able to stand with RW, then OT performed rear peri care     Functional mobility during ADLs: Min guard;Rolling walker General ADL Comments: decreased activity tolerance     Vision       Perception     Praxis      Cognition Arousal/Alertness: Lethargic Behavior During Therapy: WFL for tasks assessed/performed Overall Cognitive Status: No family/caregiver present to determine baseline cognitive functioning Area of Impairment: Attention;Following commands;Awareness;Problem solving  Current Attention Level: Selective   Following Commands: Follows multi-step commands inconsistently   Awareness: Emergent Problem Solving: Slow processing;Requires  verbal cues General Comments: when he is horizontal, he is more lethargic, upright he perks up and is conversant        Exercises     Shoulder Instructions       General Comments      Pertinent Vitals/ Pain       Pain Assessment: No/denies pain  Home Living                                          Prior Functioning/Environment              Frequency  Min 3X/week        Progress Toward Goals  OT Goals(current goals can now be found in the care plan section)  Progress towards OT goals: Progressing toward goals  Acute Rehab OT Goals 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 OT Goal Formulation: With patient Time For Goal Achievement: 12/25/18 Potential to Achieve Goals: Good  Plan Discharge plan remains appropriate(but monitor closely, possibly SNF?)    Co-evaluation                 AM-PAC OT "6 Clicks" Daily Activity     Outcome Measure   Help from another person eating meals?: None Help from another person taking care of personal grooming?: A Little Help from another person toileting, which includes using toliet, bedpan, or urinal?: A Lot Help from another person bathing (including washing, rinsing, drying)?: A Lot Help from another person to put on and taking off regular upper body clothing?: A Little Help from another person to put on and taking off regular lower body clothing?: A Lot 6 Click Score: 16    End of Session Equipment Utilized During Treatment: Gait belt;Rolling walker  OT Visit Diagnosis: Unsteadiness on feet (R26.81);Other abnormalities of gait and mobility (R26.89);Muscle weakness (generalized) (M62.81)   Activity Tolerance Patient tolerated treatment well   Patient Left in bed;with call bell/phone within reach;with bed alarm set   Nurse Communication Mobility status;Other (comment)(O2 back on, RR)        Time: [id number] OT Time Calculation (min): 31 min  Charges: OT General Charges $OT Visit: 1  Visit OT Treatments $Self Care/Home Management : 23-37 mins  Hulda Humphrey OTR/L Acute Rehabilitation Services Pager: [phone] Office: [place] 12/13/2018, 6:39 PM

## 2018-12-13 NOTE — Plan of Care (Addendum)
Updated patient's wife Stanton Kidney. She states she has also spoken to the MD and has no further questions at this time.  11:26 Patient has been tachycardic since getting up to bedside commode. BP is elevated. Patient is more tachypnic. Patient denies feeling bad. Notified MD of changes in vitals. Will obtain 12 lead EKG.    12:00 EKG showed Sinus Tach, MD notified. Will give valium and administer O2 via nasal cannula for comfort. Patient states he is nervous and more short of breath.   13:30 Patient is still tachypnic but patient states that is unchanged for him. HR is below 120's in 110s. BP is more stable. Patient is resting well in bed. Paged MD to update on condition. No further orders obtained. Will continue to monitor patient for any acute changes and update MD as needed.   15:10 patient sitting up in the chair after working with PT. Patient states that he feels better since he has been able to rest. Patient is on room air. Breathing is not as labored and his HR is more stable.

## 2018-12-13 NOTE — Plan of Care (Signed)
  Problem: Education: Goal: Knowledge of General Education information will improve Description: Including pain rating scale, medication(s)/side effects and non-pharmacologic comfort measures Outcome: Progressing   Problem: Nutrition: Goal: Adequate nutrition will be maintained Outcome: Progressing   Problem: Elimination: Goal: Will not experience complications related to bowel motility Outcome: Progressing   

## 2018-12-13 NOTE — TOC Initial Note (Signed)
Transition of Care [facility]) - Initial/Assessment Note    Patient Details  Name: Derrick Bender MRN: [id number] Date of Birth: [birth year]  Transition of [facility]) CM/SW Contact:    Weston Anna, LCSW Phone Number: 12/13/2018, 3:43 PM  Clinical Narrative:                  CSW spoke with patients spouse, Stanton Kidney, to discuss discharge plans- spouse agreeable to [facility] at this time. CSW informed spouse of limited [place] options due to patient living in the [place] area- spouse agreeable to [facility].   CSW completed referral for [facility] with [facility]us @ [phone]. Referral was accepted for PT/OT- all orders placed by MD and faxed. Barnabas Harries will just need copy of DC summary once discharged.   No other needs at this time. Patient will be returning home with [facility].   Expected Discharge Plan: [place] Barriers to Discharge: Continued Medical Work up   Patient Goals and CMS Choice     Choice offered to / list presented to : Spouse(mary)  Expected Discharge Plan and Services Expected Discharge Plan: [place] In-house Referral: Clinical Social Work   Post Acute Care Choice: [place] arrangements for the past 2 months: [place]                 DME Arranged: N/A         HH Arranged: PT, OT [place] Agency: [place]) Date HH Agency Contacted: 12/13/18 Time [place]: [phone] Representative spoke with at [place]: Madaline Savage  Prior Living Arrangements/Services Living arrangements for the past 2 months: [place] Lives with:: Spouse Patient language and need for interpreter reviewed:: Yes Do you feel safe going back to the place where you live?: Yes      Need for Family Participation in Patient Care: Yes (Comment) Care giver support system in place?: Yes (comment)      Activities of Daily Living Home Assistive Devices/Equipment: None ADL Screening (condition at time of  admission) Patient's cognitive ability adequate to safely complete daily activities?: Yes Is the patient deaf or have difficulty hearing?: No Does the patient have difficulty seeing, even when wearing glasses/contacts?: No Does the patient have difficulty concentrating, remembering, or making decisions?: No Patient able to express need for assistance with ADLs?: Yes Does the patient have difficulty dressing or bathing?: No Independently performs ADLs?: Yes (appropriate for developmental age) Does the patient have difficulty walking or climbing stairs?: No Weakness of Legs: None Weakness of Arms/Hands: None  Permission Sought/Granted                  Emotional Assessment           Psych Involvement: No (comment)  Admission diagnosis:  COVID-19 VIRUS INFECTION Patient Active Problem List   Diagnosis Date Noted  . Pneumonia due to COVID-19 virus 12/10/2018  . COPD (chronic obstructive pulmonary disease) ([place])   . Hyperlipidemia   . Seizure ([place])   . Depression   . Hypertension   . Type 2 diabetes mellitus ([place])    PCP:  Glenda Chroman, MD Pharmacy:   [facility], [place] [address] Phone: [phone] Fax: [phone]  [id number] [place], [place] [address] [place] [address] Phone: [phone] Fax: [phone]     Social Determinants of Health ([place])  Interventions    Readmission Risk Interventions No flowsheet data found.

## 2018-12-13 NOTE — Progress Notes (Signed)
Pt with a RR = 44 at rest in  bed, O2@2lnc  was applied after use of Dulera & combivent & use of IS.  Temp =102.0

## 2018-12-13 NOTE — Progress Notes (Signed)
PROGRESS NOTE                                                                                                                                                                                                             Patient Demographics:    Derrick Bender, is a 73 y.o. male, DOB - Aug 16, 1945, [id number]  Outpatient Primary MD for the patient is Glenda Chroman, MD   Admit date - 12/09/2018   LOS - 4  No chief complaint on file.      Brief Narrative: Patient is a 73 y.o. male with PMHx of-not on home O2, DM-2-(diet controlled at home), HTN, CVA, seizure disorder, prostate cancer s/p total prostatectomy, OSA on CPAP-who was transferred from [facility] after he was found to have persistent fever, acute hypoxemic respiratory failure in the setting of COVID-19 pneumonia.  His daughter was recently tested positive for COVID-19.   Subjective:   Feels better-due to OSA-on nocturnal O2-but on room air this morning.   Assessment  & Plan :   Acute Hypoxic Resp Failure due to Covid 19 Viral pneumonia with concomitant bacterial pneumonia: Improved-on room air.  Continue Remdesivir/Rocephin/Zithromax-stop date of 8/30.  Continue to taper steroids.   Fever: afebrile  O2 requirements: On room air (on room air)  COVID-19 Labs: Recent Labs    12/11/18 0350 12/12/18 0330 12/12/18 0335 12/13/18 0134  DDIMER 0.75*  --  0.76* 0.59*  FERRITIN 268 319  --  227  CRP 15.4* 8.8*  --  3.6*    Lab Results  Component Value Date   SARSCOV2NAA NOT DETECTED 10/14/2018   [place] Not Detected 09/30/2018     COVID-19 Medications: Steroids: Solu-Medrol 8/25>> Remdesivir: 8/25>> Actemra: Not indicated Convalescent Plasma:N/A Research Studies:N/A  Other medications: Diuretics:Euvolemic-no need for lasix Antibiotics: Rocephin/Zithromax 8/25>>  Prone/Incentive Spirometry: encouraged patient to lie prone for 3-4 hours at a  time for a total of 16 hours a day, and to encourage incentive spirometry use 3-4/hour.  DVT Prophylaxis  :  Lovenox   Transaminitis: Likely secondary to COVID-19-downtrending-follow LFTs periodically  AKI: Mild-likely hemodynamically mediated-resolved-follow  COPD: No evidence of exacerbation-continue bronchodilators.  DM-2 (A1c 6.5): Diet controlled at home-CBGs stable with SSI  HTN: Controlled-continue lisinopril and Cardizem.     Seizure disorder: Continue Dilantin  History of CVA: No focal deficits-continue Plavix  History  of 13 x 10 mm pituitary lesion: Follows with neurosurgery at [facility]  History of prostate cancer: Stable for outpatient follow-up with his outpatient physicians  Debility/deconditioning: Secondary to acute illness-appreciate PT/OT-recommendations are for home health services.  OSA: Claims uses CPAP at night-currently due to our facility's policy of no CPAP-we will be on home oxygen.    Obesity: Estimated body mass index is 32.23 kg/m as calculated from the following:   Height as of this encounter: 5\' 9"  (1.753 m).   Weight as of this encounter: 99 kg.   ABG: No results found for: PHART, PCO2ART, PO2ART, HCO3, TCO2, ACIDBASEDEF, O2SAT  Vent Settings: N/A   Condition - Stable  Family Communication  :  Spouse over the phone on 8/30  Code Status :  Full Code  Diet :  Diet Order            Diet heart healthy/carb modified Room service appropriate? Yes; Fluid consistency: Thin  Diet effective now               Disposition Plan  :  Remain hospitalized-home on   Consults  :  None  Procedures  :  None  Antibiotics  :    Anti-infectives (From admission, onward)   Start     Dose/Rate Route Frequency Ordered Stop   12/11/18 1000  remdesivir 100 mg in sodium chloride 0.9 % 250 mL IVPB     100 mg 500 mL/hr over 30 Minutes Intravenous Every 24 hours 12/10/18 0810 12/15/18 0959   12/10/18 1000  cefTRIAXone (ROCEPHIN) 2 g  in sodium chloride 0.9 % 100 mL IVPB    Note to Pharmacy: [facility] listed Keflex as an allergy, but it is not listed as significant in our system. The patient was on Levofloxacin and azithromycin at sending facility.   2 g 200 mL/hr over 30 Minutes Intravenous Every 24 hours 12/10/18 0548 12/15/18 0959   12/10/18 1000  azithromycin (ZITHROMAX) tablet 500 mg     500 mg Oral Daily 12/10/18 0548 12/15/18 0959   12/10/18 1000  remdesivir 200 mg in sodium chloride 0.9 % 250 mL IVPB     200 mg 500 mL/hr over 30 Minutes Intravenous Once 12/10/18 0810 12/10/18 1223      Inpatient Medications  Scheduled Meds: . atorvastatin  10 mg Oral QHS  . azithromycin  500 mg Oral Daily  . benzonatate  200 mg Oral TID  . clopidogrel  75 mg Oral Daily  . diltiazem  180 mg Oral Daily  . enoxaparin (LOVENOX) injection  40 mg Subcutaneous Q24H  . escitalopram  20 mg Oral QHS  . insulin aspart  0-9 Units Subcutaneous TID WC  . Ipratropium-Albuterol  1 puff Inhalation Q6H  . lisinopril  20 mg Oral Daily  . mometasone-formoterol  2 puff Inhalation BID  . oxybutynin  5 mg Oral TID  . pantoprazole  40 mg Oral Daily  . phenytoin  100 mg Oral Q8H  . predniSONE  40 mg Oral Q breakfast  . sodium chloride flush  10-40 mL Intracatheter Q12H  . topiramate  100 mg Oral BID   Continuous Infusions: . cefTRIAXone (ROCEPHIN)  IV 2 g (12/13/18 1042)  . remdesivir 100 mg in NS 250 mL 100 mg (12/13/18 0945)   PRN Meds:.acetaminophen, albuterol, chlorpheniramine-HYDROcodone, diazepam, polyethylene glycol, prochlorperazine, sodium chloride flush   Time Spent in minutes  25  See all Orders from today for further details   Oren Binet M.D on 12/13/2018  at 10:48 AM  To page go to [email or web address] - use universal password  [facility] -  Office  [phone]    Objective:   Vitals:   12/13/18 0400 12/13/18 0420 12/13/18 0647 12/13/18 0747  BP:  (!) 161/74  (!) 156/76  Pulse: 78 78 73 79  Resp: (!) 22   (!)  22  Temp:  99.6 F (37.6 C)  98.5 F (36.9 C)  TempSrc:  Oral  Oral  SpO2: 100% 96% 100% 99%  Weight:      Height:        Wt Readings from Last 3 Encounters:  12/10/18 99 kg  11/16/15 100.2 kg     Intake/Output Summary (Last 24 hours) at 12/13/2018 1048 Last data filed at 12/12/2018 2148 Gross per 24 hour  Intake -  Output 400 ml  Net -400 ml     Physical Exam Gen Exam:Alert awake-not in any distress HEENT:atraumatic, normocephalic Chest: B/L clear to auscultation anteriorly CVS:S1S2 regular Abdomen:soft non tender, non distended Extremities:no edema Neurology: Non focal Skin: no rash   Data Review:    CBC Recent Labs  Lab 12/10/18 0600 12/11/18 0350 12/12/18 0335 12/13/18 0134  WBC 4.5 4.3 5.9 10.2  HGB 12.1* 11.2* 10.8* 10.8*  HCT 38.3* 35.0* 34.4* 34.0*  PLT 153 147* 172 223  MCV 88.2 88.4 88.2 88.8  MCH 27.9 28.3 27.7 28.2  MCHC 31.6 32.0 31.4 31.8  RDW 14.8 15.0 15.0 14.9  LYMPHSABS 1.1 0.5* 0.4* 1.0  MONOABS 0.5 0.7 0.5 0.8  EOSABS 0.0 0.0 0.0 0.0  BASOSABS 0.0 0.0 0.0 0.0    Chemistries  Recent Labs  Lab 12/10/18 0600 12/11/18 0350 12/12/18 0335 12/13/18 0134  NA 139 140 141 143  K 3.6 3.5 3.4* 3.3*  CL 108 110 109 110  CO2 22 22 20* 25  GLUCOSE 111* 197* 155* 147*  BUN 28* 25* 26* 31*  CREATININE 1.26* 1.20 1.12 1.20  CALCIUM 8.0* 7.7* 8.0* 8.0*  MG 1.9 2.3  --   --   AST 109* 72* 61* 51*  ALT 57* 47* 44 44  ALKPHOS 91 78 75 76  BILITOT 0.6 0.6 0.3 0.4   ------------------------------------------------------------------------------------------------------------------ No results for input(s): CHOL, HDL, LDLCALC, TRIG, CHOLHDL, LDLDIRECT in the last 72 hours.  Lab Results  Component Value Date   HGBA1C 6.5 (H) 12/10/2018   ------------------------------------------------------------------------------------------------------------------ No results for input(s): TSH, T4TOTAL, T3FREE, THYROIDAB in the last 72 hours.  Invalid  input(s): FREET3 ------------------------------------------------------------------------------------------------------------------ Recent Labs    12/12/18 0330 12/13/18 0134  FERRITIN 319 227    Coagulation profile No results for input(s): INR, PROTIME in the last 168 hours.  Recent Labs    12/12/18 0335 12/13/18 0134  DDIMER 0.76* 0.59*    Cardiac Enzymes No results for input(s): CKMB, TROPONINI, MYOGLOBIN in the last 168 hours.  Invalid input(s): CK ------------------------------------------------------------------------------------------------------------------    Component Value Date/Time   BNP 36.3 12/10/2018 0600    Micro Results No results found for this or any previous visit (from the past 240 hour(s)).  Radiology Reports Dg Chest Port 1 View  Result Date: 12/10/2018 CLINICAL DATA:  Pneumonia due to COVID-19 EXAM: PORTABLE CHEST 1 VIEW COMPARISON:  Portable exam 0620 hours compared to 12/09/2018 FINDINGS: Enlargement of cardiac silhouette. Tortuous aorta. Pulmonary vascularity normal. BILATERAL pulmonary infiltrates again seen, little changed. No pleural effusion or pneumothorax. Bones unremarkable. IMPRESSION: Persistent pulmonary infiltrates bilaterally. Electronically Signed   By: Lavonia Dana M.D.   On: 12/10/2018 08:59

## 2018-12-14 ENCOUNTER — Inpatient Hospital Stay (HOSPITAL_COMMUNITY): Payer: Medicare PPO

## 2018-12-14 LAB — GLUCOSE, CAPILLARY
Glucose-Capillary: 240 mg/dL — ABNORMAL HIGH (ref 70–99)
Glucose-Capillary: 238 mg/dL — ABNORMAL HIGH (ref 70–99)
Glucose-Capillary: 295 mg/dL — ABNORMAL HIGH (ref 70–99)

## 2018-12-14 LAB — CBC WITH DIFFERENTIAL/PLATELET
Abs Immature Granulocytes: 0.18 10*3/uL — ABNORMAL HIGH (ref 0.00–0.07)
Basophils Absolute: 0 10*3/uL (ref 0.0–0.1)
Basophils Relative: 0 %
Eosinophils Absolute: 0 10*3/uL (ref 0.0–0.5)
Eosinophils Relative: 0 %
HCT: 34.6 % — ABNORMAL LOW (ref 39.0–52.0)
Hemoglobin: 10.9 g/dL — ABNORMAL LOW (ref 13.0–17.0)
Immature Granulocytes: 2 %
Lymphocytes Relative: 10 %
Lymphs Abs: 1 10*3/uL (ref 0.7–4.0)
MCH: 28.2 pg (ref 26.0–34.0)
MCHC: 31.5 g/dL (ref 30.0–36.0)
MCV: 89.4 fL (ref 80.0–100.0)
Monocytes Absolute: 0.8 10*3/uL (ref 0.1–1.0)
Monocytes Relative: 8 %
Neutro Abs: 7.7 10*3/uL (ref 1.7–7.7)
Neutrophils Relative %: 80 %
Platelets: 261 10*3/uL (ref 150–400)
RBC: 3.87 MIL/uL — ABNORMAL LOW (ref 4.22–5.81)
RDW: 15.3 % (ref 11.5–15.5)
WBC: 9.7 10*3/uL (ref 4.0–10.5)
nRBC: 1.1 % — ABNORMAL HIGH (ref 0.0–0.2)

## 2018-12-14 LAB — COMPREHENSIVE METABOLIC PANEL
ALT: 37 U/L (ref 0–44)
AST: 32 U/L (ref 15–41)
Albumin: 2.3 g/dL — ABNORMAL LOW (ref 3.5–5.0)
Alkaline Phosphatase: 81 U/L (ref 38–126)
Anion gap: 9 (ref 5–15)
BUN: 30 mg/dL — ABNORMAL HIGH (ref 8–23)
CO2: 25 mmol/L (ref 22–32)
Calcium: 8.2 mg/dL — ABNORMAL LOW (ref 8.9–10.3)
Chloride: 110 mmol/L (ref 98–111)
Creatinine, Ser: 1.31 mg/dL — ABNORMAL HIGH (ref 0.61–1.24)
GFR calc Af Amer: >60 mL/min (ref >60)
GFR calc non Af Amer: 54 mL/min — ABNORMAL LOW (ref >60)
Glucose, Bld: 163 mg/dL — ABNORMAL HIGH (ref 70–99)
Potassium: 3.3 mmol/L — ABNORMAL LOW (ref 3.5–5.1)
Sodium: 144 mmol/L (ref 135–145)
Total Bilirubin: 0.2 mg/dL — ABNORMAL LOW (ref 0.3–1.2)
Total Protein: 5.6 g/dL — ABNORMAL LOW (ref 6.5–8.1)

## 2018-12-14 LAB — C-REACTIVE PROTEIN: CRP: 11.1 mg/dL — ABNORMAL HIGH (ref <1.0)

## 2018-12-14 LAB — D-DIMER, QUANTITATIVE: D-Dimer, Quant: 0.84 ug/mL-FEU — ABNORMAL HIGH (ref 0.00–0.50)

## 2018-12-14 LAB — PROCALCITONIN: Procalcitonin: 0.77 ng/mL

## 2018-12-14 LAB — FERRITIN: Ferritin: 138 ng/mL (ref 24–336)

## 2018-12-14 MED ORDER — POTASSIUM CHLORIDE CRYS ER 20 MEQ PO TBCR
40.0000 meq | EXTENDED_RELEASE_TABLET | Freq: Once | ORAL | Status: AC
  Administered 2018-12-14: 09:00:00 40 meq via ORAL
  Filled 2018-12-14: qty 2

## 2018-12-14 MED ORDER — SODIUM CHLORIDE 0.9 % IV SOLN
2.0000 g | INTRAVENOUS | Status: AC
  Administered 2018-12-15 – 2018-12-17 (×3): 2 g via INTRAVENOUS
  Filled 2018-12-14 (×3): qty 20

## 2018-12-14 MED ORDER — DILTIAZEM HCL ER COATED BEADS 120 MG PO CP24
240.0000 mg | ORAL_CAPSULE | Freq: Every day | ORAL | Status: DC
  Administered 2018-12-15 – 2018-12-16 (×2): 240 mg via ORAL
  Filled 2018-12-14 (×2): qty 2

## 2018-12-14 MED ORDER — METRONIDAZOLE 500 MG PO TABS
500.0000 mg | ORAL_TABLET | Freq: Three times a day (TID) | ORAL | Status: AC
  Administered 2018-12-14 – 2018-12-18 (×12): 500 mg via ORAL
  Filled 2018-12-14 (×13): qty 1

## 2018-12-14 MED ORDER — SODIUM CHLORIDE 0.9 % IV SOLN: 1.0000 g | INTRAVENOUS | Status: DC

## 2018-12-14 NOTE — Progress Notes (Signed)
Pt is resistant to changing positions in bed, lays flat on his back all the time, with my attempts at positioning him on his side , he is back on his back within 5 mins.

## 2018-12-14 NOTE — Progress Notes (Signed)
Pt has completed 5d of ceftriaxone and azith for his PNA. His initial PCT was elevated. CXR showed worsening. Unasyn was ordered but d/w Dr. Sloan Leiter to add on Flagyl instead for and extend the coverage for another 3d.   Resume ceftriaxone 2g IV qday x3 Add flagyl 500mg  PO TID x3d  Onnie Boer, PharmD, BCIDP, AAHIVP, CPP Infectious Disease Pharmacist 12/14/2018 1:58 PM

## 2018-12-14 NOTE — Plan of Care (Addendum)
Called Stanton Kidney patient's wife for update. All questions answered  13:00 patient noted to have elevated temp of 102.7 pt given tylenol and packed in ice. MEWS score elevated to red. Patient had some choking when taking tylenol. Notified provider of these changes. Order obtained to consult speech therapy for swallow eval. Will continue to monitor temp and update MD.

## 2018-12-14 NOTE — Plan of Care (Signed)
POC reviewed

## 2018-12-14 NOTE — Progress Notes (Signed)
Took over care of patient at 1600

## 2018-12-14 NOTE — Progress Notes (Addendum)
PROGRESS NOTE                                                                                                                                                                                                             Patient Demographics:    Derrick Bender, is a 73 y.o. male, DOB - Aug 23, 1945, [id number]  Outpatient Primary MD for the patient is Glenda Chroman, MD   Admit date - 12/09/2018   LOS - 5  No chief complaint on file.      Brief Narrative: Patient is a 73 y.o. male with PMHx of-not on home O2, DM-2-(diet controlled at home), HTN, CVA, seizure disorder, prostate cancer s/p total prostatectomy, OSA on CPAP-who was transferred from [facility] after he was found to have persistent fever, acute hypoxemic respiratory failure in the setting of COVID-19 pneumonia.  His daughter was recently tested positive for COVID-19.   Subjective:   Developed fever last night-sleeping when I walked in-but denies any chest pain, shortness of breath.   Assessment  & Plan :   Acute Hypoxic Resp Failure due to Covid 19 Viral pneumonia with concomitant bacterial pneumonia: Overall improved-however had fever yesterday.  Recheck procalcitonin-we will extend Rocephin for 2 additional days.  Will complete Remdesivir today.  Continue prednisone.  Fever: afebrile this morning-however febrile last night.  O2 requirements: On 2 L of oxygen (gets placed on nocturnal O2 as has OSA and unable to use CPAP)   COVID-19 Labs: Recent Labs    12/12/18 0330 12/12/18 0335 12/13/18 0134 12/14/18 0300  DDIMER  --  0.76* 0.59* 0.84*  FERRITIN 319  --  227 138  CRP 8.8*  --  3.6* 11.1*    Lab Results  Component Value Date   SARSCOV2NAA NOT DETECTED 10/14/2018   [place] Not Detected 09/30/2018     COVID-19 Medications: Steroids: 8/25>> Remdesivir: 8/25>> Actemra: Not indicated-given concomitant bacterial pneumonia and elevated  procalcitonin levels Convalescent Plasma:N/A Research Studies:N/A  Other medications: Diuretics:Euvolemic-no need for lasix Current Antibiotics: Rocephin 8/25>>  Prone/Incentive Spirometry: encouraged patient to lie prone for 3-4 hours at a time for a total of 16 hours a day, and to encourage incentive spirometry use 3-4/hour.  DVT Prophylaxis  :  Lovenox   Addendum: Febrile again earlier today-nurse concerned about aspiration-add Flagyl-repeat cultures.  SLP evaluation ordered  Transaminitis: Likely  secondary to COVID-19-downtrending-follow LFTs periodically  AKI: Mild-likely hemodynamically mediated-resolved-follow  COPD: No evidence of exacerbation-continue bronchodilators.  DM-2 (A1c 6.5): Diet controlled at home-CBGs stable with SSI  HTN: BP slightly elevated-increase Cardizem to 240 mg, continue lisinopril.   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  OSA: Claims uses CPAP at night-currently due to our facility's policy of no CPAP-we will be on home oxygen.    Obesity: Estimated body mass index is 32.23 kg/m as calculated from the following:   Height as of this encounter: 5\' 9"  (1.753 m).   Weight as of this encounter: 99 kg.   ABG: No results found for: PHART, PCO2ART, PO2ART, HCO3, TCO2, ACIDBASEDEF, O2SAT  Vent Settings: N/A   Condition - Stable  Family Communication  :  Spouse over the phone on 8/29  Code Status :  Full Code  Diet :  Diet Order            Diet heart healthy/carb modified Room service appropriate? Yes; Fluid consistency: Thin  Diet effective now               Disposition Plan  :   Remain hospitalized-home in next few days-depending on clinical course  Consults  :  None  Procedures  :  None  Antibiotics  :    Anti-infectives (From admission, onward)   Start     Dose/Rate Route Frequency Ordered Stop   12/11/18 1000  remdesivir 100 mg in sodium chloride 0.9 % 250 mL IVPB     100 mg 500 mL/hr over 30 Minutes Intravenous Every 24 hours 12/10/18 0810 12/14/18 1043   12/10/18 1000  cefTRIAXone (ROCEPHIN) 2 g in sodium chloride 0.9 % 100 mL IVPB    Note to Pharmacy: [facility] listed Keflex as an allergy, but it is not listed as significant in our system. The patient was on Levofloxacin and azithromycin at sending facility.   2 g 200 mL/hr over 30 Minutes Intravenous Every 24 hours 12/10/18 0548 12/14/18 1003   12/10/18 1000  azithromycin (ZITHROMAX) tablet 500 mg     500 mg Oral Daily 12/10/18 0548 12/14/18 0924   12/10/18 1000  remdesivir 200 mg in sodium chloride 0.9 % 250 mL IVPB     200 mg 500 mL/hr over 30 Minutes Intravenous Once 12/10/18 0810 12/10/18 1223      Inpatient Medications  Scheduled Meds: . atorvastatin  10 mg Oral QHS  . benzonatate  200 mg Oral TID  . clopidogrel  75 mg Oral Daily  . diltiazem  180 mg Oral Daily  . enoxaparin (LOVENOX) injection  40 mg Subcutaneous Q24H  . escitalopram  20 mg Oral QHS  . insulin aspart  0-9 Units Subcutaneous TID WC  . Ipratropium-Albuterol  1 puff Inhalation Q6H  . lisinopril  20 mg Oral Daily  . mometasone-formoterol  2 puff Inhalation BID  . oxybutynin  5 mg Oral TID  . pantoprazole  40 mg Oral Daily  . phenytoin  100 mg Oral Q8H  . predniSONE  40 mg Oral Q breakfast  . sodium chloride flush  10-40 mL Intracatheter Q12H  . topiramate  100 mg Oral BID   Continuous Infusions:  PRN Meds:.acetaminophen, albuterol, chlorpheniramine-HYDROcodone, diazepam, polyethylene  glycol, prochlorperazine, sodium chloride flush   Time Spent in minutes  25  See all Orders from today for further details   Oren Binet M.D on 12/14/2018 at 11:59 AM  To page go to [email or web address] - use universal password  [facility] -  Office  [phone]    Objective:   Vitals:   12/14/18 0500 12/14/18 0600 12/14/18 0649 12/14/18 0800  BP:   (!) 164/84 (!) 156/74  Pulse: 74 76  88  Resp: (!) 22 (!) 32    Temp:  99 F (37.2 C)  99.5 F (37.5 C)  TempSrc:    Oral  SpO2: 98% (!) 85%  95%  Weight:      Height:        Wt Readings from Last 3 Encounters:  12/10/18 99 kg  11/16/15 100.2 kg     Intake/Output Summary (Last 24 hours) at 12/14/2018 1159 Last data filed at 12/13/2018 2100 Gross per 24 hour  Intake 120 ml  Output -  Net 120 ml     Physical Exam Gen Exam:Alert awake-not in any distress HEENT:atraumatic, normocephalic Chest: B/L clear to auscultation anteriorly CVS:S1S2 regular Abdomen:soft non tender, non distended Extremities:no edema Neurology: Non focal Skin: no rash   Data Review:    CBC Recent Labs  Lab 12/10/18 0600 12/11/18 0350 12/12/18 0335 12/13/18 0134 12/14/18 0300  WBC 4.5 4.3 5.9 10.2 9.7  HGB 12.1* 11.2* 10.8* 10.8* 10.9*  HCT 38.3* 35.0* 34.4* 34.0* 34.6*  PLT 153 147* 172 223 261  MCV 88.2 88.4 88.2 88.8 89.4  MCH 27.9 28.3 27.7 28.2 28.2  MCHC 31.6 32.0 31.4 31.8 31.5  RDW 14.8 15.0 15.0 14.9 15.3  LYMPHSABS 1.1 0.5* 0.4* 1.0 1.0  MONOABS 0.5 0.7 0.5 0.8 0.8  EOSABS 0.0 0.0 0.0 0.0 0.0  BASOSABS 0.0 0.0 0.0 0.0 0.0    Chemistries  Recent Labs  Lab 12/10/18 0600 12/11/18 0350 12/12/18 0335 12/13/18 0134 12/14/18 0300  NA 139 140 141 143 144  K 3.6 3.5 3.4* 3.3* 3.3*  CL 108 110 109 110 110  CO2 22 22 20* 25 25  GLUCOSE 111* 197* 155* 147* 163*  BUN 28* 25* 26* 31* 30*  CREATININE 1.26* 1.20 1.12 1.20 1.31*  CALCIUM 8.0* 7.7* 8.0* 8.0* 8.2*  MG 1.9 2.3  --   --   --   AST 109* 72* 61* 51* 32  ALT 57* 47* 44 44 37  ALKPHOS 91 78 75 76 81  BILITOT 0.6 0.6 0.3 0.4 0.2*    ------------------------------------------------------------------------------------------------------------------ No results for input(s): CHOL, HDL, LDLCALC, TRIG, CHOLHDL, LDLDIRECT in the last 72 hours.  Lab Results  Component Value Date   HGBA1C 6.5 (H) 12/10/2018   ------------------------------------------------------------------------------------------------------------------ No results for input(s): TSH, T4TOTAL, T3FREE, THYROIDAB in the last 72 hours.  Invalid input(s): FREET3 ------------------------------------------------------------------------------------------------------------------ Recent Labs    12/13/18 0134 12/14/18 0300  FERRITIN 227 138    Coagulation profile No results for input(s): INR, PROTIME in the last 168 hours.  Recent Labs    12/13/18 0134 12/14/18 0300  DDIMER 0.59* 0.84*    Cardiac Enzymes No results for input(s): CKMB, TROPONINI, MYOGLOBIN in the last 168 hours.  Invalid input(s): CK ------------------------------------------------------------------------------------------------------------------    Component Value Date/Time   BNP 36.3 12/10/2018 0600    Micro Results No results found for this or any previous visit (from the past 240 hour(s)).  Radiology Reports Dg Chest Port 1 View  Result Date: 12/10/2018 CLINICAL DATA:  Pneumonia due  to COVID-19 EXAM: PORTABLE CHEST 1 VIEW COMPARISON:  Portable exam 0620 hours compared to 12/09/2018 FINDINGS: Enlargement of cardiac silhouette. Tortuous aorta. Pulmonary vascularity normal. BILATERAL pulmonary infiltrates again seen, little changed. No pleural effusion or pneumothorax. Bones unremarkable. IMPRESSION: Persistent pulmonary infiltrates bilaterally. Electronically Signed   By: Lavonia Dana M.D.   On: 12/10/2018 08:59   Dg Chest Port 1v Same Day  Result Date: 12/14/2018 CLINICAL DATA:  Shortness of breath EXAM: PORTABLE CHEST 1 VIEW COMPARISON:  December 10, 2018 FINDINGS: No pneumothorax.  There is opacity diffusely throughout the right lung which is worsened. There is opacity in left base which has worsened. No other interval changes. IMPRESSION: Worsening bilateral pulmonary opacities/infiltrates, diffusely distributed on the right and localize in the left base on the left. This may represent an infectious process. Asymmetric pulmonary edema is considered less likely but possible. Electronically Signed   By: Dorise Bullion III M.D   On: 12/14/2018 10:52

## 2018-12-15 ENCOUNTER — Inpatient Hospital Stay (HOSPITAL_COMMUNITY): Payer: Medicare PPO

## 2018-12-15 DIAGNOSIS — U071 COVID-19: Principal | ICD-10-CM

## 2018-12-15 DIAGNOSIS — J1289 Other viral pneumonia: Secondary | ICD-10-CM

## 2018-12-15 LAB — CBC WITH DIFFERENTIAL/PLATELET
Abs Immature Granulocytes: 0.25 10*3/uL — ABNORMAL HIGH (ref 0.00–0.07)
Basophils Absolute: 0 10*3/uL (ref 0.0–0.1)
Basophils Relative: 0 %
Eosinophils Absolute: 0 10*3/uL (ref 0.0–0.5)
Eosinophils Relative: 0 %
HCT: 34 % — ABNORMAL LOW (ref 39.0–52.0)
Hemoglobin: 10.6 g/dL — ABNORMAL LOW (ref 13.0–17.0)
Immature Granulocytes: 2 %
Lymphocytes Relative: 7 %
Lymphs Abs: 0.8 10*3/uL (ref 0.7–4.0)
MCH: 27.9 pg (ref 26.0–34.0)
MCHC: 31.2 g/dL (ref 30.0–36.0)
MCV: 89.5 fL (ref 80.0–100.0)
Monocytes Absolute: 0.8 10*3/uL (ref 0.1–1.0)
Monocytes Relative: 7 %
Neutro Abs: 9.1 10*3/uL — ABNORMAL HIGH (ref 1.7–7.7)
Neutrophils Relative %: 84 %
Platelets: 281 10*3/uL (ref 150–400)
RBC: 3.8 MIL/uL — ABNORMAL LOW (ref 4.22–5.81)
RDW: 15.2 % (ref 11.5–15.5)
WBC: 11 10*3/uL — ABNORMAL HIGH (ref 4.0–10.5)
nRBC: 0.8 % — ABNORMAL HIGH (ref 0.0–0.2)

## 2018-12-15 LAB — C-REACTIVE PROTEIN: CRP: 17.1 mg/dL — ABNORMAL HIGH (ref <1.0)

## 2018-12-15 LAB — COMPREHENSIVE METABOLIC PANEL
ALT: 29 U/L (ref 0–44)
AST: 22 U/L (ref 15–41)
Albumin: 2.3 g/dL — ABNORMAL LOW (ref 3.5–5.0)
Alkaline Phosphatase: 72 U/L (ref 38–126)
Anion gap: 7 (ref 5–15)
BUN: 27 mg/dL — ABNORMAL HIGH (ref 8–23)
CO2: 25 mmol/L (ref 22–32)
Calcium: 8.3 mg/dL — ABNORMAL LOW (ref 8.9–10.3)
Chloride: 112 mmol/L — ABNORMAL HIGH (ref 98–111)
Creatinine, Ser: 1.16 mg/dL (ref 0.61–1.24)
GFR calc Af Amer: >60 mL/min (ref >60)
GFR calc non Af Amer: >60 mL/min (ref >60)
Glucose, Bld: 143 mg/dL — ABNORMAL HIGH (ref 70–99)
Potassium: 3.8 mmol/L (ref 3.5–5.1)
Sodium: 144 mmol/L (ref 135–145)
Total Bilirubin: 0.7 mg/dL (ref 0.3–1.2)
Total Protein: 5.7 g/dL — ABNORMAL LOW (ref 6.5–8.1)

## 2018-12-15 LAB — PROCALCITONIN: Procalcitonin: 0.5 ng/mL

## 2018-12-15 LAB — GLUCOSE, CAPILLARY
Glucose-Capillary: 106 mg/dL — ABNORMAL HIGH (ref 70–99)
Glucose-Capillary: 178 mg/dL — ABNORMAL HIGH (ref 70–99)
Glucose-Capillary: 273 mg/dL — ABNORMAL HIGH (ref 70–99)
Glucose-Capillary: 351 mg/dL — ABNORMAL HIGH (ref 70–99)

## 2018-12-15 LAB — D-DIMER, QUANTITATIVE: D-Dimer, Quant: 1.17 ug/mL-FEU — ABNORMAL HIGH (ref 0.00–0.50)

## 2018-12-15 LAB — BRAIN NATRIURETIC PEPTIDE: B Natriuretic Peptide: 84.9 pg/mL (ref 0.0–100.0)

## 2018-12-15 LAB — FERRITIN: Ferritin: 132 ng/mL (ref 24–336)

## 2018-12-15 MED ORDER — HYDROCHLOROTHIAZIDE 25 MG PO TABS
50.0000 mg | ORAL_TABLET | Freq: Every day | ORAL | Status: DC
  Administered 2018-12-15 – 2018-12-16 (×2): 50 mg via ORAL
  Filled 2018-12-15: qty 2

## 2018-12-15 MED ORDER — HYDRALAZINE HCL 20 MG/ML IJ SOLN: 10.0000 mg | Freq: Four times a day (QID) | INTRAMUSCULAR | Status: DC | PRN

## 2018-12-15 NOTE — Progress Notes (Addendum)
PROGRESS NOTE                                                                                                                                                                                                             Patient Demographics:    Derrick Bender, is a 73 y.o. male, DOB - 06/29/1945, SUO:[id number]  Outpatient Primary MD for the patient is Glenda Chroman, MD   Admit date - 12/09/2018   LOS - 6  No chief complaint on file.      Brief Narrative: Patient is a 73 y.o. male with PMHx of-not on home O2, DM-2-(diet controlled at home), HTN, CVA, seizure disorder, prostate cancer s/p total prostatectomy, OSA on CPAP-who was transferred from [facility] after he was found to have persistent fever, acute hypoxemic respiratory failure in the setting of COVID-19 pneumonia.  His daughter was recently tested positive for COVID-19.   Subjective:   Patient in bed, appears comfortable, denies any headache, no fever, no chest pain or pressure, no shortness of breath , no abdominal pain. No focal weakness.    Assessment  & Plan :   Acute Hypoxic Resp Failure due to Covid 19 Viral pneumonia with concomitant bacterial pneumonia:   He had developed severe hypoxic respiratory failure and was started on IV steroids and Remdisvir combination, he did not receive Actemra as his procalcitonin levels were elevated.  He is still on 2-4 L of nasal cannula oxygen, unfortunately he has started to run fevers since the evening of 12/14/2018.  There was a suspicion of possible overlying aspiration bacterial pneumonia hence he was started on IV Rocephin and Flagyl combination on 12/14/2018.  Speech to evaluate.  Will place him on a soft and nectar thick diet.  Monitor clinically closely.  Continue oral steroids.  CRP is gradually rising.  Will keep a close watch.   COVID-19 Labs: Recent Labs    12/13/18 0134 12/14/18 0300 12/15/18 0440  DDIMER  0.59* 0.84* 1.17*  FERRITIN 227 138 132  CRP 3.6* 11.1* 17.1*    Lab Results  Component Value Date   SARSCOV2NAA NOT DETECTED 10/14/2018   [place] Not Detected 09/30/2018    Hepatic Function Latest Ref Rng & Units 12/15/2018 12/14/2018 12/13/2018  Total Protein 6.5 - 8.1 g/dL 5.7(L) 5.6(L) 5.8(L)  Albumin 3.5 - 5.0 g/dL 2.3(L) 2.3(L) 2.5(L)  AST 15 - 41 U/L 22 32 51(H)  ALT 0 - 44 U/L 29 37 44  Alk Phosphatase 38 - 126 U/L 72 81 76  Total Bilirubin 0.3 - 1.2 mg/dL 0.7 0.2(L) 0.4    COVID-19 Medications:  Steroids: 8/25>> Remdesivir: 8/25>> Actemra: Not indicated-given concomitant bacterial pneumonia and elevated procalcitonin levels Convalescent Plasma:N/A Research Studies:N/A     Fevers.  See #1 above.  UA and bladder scan also ordered on 12/15/2018.  Transaminitis: COVID-19 related.  Has resolved.  OSA - CPAP QHS at home, continue nasal cannula oxygen here.  AKI: Mild-likely hemodynamically mediated-resolved-follow  COPD: No evidence of exacerbation-continue bronchodilators.  DM-2 (A1c 6.5): Diet controlled at home-CBGs stable with SSI  HTN: In poor control on combination of Cardizem and lisinopril.  Will add hydralazine for better control both scheduled and as needed.  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  OSA: Claims uses CPAP at night-currently due to our facility's policy of no CPAP-we will be on home oxygen.    Obesity: BMI of 32.  Follow with PCP for weight loss.    Condition -guarded  Family Communication  : Updated wife and daughter in detail on 12/15/2018.  Daughter wants to be updated daily not  the wife.  Code Status :  Full Code  Diet :  Diet Order            Diet heart healthy/carb modified Room service appropriate? Yes; Fluid consistency: Thin  Diet effective now               Disposition Plan  :  Remain hospitalized-home in next few days-depending on clinical course  Consults  :  None  Procedures  :  None  DVT Prophylaxis  :  Lovenox   Antibiotics  :    Antibiotics Given (last 72 hours)    Date/Time Action Medication Dose Rate   12/12/18 1356 New Bag/Given   remdesivir 100 mg in sodium chloride 0.9 % 250 mL IVPB 100 mg 500 mL/hr   12/13/18 0847 Given   azithromycin (ZITHROMAX) tablet 500 mg 500 mg    12/13/18 0945 New Bag/Given   remdesivir 100 mg in sodium chloride 0.9 % 250 mL IVPB 100 mg 500 mL/hr   12/13/18 1042 New Bag/Given   cefTRIAXone (ROCEPHIN) 2 g in sodium chloride 0.9 % 100 mL IVPB 2 g 200 mL/hr   12/14/18 0924 Given   azithromycin (ZITHROMAX) tablet 500 mg 500 mg    12/14/18 0933 New Bag/Given   cefTRIAXone (ROCEPHIN) 2 g in sodium chloride 0.9 % 100 mL IVPB 2 g 200 mL/hr   12/14/18 1013 New Bag/Given   remdesivir 100 mg in sodium chloride 0.9 % 250 mL IVPB 100 mg 500 mL/hr   12/14/18 1445 Given   metroNIDAZOLE (FLAGYL) tablet 500 mg 500 mg    12/14/18 2232 Given   metroNIDAZOLE (FLAGYL) tablet 500 mg 500 mg    12/15/18 1093 Given   metroNIDAZOLE (FLAGYL) tablet 500 mg 500 mg      Inpatient Medications  Scheduled Meds: . atorvastatin  10 mg Oral QHS  . benzonatate  200 mg Oral TID  . clopidogrel  75 mg Oral Daily  . diltiazem  240 mg Oral  Daily  . enoxaparin (LOVENOX) injection  40 mg Subcutaneous Q24H  . escitalopram  20 mg Oral QHS  . insulin aspart  0-9 Units Subcutaneous TID WC  . Ipratropium-Albuterol  1 puff Inhalation Q6H  . lisinopril  20 mg Oral Daily  . metroNIDAZOLE  500 mg Oral Q8H  . mometasone-formoterol  2 puff Inhalation BID  . oxybutynin  5 mg Oral TID  . pantoprazole  40 mg Oral Daily  . phenytoin  100 mg  Oral Q8H  . predniSONE  40 mg Oral Q breakfast  . topiramate  100 mg Oral BID   Continuous Infusions: . cefTRIAXone (ROCEPHIN)  IV     PRN Meds:.acetaminophen, albuterol, chlorpheniramine-HYDROcodone, diazepam, polyethylene glycol, prochlorperazine   Time Spent in minutes  25  See all Orders from today for further details   Lala Lund M.D on 12/15/2018 at 10:30 AM  To page go to [email or web address] - use universal password  [facility] -  Office  [phone]    Objective:   Vitals:   12/14/18 2000 12/15/18 0000 12/15/18 0423 12/15/18 0823  BP:  (!) 149/74 (!) 179/81 (!) 183/92  Pulse:  65 69 85  Resp: 20 17 (!) 22 (!) 24  Temp:  99.3 F (37.4 C) 98.4 F (36.9 C) (!) 100.5 F (38.1 C)  TempSrc:  Oral Oral Oral  SpO2:  99% 94% 93%  Weight:      Height:        Wt Readings from Last 3 Encounters:  12/10/18 99 kg  11/16/15 100.2 kg     Intake/Output Summary (Last 24 hours) at 12/15/2018 1030 Last data filed at 12/15/2018 0600 Gross per 24 hour  Intake 760 ml  Output 5 ml  Net 755 ml     Physical Exam  Awake Alert, No new F.N deficits, Normal affect [place].AT,PERRAL Supple Neck,No JVD, No cervical lymphadenopathy appriciated.  Symmetrical Chest wall movement, Good air movement bilaterally, CTAB RRR,No Gallops, Rubs or new Murmurs, No Parasternal Heave +ve B.Sounds, Abd Soft, No tenderness, No organomegaly appriciated, No rebound - guarding or rigidity. No Cyanosis, Clubbing or edema, No new Rash or bruise    Data Review:    CBC Recent Labs  Lab 12/11/18 0350 12/12/18 0335 12/13/18 0134 12/14/18 0300 12/15/18 0440  WBC 4.3 5.9 10.2 9.7 11.0*  HGB 11.2* 10.8* 10.8* 10.9* 10.6*  HCT 35.0* 34.4* 34.0* 34.6* 34.0*  PLT 147* 172 223 261 281  MCV 88.4 88.2 88.8 89.4 89.5  MCH 28.3 27.7 28.2 28.2 27.9  MCHC 32.0 31.4 31.8 31.5 31.2  RDW 15.0 15.0 14.9 15.3 15.2  LYMPHSABS 0.5* 0.4* 1.0 1.0 0.8  MONOABS 0.7 0.5 0.8 0.8 0.8  EOSABS 0.0 0.0 0.0 0.0  0.0  BASOSABS 0.0 0.0 0.0 0.0 0.0    Chemistries  Recent Labs  Lab 12/10/18 0600 12/11/18 0350 12/12/18 0335 12/13/18 0134 12/14/18 0300 12/15/18 0440  NA 139 140 141 143 144 144  K 3.6 3.5 3.4* 3.3* 3.3* 3.8  CL 108 110 109 110 110 112*  CO2 22 22 20* _0 GLUCOSE 111* 197* 155* 147* 163* 143*  BUN 28* 25* 26* 31* 30* 27*  CREATININE 1.26* 1.20 1.12 1.20 1.31* 1.16  CALCIUM 8.0* 7.7* 8.0* 8.0* 8.2* 8.3*  MG 1.9 2.3  --   --   --   --   AST 109* 72* 61* 51* 32 22  ALT 57* 47* 44 44 37 29  ALKPHOS 91 78 75 76 81 72  BILITOT 0.6 0.6 0.3 0.4 0.2* 0.7   ------------------------------------------------------------------------------------------------------------------ No results for input(s): CHOL, HDL, LDLCALC, TRIG, CHOLHDL, LDLDIRECT in the last 72 hours.  Lab Results  Component Value Date   HGBA1C 6.5 (H) 12/10/2018   ------------------------------------------------------------------------------------------------------------------ No results for input(s): TSH, T4TOTAL, T3FREE, THYROIDAB in the last 72 hours.  Invalid input(s): FREET3 ------------------------------------------------------------------------------------------------------------------ Recent Labs    12/14/18 0300 12/15/18 0440  FERRITIN 138 132    Coagulation profile No results for input(s): INR, PROTIME in the last 168 hours.  Recent Labs    12/14/18 0300 12/15/18 0440  DDIMER 0.84* 1.17*    Cardiac Enzymes No results for input(s): CKMB, TROPONINI, MYOGLOBIN in the last 168 hours.  Invalid input(s): CK ------------------------------------------------------------------------------------------------------------------    Component Value Date/Time   BNP 36.3 12/10/2018 0600    Micro Results Recent Results (from the past 240 hour(s))  Culture, blood (routine x 2)     Status: None (Preliminary result)   Collection Time: 12/14/18  4:30 PM   Specimen: Left Antecubital; Blood  Result Value  Ref Range Status   Specimen Description   Final    LEFT ANTECUBITAL Performed at [facility] [address] [place], [place] [address]    Special Requests   Final    BOTTLES DRAWN AEROBIC ONLY Blood Culture adequate volume Performed at [facility] [address], [place], [place] [address]    Culture   Final    NO GROWTH < 24 HOURS Performed at [facility] [address], [place], [place] [address]    Report Status PENDING  Incomplete  Culture, blood (routine x 2)     Status: None (Preliminary result)   Collection Time: 12/14/18  4:32 PM   Specimen: BLOOD LEFT HAND  Result Value Ref Range Status   Specimen Description   Final    BLOOD LEFT HAND Performed at [facility] [address] [place], [place] [address]    Special Requests   Final    BOTTLES DRAWN AEROBIC ONLY Blood Culture adequate volume Performed at [facility] [address], [place], [place] [address]    Culture   Final    NO GROWTH < 24 HOURS Performed at [facility] [address], [place], [place] [address]    Report Status PENDING  Incomplete    Radiology Reports Dg Chest Port 1 View  Result Date: 12/15/2018 CLINICAL DATA:  Dyspnea EXAM: PORTABLE CHEST 1 VIEW COMPARISON:  Chest radiograph from one day prior. FINDINGS: Stable cardiomediastinal silhouette with normal heart size. No pneumothorax. No pleural effusion. Patchy opacities throughout both lungs, with relative sparing of the upper left lung, not substantially changed. IMPRESSION: No substantial change in extensive patchy bilateral lung opacities compatible with multilobar pneumonia. Electronically Signed   By: Ilona Sorrel M.D.   On: 12/15/2018 10:11   Dg Chest Port 1 View  Result Date: 12/10/2018 CLINICAL DATA:  Pneumonia due to COVID-19 EXAM: PORTABLE CHEST 1 VIEW COMPARISON:  Portable exam 0620 hours compared to 12/09/2018 FINDINGS: Enlargement of  cardiac silhouette. Tortuous aorta. Pulmonary vascularity normal. BILATERAL pulmonary infiltrates again seen, little changed. No pleural effusion or pneumothorax. Bones unremarkable. IMPRESSION: Persistent pulmonary infiltrates bilaterally. Electronically Signed   By: Lavonia Dana M.D.   On: 12/10/2018 08:59   Dg Chest Port 1v Same Day  Result Date: 12/14/2018 CLINICAL DATA:  Shortness of breath EXAM: PORTABLE CHEST 1 VIEW COMPARISON:  December 10, 2018 FINDINGS: No pneumothorax. There  is opacity diffusely throughout the right lung which is worsened. There is opacity in left base which has worsened. No other interval changes. IMPRESSION: Worsening bilateral pulmonary opacities/infiltrates, diffusely distributed on the right and localize in the left base on the left. This may represent an infectious process. Asymmetric pulmonary edema is considered less likely but possible. Electronically Signed   By: Dorise Bullion III M.D   On: 12/14/2018 10:52

## 2018-12-15 NOTE — Progress Notes (Signed)
PT Cancellation Note  Patient Details Name: Derrick Bender MRN: [id number] DOB: 24-Oct-1945   Cancelled Treatment:    Reason Eval/Treat Not Completed: Fatigue/lethargy limiting ability to participate , pt in bed, HR 121, Sats 91 5 on RA, very somnolent. RN reports that patient was up  For breakfast, bathed and pt. Recently got himself back into bed. Check back tomorrow.  Claretha Cooper 12/15/2018, 1:22 PM

## 2018-12-15 NOTE — Plan of Care (Addendum)
Spoke with patient's wife Stanton Kidney who stated patient "needed to come home because of hospital bills" explained to patient's wife that it was important for the patient to be well enough before being discharged but that we could inquire about financial assistance for this admission. MD states he will speak to wife and daughter who are both calling the patient and telling him he needs to come home. This is causing the patient anxiety. PRN medications given. Patient encouraged to focus on getting well and that we would speak more with his wife and consult financial planning. Patient agreeable. Patient then sat up in the chair for breakfast and had BM on bedside commode.   10:45 Patient back in bed.   14:35 patient has been up to the bedside commode multiple times to have BMs each time patient has had an incontinent episode before and after. Attempted to catch urine with clean urinal which was unsuccessful. Tried to place condom cath which was unscuccessful. Patient's anatomy is not conducive to obtain urine samples in this manner. Updated Dr Candiss Norse. Order obtained for straight cath to collect UA. Unable to straight cath because of patient's history of prostate ca. Patient states urologist has had to place catheter "'in surgery before." Updated Dr Candiss Norse regarding this information and inability to collect urine sample at this time. Will continue to try to collect urine sample.

## 2018-12-16 LAB — D-DIMER, QUANTITATIVE: D-Dimer, Quant: 2.12 ug/mL-FEU — ABNORMAL HIGH (ref 0.00–0.50)

## 2018-12-16 LAB — LACTATE DEHYDROGENASE: LDH: 452 U/L — ABNORMAL HIGH (ref 98–192)

## 2018-12-16 LAB — CBC WITH DIFFERENTIAL/PLATELET
Abs Immature Granulocytes: 0.34 10*3/uL — ABNORMAL HIGH (ref 0.00–0.07)
Basophils Absolute: 0 10*3/uL (ref 0.0–0.1)
Basophils Relative: 0 %
Eosinophils Absolute: 0.1 10*3/uL (ref 0.0–0.5)
Eosinophils Relative: 1 %
HCT: 39.4 % (ref 39.0–52.0)
Hemoglobin: 12.4 g/dL — ABNORMAL LOW (ref 13.0–17.0)
Immature Granulocytes: 3 %
Lymphocytes Relative: 9 %
Lymphs Abs: 1.1 10*3/uL (ref 0.7–4.0)
MCH: 28.1 pg (ref 26.0–34.0)
MCHC: 31.5 g/dL (ref 30.0–36.0)
MCV: 89.1 fL (ref 80.0–100.0)
Monocytes Absolute: 1.2 10*3/uL — ABNORMAL HIGH (ref 0.1–1.0)
Monocytes Relative: 10 %
Neutro Abs: 9.4 10*3/uL — ABNORMAL HIGH (ref 1.7–7.7)
Neutrophils Relative %: 77 %
Platelets: 420 10*3/uL — ABNORMAL HIGH (ref 150–400)
RBC: 4.42 MIL/uL (ref 4.22–5.81)
RDW: 15.3 % (ref 11.5–15.5)
WBC: 12.2 10*3/uL — ABNORMAL HIGH (ref 4.0–10.5)
nRBC: 1.1 % — ABNORMAL HIGH (ref 0.0–0.2)

## 2018-12-16 LAB — COMPREHENSIVE METABOLIC PANEL
ALT: 27 U/L (ref 0–44)
AST: 25 U/L (ref 15–41)
Albumin: 2.6 g/dL — ABNORMAL LOW (ref 3.5–5.0)
Alkaline Phosphatase: 86 U/L (ref 38–126)
Anion gap: 12 (ref 5–15)
BUN: 21 mg/dL (ref 8–23)
CO2: 26 mmol/L (ref 22–32)
Calcium: 8.8 mg/dL — ABNORMAL LOW (ref 8.9–10.3)
Chloride: 104 mmol/L (ref 98–111)
Creatinine, Ser: 1.28 mg/dL — ABNORMAL HIGH (ref 0.61–1.24)
GFR calc Af Amer: >60 mL/min (ref >60)
GFR calc non Af Amer: 55 mL/min — ABNORMAL LOW (ref >60)
Glucose, Bld: 122 mg/dL — ABNORMAL HIGH (ref 70–99)
Potassium: 3 mmol/L — ABNORMAL LOW (ref 3.5–5.1)
Sodium: 142 mmol/L (ref 135–145)
Total Bilirubin: 0.4 mg/dL (ref 0.3–1.2)
Total Protein: 6.5 g/dL (ref 6.5–8.1)

## 2018-12-16 LAB — GLUCOSE, CAPILLARY
Glucose-Capillary: 318 mg/dL — ABNORMAL HIGH (ref 70–99)
Glucose-Capillary: 116 mg/dL — ABNORMAL HIGH (ref 70–99)
Glucose-Capillary: 116 mg/dL — ABNORMAL HIGH (ref 70–99)
Glucose-Capillary: 179 mg/dL — ABNORMAL HIGH (ref 70–99)
Glucose-Capillary: 346 mg/dL — ABNORMAL HIGH (ref 70–99)
Glucose-Capillary: 300 mg/dL — ABNORMAL HIGH (ref 70–99)

## 2018-12-16 LAB — C-REACTIVE PROTEIN: CRP: 15.2 mg/dL — ABNORMAL HIGH (ref <1.0)

## 2018-12-16 LAB — MAGNESIUM: Magnesium: 1.9 mg/dL (ref 1.7–2.4)

## 2018-12-16 LAB — PROCALCITONIN: Procalcitonin: 0.46 ng/mL

## 2018-12-16 LAB — FERRITIN: Ferritin: 115 ng/mL (ref 24–336)

## 2018-12-16 LAB — BRAIN NATRIURETIC PEPTIDE: B Natriuretic Peptide: 93.7 pg/mL (ref 0.0–100.0)

## 2018-12-16 MED ORDER — POTASSIUM CHLORIDE 20 MEQ PO PACK
40.0000 meq | PACK | Freq: Once | ORAL | Status: AC
  Administered 2018-12-16: 40 meq via ORAL
  Filled 2018-12-16: qty 2

## 2018-12-16 MED ORDER — SODIUM CHLORIDE 0.9% FLUSH: 10.0000 mL | INTRAVENOUS | Status: DC | PRN

## 2018-12-16 MED ORDER — SODIUM CHLORIDE 0.9% FLUSH
10.0000 mL | Freq: Two times a day (BID) | INTRAVENOUS | Status: DC
  Administered 2018-12-16 – 2018-12-18 (×4): 10 mL

## 2018-12-16 MED ORDER — POTASSIUM CHLORIDE 10 MEQ/100ML IV SOLN
10.0000 meq | INTRAVENOUS | Status: AC
  Administered 2018-12-16 (×4): 10 meq via INTRAVENOUS
  Filled 2018-12-16 (×5): qty 100

## 2018-12-16 NOTE — Progress Notes (Signed)
Physical Therapy Treatment Patient Details Name: Derrick Bender MRN: [id number] DOB: [birth year] Today's Date: 12/16/2018    History of Present Illness Patient is a 73 y.o. male with PMHx including, DM-2, HTN, CVA, seizure disorder, prostate cancer s/p total prostatectomy. Presented to ED with persistent fever, acute hypoxemic respiratory failure (not typically on home O2) in the setting of COVID-19 pneumonia.    PT Comments    The patient found to be very confused, thought his waife was to be discharged and  And then come pick him up. Kept stating that something was all mixed up. Wife called into room and told pt. That she was home. The patient is more confused, on Eval, he was lucent  And talkative in normal conversation on 8/25.    Follow Up Recommendations  Home health PT;Supervision/Assistance - 24 hour     Equipment Recommendations  Rolling walker with 5" wheels;3in1 (PT)    Recommendations for Other Services       Precautions / Restrictions Precautions Precautions: Fall Precaution Comments: pt is confused, needs bed and chair alarms    Mobility  Bed Mobility         Supine to sit: Modified independent (Device/Increase time) Sit to supine: Min guard   General bed mobility comments: Use of bed rail, HOB slightly elevated; increased time and effort  Transfers Overall transfer level: Needs assistance Equipment used: Rolling walker (2 wheeled) Transfers: Sit to/from Stand Sit to Stand: Min guard         General transfer comment: slow to respond to instructions to stand  Ambulation/Gait Ambulation/Gait assistance: Min guard Gait Distance (Feet): 24 Feet Assistive device: Rolling walker (2 wheeled) Gait Pattern/deviations: Step-to pattern;Shuffle;Trunk flexed Gait velocity: Decreased   General Gait Details: patient's gait steady with RW, able to turn around using RW, tends to pick it up   Stairs             Wheelchair Mobility    Modified Rankin  (Stroke Patients Only)       Balance Overall balance assessment: Needs assistance Sitting-balance support: No upper extremity supported Sitting balance-Leahy Scale: Good     Standing balance support: Bilateral upper extremity supported;During functional activity Standing balance-Leahy Scale: Fair Standing balance comment: Can static stand without UE support but unable to accept challenge; reliant on assist for posterior pericare while standing                            Cognition Arousal/Alertness: Awake/alert Behavior During Therapy: Anxious Overall Cognitive Status: No family/caregiver present to determine baseline cognitive functioning Area of Impairment: Attention;Following commands;Awareness;Problem solving                   Current Attention Level: Selective   Following Commands: Follows multi-step commands inconsistently   Awareness: Emergent Problem Solving: Slow processing;Requires verbal cues General Comments: Environmental svs person notified RN that pt was asking for assistance.  This writer went in, pt. on bed with legs hanging off. Patient stating that "everything is all mixed up. My wife is to be discharged and come get me. I don't know what is going on." Multiple times told pt. that he was not going home today. finally, a brother then wife called pt and told him she was home.. pt was less anxious., bed alarm placed      Exercises      General Comments        Pertinent Vitals/Pain Pain Assessment: No/denies pain  Faces Pain Scale: No hurt    Home Living                      Prior Function            PT Goals (current goals can now be found in the care plan section) Progress towards PT goals: Progressing toward goals    Frequency    Min 3X/week      PT Plan Current plan remains appropriate    Co-evaluation              AM-PAC PT "6 Clicks" Mobility   Outcome Measure  Help needed turning from your back to  your side while in a flat bed without using bedrails?: None Help needed moving from lying on your back to sitting on the side of a flat bed without using bedrails?: A Little Help needed moving to and from a bed to a chair (including a wheelchair)?: A Little Help needed standing up from a chair using your arms (e.g., wheelchair or bedside chair)?: A Little Help needed to walk in hospital room?: A Little Help needed climbing 3-5 steps with a railing? : A Lot 6 Click Score: 18    End of Session Equipment Utilized During Treatment: Oxygen Activity Tolerance: Patient limited by fatigue(confused) Patient left: in bed;with call bell/phone within reach;with bed alarm set Nurse Communication: Mobility status PT Visit Diagnosis: Difficulty in walking, not elsewhere classified (R26.2);Unsteadiness on feet (R26.81)     Time: 1000-1015 PT Time Calculation (min) (ACUTE ONLY): 15 min  Charges: 1TA 8-22 mins                     Derrick Bender PT Acute Rehabilitation Services Pager [phone] Office [phone]    Derrick Bender 12/16/2018, 2:16 PM

## 2018-12-16 NOTE — Evaluation (Signed)
Clinical/Bedside Swallow Evaluation Patient Details  Name: Derrick Bender MRN: [id number] Date of Birth: August 11, 1945  Today's Date: 12/16/2018 Time: SLP Start Time (ACUTE ONLY): 81 SLP Stop Time (ACUTE ONLY): 1303 SLP Time Calculation (min) (ACUTE ONLY): 33 min  Past Medical History:  Past Medical History:  Diagnosis Date  . Asthma   . COPD (chronic obstructive pulmonary disease) ([place])   . CVA (cerebral vascular accident) ([place]) 01/2011   [place]  . Depression   . Diabetes ([place])   . Dysarthria   . Hyperlipidemia   . Hypertension   . OSA (obstructive sleep apnea)   . Prostate cancer ([place])    Dr. Exie Parody  . Seizure ([place])   . TIA (transient ischemic attack)    Past Surgical History:  Past Surgical History:  Procedure Laterality Date  . XI ROBOTIC ASSISTED SIMPLE PROSTATECTOMY  03/2013   Dr. Brendia Sacks   HPI:  Patient is a 73 y.o. male with PMHx including, DM-2, HTN, CVA, seizure disorder, prostate cancer s/p total prostatectomy. Presented to ED with persistent fever, acute hypoxemic respiratory failure (not typically on home O2) in the setting of COVID-19 pneumonia. Pt had a choughing episode after swallowing tylenol and SLP swallow evaluation was ordered.   Assessment / Plan / Recommendation Clinical Impression  Pt has no dentition and therefore takes additional time to solids on his soft diet tray, but he has minimal residue remaining after he swallows. A single cough was noted after thin liquids when he took "too much" and tried to swish it in his mouth. Otherwise, even with large sequential boluses, he shows no overt signs of aspiration. He does endorse having a h/o trouble swallowing large pills, saying that he usually has to break them in half. Recommend continuing soft diet but advancing to thin liquids. Meds can be given with thin liquid if small, but would break or place whole in puree if large per pt preference. SLP will f/u briefly for tolerance given h/o risk factors (COPD, CVA)  and current decline in respiratory status.  SLP Visit Diagnosis: Dysphagia, unspecified (R13.10)    Aspiration Risk  Mild aspiration risk    Diet Recommendation Dysphagia 3 (Mech soft);Thin liquid   Liquid Administration via: Cup;Straw Medication Administration: Whole meds with liquid(with puree if large) Supervision: Patient able to self feed;Intermittent supervision to cue for compensatory strategies;Comment(set up assist) Compensations: Slow rate;Small sips/bites Postural Changes: Seated upright at 90 degrees    Other  Recommendations Oral Care Recommendations: Oral care BID   Follow up Recommendations None      Frequency and Duration min 1 x/week  1 week       Prognosis Prognosis for Safe Diet Advancement: Good      Swallow Study   General HPI: Patient is a 73 y.o. male with PMHx including, DM-2, HTN, CVA, seizure disorder, prostate cancer s/p total prostatectomy. Presented to ED with persistent fever, acute hypoxemic respiratory failure (not typically on home O2) in the setting of COVID-19 pneumonia. Pt had a choughing episode after swallowing tylenol and SLP swallow evaluation was ordered. Type of Study: Bedside Swallow Evaluation Previous Swallow Assessment: none in chart Diet Prior to this Study: Dysphagia 3 (soft);Nectar-thick liquids Temperature Spikes Noted: Yes(100.5) Respiratory Status: Nasal cannula History of Recent Intubation: No Behavior/Cognition: Alert;Cooperative;Requires cueing Oral Cavity Assessment: Within Functional Limits Oral Care Completed by SLP: No Oral Cavity - Dentition: Edentulous Vision: Functional for self-feeding Self-Feeding Abilities: Able to feed self;Needs assist Patient Positioning: Upright in bed Baseline Vocal Quality: Normal  Oral/Motor/Sensory Function Overall Oral Motor/Sensory Function: Within functional limits   Ice Chips Ice chips: Not tested   Thin Liquid Thin Liquid: Within functional limits Presentation: Cup;Self Fed     Nectar Thick Nectar Thick Liquid: Not tested   Honey Thick Honey Thick Liquid: Not tested   Puree Puree: Not tested   Solid     Solid: Impaired Presentation: Self Fed;Spoon Oral Phase Impairments: Impaired mastication      Venita Sheffield Tomisha Reppucci 12/16/2018,1:05 PM  Pollyann Glen, M.A. [place] Acute Environmental education officer [phone] Office [phone]

## 2018-12-16 NOTE — Progress Notes (Signed)
Physical Therapy Treatment Patient Details Name: Derrick Bender MRN: [id number] DOB: 01/24/1946 Today's Date: 12/16/2018    History of Present Illness Patient is a 73 y.o. male with PMHx including, DM-2, HTN, CVA, seizure disorder, prostate cancer s/p total prostatectomy. Presented to ED with persistent fever, acute hypoxemic respiratory failure (not typically on home O2) in the setting of COVID-19 pneumonia.    PT Comments    The  First thing patient stated was" I have been bad. I was up by myself when the doctor came in". Reiterated patient to call for assistance. The patient ambulated on  4 L HFNC x 25'. The patient is not as alert and talkative as on Eval 8/25. The patient is requiring more cues for safety.   Follow Up Recommendations  Home health PT;Supervision/Assistance - 24 hour     Equipment Recommendations  Rolling walker with 5" wheels;3in1 (PT)    Recommendations for Other Services       Precautions / Restrictions Precautions Precautions: Fall Precaution Comments: Tachycardia, tachypneic, on HF    Mobility  Bed Mobility         Supine to sit: Modified independent (Device/Increase time)     General bed mobility comments: Use of bed rail, HOB slightly elevated; increased time and effort  Transfers Overall transfer level: Needs assistance Equipment used: Rolling walker (2 wheeled) Transfers: Sit to/from Stand Sit to Stand: Min guard         General transfer comment: slow to respond to instructions to stand  Ambulation/Gait Ambulation/Gait assistance: Min guard Gait Distance (Feet): 24 Feet Assistive device: Rolling walker (2 wheeled) Gait Pattern/deviations: Step-to pattern;Shuffle;Trunk flexed Gait velocity: Decreased   General Gait Details: patient's gait steady with RW, able to turn around using RW, tends to pick it up   Stairs             Wheelchair Mobility    Modified Rankin (Stroke Patients Only)       Balance Overall  balance assessment: Needs assistance Sitting-balance support: No upper extremity supported Sitting balance-Leahy Scale: Good     Standing balance support: Bilateral upper extremity supported;During functional activity Standing balance-Leahy Scale: Fair Standing balance comment: Can static stand without UE support but unable to accept challenge; reliant on assist for posterior pericare while standing                            Cognition Arousal/Alertness: Lethargic   Overall Cognitive Status: No family/caregiver present to determine baseline cognitive functioning Area of Impairment: Attention;Following commands;Awareness;Problem solving                   Current Attention Level: Selective   Following Commands: Follows multi-step commands inconsistently   Awareness: Emergent Problem Solving: Slow processing;Requires verbal cues General Comments: is much slower than first visit      Exercises      General Comments        Pertinent Vitals/Pain Pain Assessment: No/denies pain Faces Pain Scale: No hurt    Home Living                      Prior Function            PT Goals (current goals can now be found in the care plan section) Progress towards PT goals: Progressing toward goals    Frequency    Min 3X/week      PT Plan Current plan remains appropriate  Co-evaluation              AM-PAC PT "6 Clicks" Mobility   Outcome Measure  Help needed turning from your back to your side while in a flat bed without using bedrails?: None Help needed moving from lying on your back to sitting on the side of a flat bed without using bedrails?: A Little Help needed moving to and from a bed to a chair (including a wheelchair)?: A Little Help needed standing up from a chair using your arms (e.g., wheelchair or bedside chair)?: A Little Help needed to walk in hospital room?: A Little Help needed climbing 3-5 steps with a railing? : A Lot 6  Click Score: 18    End of Session   Activity Tolerance: Patient limited by fatigue Patient left: in chair;with call bell/phone within reach;with chair alarm set Nurse Communication: Mobility status PT Visit Diagnosis: Difficulty in walking, not elsewhere classified (R26.2);Unsteadiness on feet (R26.81)     Time: [id number] PT Time Calculation (min) (ACUTE ONLY): 19 min  Charges:  $Gait Training: 8-22 mins                     [place]  Office [phone]    Claretha Cooper 12/16/2018, 2:03 PM

## 2018-12-16 NOTE — Progress Notes (Signed)
Inpatient Diabetes Program Recommendations  AACE/ADA: New Consensus Statement on Inpatient Glycemic Control (2015)  Target Ranges:  Prepandial:   less than 140 mg/dL      Peak postprandial:   less than 180 mg/dL (1-2 hours)      Critically ill patients:  140 - 180 mg/dL   Lab Results  Component Value Date   GLUCAP 116 (H) 12/16/2018   HGBA1C 6.5 (H) 12/10/2018    Review of Glycemic Control Results for Derrick Bender, Derrick Bender (MRN [id number]) as of 12/16/2018 10:09  Ref. Range 12/15/2018 08:24 12/15/2018 11:44 12/15/2018 16:46 12/15/2018 19:16 12/16/2018 07:32  Glucose-Capillary Latest Ref Range: 70 - 99 mg/dL 106 (H) 178 (H) 273 (H) 351 (H) 116 (H)   Diabetes history: DM 2 Outpatient Diabetes medications: Diet Controlled Current orders for Inpatient glycemic control:  Novolog 0-9 units tid  Inpatient Diabetes Program Recommendations:    Patient may benefit from Novolog meal coverage as postprandial glucose trends increase into the 300 range.  Thanks,  Tama Headings RN, MSN, BC-ADM Inpatient Diabetes Coordinator Team Pager [phone] (8a-5p)

## 2018-12-16 NOTE — Progress Notes (Signed)
PROGRESS NOTE                                                                                                                                                                                                             Patient Demographics:    Derrick Bender, is a 73 y.o. male, DOB - December 16, 1945, [id number]  Outpatient Primary MD for the patient is Glenda Chroman, MD   Admit date - 12/09/2018   LOS - 7  No chief complaint on file.      Brief Narrative: Patient is a 73 y.o. male with PMHx of-not on home O2, DM-2-(diet controlled at home), HTN, CVA, seizure disorder, prostate cancer s/p total prostatectomy, OSA on CPAP-who was transferred from [facility] after he was found to have persistent fever, acute hypoxemic respiratory failure in the setting of COVID-19 pneumonia.  His daughter was recently tested positive for COVID-19.   Subjective:   Patient in bed, appears comfortable, denies any headache, no fever, no chest pain or pressure, +ve mild shortness of breath , no abdominal pain. No focal weakness.   Assessment  & Plan :   Acute Hypoxic Resp Failure due to Covid 19 Viral pneumonia with concomitant bacterial pneumonia:   He had developed severe hypoxic respiratory failure and was started on IV steroids and Remdisvir combination, he did not receive Actemra as his procalcitonin levels were elevated.  He is still on 2-4 L of nasal cannula oxygen, unfortunately he has started to run fevers since the evening of 12/14/2018.  There was a suspicion of possible overlying aspiration bacterial pneumonia hence he was started on IV Rocephin and Flagyl combination on 12/14/2018.  Speech to evaluate.  Will place him on a soft and nectar thick diet.  Monitor clinically closely.  Continue oral steroids.  CRP is gradually rising.  Will keep a close watch.   COVID-19 Labs: Recent Labs    12/14/18 0300 12/15/18 0440 12/16/18 0815   DDIMER 0.84* 1.17* 2.12*  FERRITIN 138 132 115  LDH  --   --  452*  CRP 11.1* 17.1* 15.2*    Lab Results  Component Value Date   SARSCOV2NAA NOT DETECTED 10/14/2018   [place] Not Detected 09/30/2018    Hepatic Function Latest Ref Rng & Units 12/16/2018 12/15/2018 12/14/2018  Total Protein 6.5 - 8.1 g/dL 6.5 5.7(L) 5.6(L)  Albumin 3.5 - 5.0 g/dL 2.6(L) 2.3(L) 2.3(L)  AST 15 - 41 U/L 25 22 32  ALT 0 - 44 U/L 27 29 37  Alk Phosphatase 38 - 126 U/L 86 72 81  Total Bilirubin 0.3 - 1.2 mg/dL 0.4 0.7 0.2(L)    COVID-19 Medications:  Steroids: 8/25>> Remdesivir: 8/25>> Actemra: Not indicated-given concomitant bacterial pneumonia and elevated procalcitonin levels Convalescent Plasma:N/A Research Studies:N/A     Fevers.  See #1 above. Bladder scan is stable UA unable to obtain due to continued incontinence.  Staff unable to pass a straight cath.  Transaminitis: COVID-19 related.  Has resolved.  OSA - CPAP QHS at home, continue nasal cannula oxygen here.  AKI: Mild likely hemodynamically mediated, continue to monitor.  COPD: No evidence of exacerbation-continue bronchodilators.  DM-2 (A1c 6.5): Diet controlled at home-CBGs stable with SSI  HTN: In poor control on combination of Cardizem and lisinopril.  Will add hydralazine for better control both scheduled and as needed.  Seizure disorder: Continue Dilantin  History of CVA: No focal deficits-continue Plavix  History of 13 x 10 mm pituitary lesion: Follows with neurosurgery at [facility]  History of prostate cancer: Stable for outpatient follow-up with his outpatient physicians  Debility/deconditioning: Secondary to acute illness-appreciate PT/OT-recommendations are for home health services.  OSA: Claims uses CPAP at night-currently due to our facility's policy of no CPAP-we will be on home oxygen.    Obesity: BMI of 32.  Follow with PCP for weight loss.    Condition -guarded  Family  Communication  : Updated wife and daughter in detail on 12/15/2018.  Daughter wants to be updated daily not the wife. Daughter on 8/31  Code Status :  DNR  Diet :  Diet Order            DIET SOFT Room service appropriate? Yes; Fluid consistency: Nectar Thick  Diet effective now               Disposition Plan  :  Remain hospitalized-home in next few days-depending on clinical course  Consults  :  None  Procedures  :  None  DVT Prophylaxis  :  Lovenox   Antibiotics  :    Antibiotics Given (last 72 hours)    Date/Time Action Medication Dose Rate   12/13/18 1042 New Bag/Given   cefTRIAXone (ROCEPHIN) 2 g in sodium chloride 0.9 % 100 mL IVPB 2 g 200 mL/hr   12/14/18 0924 Given   azithromycin (ZITHROMAX) tablet 500 mg 500 mg    12/14/18 0933 New Bag/Given   cefTRIAXone (ROCEPHIN) 2 g in sodium chloride 0.9 % 100 mL IVPB 2 g 200 mL/hr   12/14/18 1013 New Bag/Given   remdesivir 100 mg in sodium chloride 0.9 % 250 mL IVPB 100 mg 500 mL/hr   12/14/18 1445 Given   metroNIDAZOLE (FLAGYL) tablet 500 mg 500 mg    12/14/18 2232 Given   metroNIDAZOLE (FLAGYL) tablet 500 mg 500 mg    12/15/18 1194 Given   metroNIDAZOLE (FLAGYL) tablet 500 mg 500 mg    12/15/18 1107 New Bag/Given   cefTRIAXone (ROCEPHIN) 2 g in sodium chloride 0.9 % 100 mL IVPB 2 g 200 mL/hr   12/15/18 1430 Given   metroNIDAZOLE (FLAGYL) tablet 500 mg 500 mg    12/15/18 2240 Given   metroNIDAZOLE (FLAGYL) tablet 500 mg 500 mg    12/16/18 0616 Given   metroNIDAZOLE (FLAGYL) tablet 500 mg 500 mg    12/16/18 0840 New Bag/Given  cefTRIAXone (ROCEPHIN) 2 g in sodium chloride 0.9 % 100 mL IVPB 2 g 200 mL/hr     Inpatient Medications  Scheduled Meds: . atorvastatin  10 mg Oral QHS  . benzonatate  200 mg Oral TID  . clopidogrel  75 mg Oral Daily  . diltiazem  240 mg Oral Daily  . enoxaparin (LOVENOX) injection  40 mg Subcutaneous Q24H  . escitalopram  20 mg Oral QHS  . hydrochlorothiazide  50 mg Oral Daily  .  insulin aspart  0-9 Units Subcutaneous TID WC  . Ipratropium-Albuterol  1 puff Inhalation Q6H  . metroNIDAZOLE  500 mg Oral Q8H  . mometasone-formoterol  2 puff Inhalation BID  . oxybutynin  5 mg Oral TID  . pantoprazole  40 mg Oral Daily  . phenytoin  100 mg Oral Q8H  . potassium chloride  40 mEq Oral Once  . predniSONE  40 mg Oral Q breakfast  . topiramate  100 mg Oral BID   Continuous Infusions: . cefTRIAXone (ROCEPHIN)  IV 2 g (12/16/18 0840)  . potassium chloride     PRN Meds:.acetaminophen, albuterol, chlorpheniramine-HYDROcodone, hydrALAZINE, polyethylene glycol, prochlorperazine   Time Spent in minutes  25  See all Orders from today for further details   Lala Lund M.D on 12/16/2018 at 10:20 AM  To page go to [email or web address] - use universal password  [facility] -  Office  [phone]    Objective:   Vitals:   12/15/18 1909 12/15/18 2350 12/16/18 0733 12/16/18 0834  BP: 120/64 135/69 (!) 169/87 139/89  Pulse: 87 80 95   Resp: (!) 24 (!) 23 (!) 40   Temp: (!) 100.4 F (38 C) 99.5 F (37.5 C) 98.8 F (37.1 C)   TempSrc: Oral Oral Oral   SpO2: 94% 92% 92%   Weight:      Height:        Wt Readings from Last 3 Encounters:  12/10/18 99 kg  11/16/15 100.2 kg     Intake/Output Summary (Last 24 hours) at 12/16/2018 1020 Last data filed at 12/16/2018 0600 Gross per 24 hour  Intake 410 ml  Output 300 ml  Net 110 ml     Physical Exam  Awake Alert,  No new F.N deficits, Normal affect [place].AT,PERRAL Supple Neck,No JVD, No cervical lymphadenopathy appriciated.  Symmetrical Chest wall movement, Good air movement bilaterally, coarse bilat b sounds RRR,No Gallops, Rubs or new Murmurs, No Parasternal Heave +ve B.Sounds, Abd Soft, No tenderness, No organomegaly appriciated, No rebound - guarding or rigidity. No Cyanosis, Clubbing or edema, No new Rash or bruise     Data Review:    CBC Recent Labs  Lab 12/12/18 0335 12/13/18 0134 12/14/18 0300  12/15/18 0440 12/16/18 0815  WBC 5.9 10.2 9.7 11.0* 12.2*  HGB 10.8* 10.8* 10.9* 10.6* 12.4*  HCT 34.4* 34.0* 34.6* 34.0* 39.4  PLT 172 223 261 281 420*  MCV 88.2 88.8 89.4 89.5 89.1  MCH 27.7 28.2 28.2 27.9 28.1  MCHC 31.4 31.8 31.5 31.2 31.5  RDW 15.0 14.9 15.3 15.2 15.3  LYMPHSABS 0.4* 1.0 1.0 0.8 1.1  MONOABS 0.5 0.8 0.8 0.8 1.2*  EOSABS 0.0 0.0 0.0 0.0 0.1  BASOSABS 0.0 0.0 0.0 0.0 0.0    Chemistries  Recent Labs  Lab 12/10/18 0600 12/11/18 0350 12/12/18 0335 12/13/18 0134 12/14/18 0300 12/15/18 0440 12/16/18 0815  NA 139 140 141 143 144 144 142  K 3.6 3.5 3.4* 3.3* 3.3* 3.8 3.0*  CL 108 110 109 110 110 112*  104  CO2 22 22 20* _0 GLUCOSE 111* 197* 155* 147* 163* 143* 122*  BUN 28* 25* 26* 31* 30* 27* 21  CREATININE 1.26* 1.20 1.12 1.20 1.31* 1.16 1.28*  CALCIUM 8.0* 7.7* 8.0* 8.0* 8.2* 8.3* 8.8*  MG 1.9 2.3  --   --   --   --  1.9  AST 109* 72* 61* 51* 32 22 25  ALT 57* 47* 44 44 37 29 27  ALKPHOS 91 78 75 76 81 72 86  BILITOT 0.6 0.6 0.3 0.4 0.2* 0.7 0.4   ------------------------------------------------------------------------------------------------------------------ No results for input(s): CHOL, HDL, LDLCALC, TRIG, CHOLHDL, LDLDIRECT in the last 72 hours.  Lab Results  Component Value Date   HGBA1C 6.5 (H) 12/10/2018   ------------------------------------------------------------------------------------------------------------------ No results for input(s): TSH, T4TOTAL, T3FREE, THYROIDAB in the last 72 hours.  Invalid input(s): FREET3 ------------------------------------------------------------------------------------------------------------------ Recent Labs    12/15/18 0440 12/16/18 0815  FERRITIN 132 115    Coagulation profile No results for input(s): INR, PROTIME in the last 168 hours.  Recent Labs    12/15/18 0440 12/16/18 0815  DDIMER 1.17* 2.12*    Cardiac Enzymes No results for input(s): CKMB, TROPONINI, MYOGLOBIN in  the last 168 hours.  Invalid input(s): CK ------------------------------------------------------------------------------------------------------------------    Component Value Date/Time   BNP 93.7 12/16/2018 0815    Micro Results Recent Results (from the past 240 hour(s))  Culture, blood (routine x 2)     Status: None (Preliminary result)   Collection Time: 12/14/18  4:30 PM   Specimen: Left Antecubital; Blood  Result Value Ref Range Status   Specimen Description   Final    LEFT ANTECUBITAL Performed at [facility] [address], [place], [place] [address]    Special Requests   Final    BOTTLES DRAWN AEROBIC ONLY Blood Culture adequate volume Performed at [facility] [address] [place], [place] [address]    Culture   Final    NO GROWTH 2 DAYS Performed at [facility] [address], [place], [place] [address]    Report Status PENDING  Incomplete  Culture, blood (routine x 2)     Status: None (Preliminary result)   Collection Time: 12/14/18  4:32 PM   Specimen: BLOOD LEFT HAND  Result Value Ref Range Status   Specimen Description   Final    BLOOD LEFT HAND Performed at [facility] [address] [place], [place] [address]    Special Requests   Final    BOTTLES DRAWN AEROBIC ONLY Blood Culture adequate volume Performed at [facility] [address], [place], [place] [address]    Culture   Final    NO GROWTH 2 DAYS Performed at [facility] [address], [place], [place] [address]    Report Status PENDING  Incomplete    Radiology Reports Dg Chest Port 1 View  Result Date: 12/15/2018 CLINICAL DATA:  Dyspnea EXAM: PORTABLE CHEST 1 VIEW COMPARISON:  Chest radiograph from one day prior. FINDINGS: Stable cardiomediastinal silhouette with normal heart size. No pneumothorax. No pleural effusion. Patchy opacities throughout both lungs, with relative sparing of the upper  left lung, not substantially changed. IMPRESSION: No substantial change in extensive patchy bilateral lung opacities compatible with multilobar pneumonia. Electronically Signed   By: Ilona Sorrel M.D.   On: 12/15/2018 10:11   Dg Chest Port 1 View  Result Date: 12/10/2018 CLINICAL DATA:  Pneumonia due to  COVID-19 EXAM: PORTABLE CHEST 1 VIEW COMPARISON:  Portable exam 0620 hours compared to 12/09/2018 FINDINGS: Enlargement of cardiac silhouette. Tortuous aorta. Pulmonary vascularity normal. BILATERAL pulmonary infiltrates again seen, little changed. No pleural effusion or pneumothorax. Bones unremarkable. IMPRESSION: Persistent pulmonary infiltrates bilaterally. Electronically Signed   By: Lavonia Dana M.D.   On: 12/10/2018 08:59   Dg Chest Port 1v Same Day  Result Date: 12/14/2018 CLINICAL DATA:  Shortness of breath EXAM: PORTABLE CHEST 1 VIEW COMPARISON:  December 10, 2018 FINDINGS: No pneumothorax. There is opacity diffusely throughout the right lung which is worsened. There is opacity in left base which has worsened. No other interval changes. IMPRESSION: Worsening bilateral pulmonary opacities/infiltrates, diffusely distributed on the right and localize in the left base on the left. This may represent an infectious process. Asymmetric pulmonary edema is considered less likely but possible. Electronically Signed   By: Dorise Bullion III M.D   On: 12/14/2018 10:52

## 2018-12-16 NOTE — Care Management Important Message (Signed)
Important Message  Patient Details  Name: Derrick Bender MRN: [id number] Date of Birth: 07/29/1945   Medicare Important Message Given:  Yes - Important Message mailed due to current National Emergency   Verbal consent obtained due to current National Emergency  Relationship to patient: Spouse/Significant Other Contact Name: Delmos Zahorik Call Date: 12/16/18  Time: 1534 Phone: [phone] Outcome: Spoke with contact Important Message mailed to: Patient address on file      [name] [address] 12/16/2018, 3:36 PM

## 2018-12-16 NOTE — Progress Notes (Signed)
Assessed pt, gave tylenol and IV K+

## 2018-12-17 ENCOUNTER — Inpatient Hospital Stay (HOSPITAL_COMMUNITY): Payer: Medicare PPO

## 2018-12-17 LAB — COMPREHENSIVE METABOLIC PANEL
ALT: 23 U/L (ref 0–44)
AST: 23 U/L (ref 15–41)
Albumin: 2.5 g/dL — ABNORMAL LOW (ref 3.5–5.0)
Alkaline Phosphatase: 75 U/L (ref 38–126)
Anion gap: 12 (ref 5–15)
BUN: 22 mg/dL (ref 8–23)
CO2: 22 mmol/L (ref 22–32)
Calcium: 8.3 mg/dL — ABNORMAL LOW (ref 8.9–10.3)
Chloride: 104 mmol/L (ref 98–111)
Creatinine, Ser: 1.29 mg/dL — ABNORMAL HIGH (ref 0.61–1.24)
GFR calc Af Amer: >60 mL/min (ref >60)
GFR calc non Af Amer: 55 mL/min — ABNORMAL LOW (ref >60)
Glucose, Bld: 131 mg/dL — ABNORMAL HIGH (ref 70–99)
Potassium: 3.7 mmol/L (ref 3.5–5.1)
Sodium: 138 mmol/L (ref 135–145)
Total Bilirubin: 0.6 mg/dL (ref 0.3–1.2)
Total Protein: 6.1 g/dL — ABNORMAL LOW (ref 6.5–8.1)

## 2018-12-17 LAB — URINALYSIS, ROUTINE W REFLEX MICROSCOPIC
Bilirubin Urine: NEGATIVE
Glucose, UA: NEGATIVE mg/dL
Hgb urine dipstick: NEGATIVE
Ketones, ur: NEGATIVE mg/dL
Nitrite: NEGATIVE
Protein, ur: NEGATIVE mg/dL
Specific Gravity, Urine: 1.015 (ref 1.005–1.030)
pH: 7 (ref 5.0–8.0)

## 2018-12-17 LAB — CBC WITH DIFFERENTIAL/PLATELET
Abs Immature Granulocytes: 0.32 10*3/uL — ABNORMAL HIGH (ref 0.00–0.07)
Basophils Absolute: 0 10*3/uL (ref 0.0–0.1)
Basophils Relative: 0 %
Eosinophils Absolute: 0.1 10*3/uL (ref 0.0–0.5)
Eosinophils Relative: 1 %
HCT: 38.7 % — ABNORMAL LOW (ref 39.0–52.0)
Hemoglobin: 12 g/dL — ABNORMAL LOW (ref 13.0–17.0)
Immature Granulocytes: 3 %
Lymphocytes Relative: 8 %
Lymphs Abs: 0.8 10*3/uL (ref 0.7–4.0)
MCH: 27.7 pg (ref 26.0–34.0)
MCHC: 31 g/dL (ref 30.0–36.0)
MCV: 89.4 fL (ref 80.0–100.0)
Monocytes Absolute: 1.1 10*3/uL — ABNORMAL HIGH (ref 0.1–1.0)
Monocytes Relative: 10 %
Neutro Abs: 8.6 10*3/uL — ABNORMAL HIGH (ref 1.7–7.7)
Neutrophils Relative %: 78 %
Platelets: 428 10*3/uL — ABNORMAL HIGH (ref 150–400)
RBC: 4.33 MIL/uL (ref 4.22–5.81)
RDW: 15.3 % (ref 11.5–15.5)
WBC: 11 10*3/uL — ABNORMAL HIGH (ref 4.0–10.5)
nRBC: 1.5 % — ABNORMAL HIGH (ref 0.0–0.2)

## 2018-12-17 LAB — C-REACTIVE PROTEIN: CRP: 15.7 mg/dL — ABNORMAL HIGH (ref <1.0)

## 2018-12-17 LAB — MAGNESIUM: Magnesium: 1.9 mg/dL (ref 1.7–2.4)

## 2018-12-17 LAB — GLUCOSE, CAPILLARY
Glucose-Capillary: 114 mg/dL — ABNORMAL HIGH (ref 70–99)
Glucose-Capillary: 206 mg/dL — ABNORMAL HIGH (ref 70–99)
Glucose-Capillary: 397 mg/dL — ABNORMAL HIGH (ref 70–99)
Glucose-Capillary: 331 mg/dL — ABNORMAL HIGH (ref 70–99)

## 2018-12-17 LAB — D-DIMER, QUANTITATIVE: D-Dimer, Quant: 1.77 ug/mL-FEU — ABNORMAL HIGH (ref 0.00–0.50)

## 2018-12-17 LAB — BRAIN NATRIURETIC PEPTIDE: B Natriuretic Peptide: 47.5 pg/mL (ref 0.0–100.0)

## 2018-12-17 LAB — PROCALCITONIN: Procalcitonin: 0.4 ng/mL

## 2018-12-17 LAB — LACTATE DEHYDROGENASE: LDH: 432 U/L — ABNORMAL HIGH (ref 98–192)

## 2018-12-17 MED ORDER — NITROGLYCERIN 2 % TD OINT
1.0000 [in_us] | TOPICAL_OINTMENT | Freq: Four times a day (QID) | TRANSDERMAL | Status: DC
  Administered 2018-12-17 – 2018-12-18 (×5): 1 [in_us] via TOPICAL
  Filled 2018-12-17: qty 30

## 2018-12-17 MED ORDER — METOPROLOL TARTRATE 5 MG/5ML IV SOLN
5.0000 mg | Freq: Four times a day (QID) | INTRAVENOUS | Status: DC | PRN
  Administered 2018-12-17: 5 mg via INTRAVENOUS
  Filled 2018-12-17: qty 5

## 2018-12-17 MED ORDER — DILTIAZEM HCL ER COATED BEADS 120 MG PO CP24
240.0000 mg | ORAL_CAPSULE | Freq: Every day | ORAL | Status: DC
  Administered 2018-12-17 – 2018-12-18 (×2): 240 mg via ORAL
  Filled 2018-12-17 (×3): qty 2

## 2018-12-17 MED ORDER — HYDROCHLOROTHIAZIDE 25 MG PO TABS
50.0000 mg | ORAL_TABLET | Freq: Every day | ORAL | Status: DC
  Administered 2018-12-17 – 2018-12-18 (×2): 50 mg via ORAL
  Filled 2018-12-17 (×2): qty 2

## 2018-12-17 MED ORDER — METHYLPREDNISOLONE SODIUM SUCC 125 MG IJ SOLR
60.0000 mg | Freq: Two times a day (BID) | INTRAMUSCULAR | Status: DC
  Administered 2018-12-17 – 2018-12-19 (×6): 60 mg via INTRAVENOUS
  Filled 2018-12-17 (×6): qty 2

## 2018-12-17 MED ORDER — FUROSEMIDE 10 MG/ML IJ SOLN
60.0000 mg | Freq: Once | INTRAMUSCULAR | Status: AC
  Administered 2018-12-17: 08:00:00 60 mg via INTRAVENOUS
  Filled 2018-12-17: qty 6

## 2018-12-17 NOTE — TOC Progression Note (Signed)
Transition of Care [facility]) - Progression Note    Patient Details  Name: Derrick Bender MRN: [id number] Date of Birth: 1946/01/07  Transition of [facility]) CM/SW Contact  Shade Flood, LCSW Phone Number: 12/17/2018, 1:36 PM  Clinical Narrative:     TOC following. MD indicating pt may be ready for dc later this week. HH with [facility] is arranged already. PT recommending wheeled walker and 3 in 1 commode at dc. Spoke with pt's wife by phone today to address. Pt's wife states that she "thinks" pt already has a walker and a commode at home so they do not need TOC to order for them. MD had already been contacted to update on PT recommendations and orders will be entered so in the event pt's wife says they need it after all, TOC will just need to contact DME company. TOC will follow.  Expected Discharge Plan: [place] Barriers to Discharge: Continued Medical Work up  Expected Discharge Plan and Services Expected Discharge Plan: [place] In-house Referral: Clinical Social Work   Post Acute Care Choice: [place] arrangements for the past 2 months: [place]                 DME Arranged: N/A         HH Arranged: PT, OT [place] Agency: [place] ([place]) Date [place]: 12/13/18 Time [place]: [phone] Representative spoke with at [place]: [place] ([place]) Interventions    Readmission Risk Interventions No flowsheet data found.

## 2018-12-17 NOTE — Progress Notes (Addendum)
Spoke with pt wife Derrick Bender and updated on pt condition and plan of care. Answered questions and concerns.   Spoke with daughter Derrick Bender and updated to show her as primary contact. Per Lattie Haw, her mother Derrick Bender has dementia and is not appropriate to be contacted as primary for medical information.

## 2018-12-17 NOTE — Progress Notes (Signed)
Inpatient Diabetes Program Recommendations  AACE/ADA: New Consensus Statement on Inpatient Glycemic Control (2015)  Target Ranges:  Prepandial:   less than 140 mg/dL      Peak postprandial:   less than 180 mg/dL (1-2 hours)      Critically ill patients:  140 - 180 mg/dL   Lab Results  Component Value Date   GLUCAP 206 (H) 12/17/2018   HGBA1C 6.5 (H) 12/10/2018    Review of Glycemic Control  Diabetes history: DM2 Outpatient Diabetes medications: diet-controlled Current orders for Inpatient glycemic control: Novolog 0-9 units tidwc.  On Solumedrol 60 mg Q12H. Post-prandials elevated. May benefit from addition of Novolog 2 units tidwc for meal coverage insulin.  Inpatient Diabetes Program Recommendations:     Consider adding Novolog 2 units tidwc for meal coverage insulin if pt eats > 50% meals, while on steroids.  Add CHO mod med to Soft diet.  Will follow daily.  Thank you. Lorenda Peck, RD, LDN, CDE Inpatient Diabetes Coordinator [phone]

## 2018-12-17 NOTE — Progress Notes (Signed)
Pt increased from 4L [place] to 15L NRB overnight. Paged Dr Candiss Norse and obtained order for Lasix and solumedrol stat. Pt moved to the chair per Dr. Candiss Norse and O2 able to be transitioned to HFNC 15L. Pt maintaining sats in low 90's.

## 2018-12-17 NOTE — Progress Notes (Signed)
PROGRESS NOTE                                                                                                                                                                                                             Patient Demographics:    Derrick Bender, is a 73 y.o. male, DOB - 04/21/1945, [id number]  Outpatient Primary MD for the patient is Glenda Chroman, MD   Admit date - 12/09/2018   LOS - 8  No chief complaint on file.      Brief Narrative: Patient is a 72 y.o. male with PMHx of-not on home O2, DM-2-(diet controlled at home), HTN, CVA, seizure disorder, prostate cancer s/p total prostatectomy, OSA on CPAP-who was transferred from [facility] after he was found to have persistent fever, acute hypoxemic respiratory failure in the setting of COVID-19 pneumonia.  His daughter was recently tested positive for COVID-19.   Subjective:   Patient in bed, appears comfortable, denies any headache, no fever, no chest pain or pressure, ++ shortness of breath , no abdominal pain. No focal weakness.    Assessment  & Plan :   Acute Hypoxic Resp Failure due to Covid 19 Viral pneumonia with concomitant bacterial pneumonia:   He had developed severe hypoxic respiratory failure and was started on IV steroids and Remdisvir combination, he did not receive Actemra as his procalcitonin levels were elevated.  He is still on 2-4 L of nasal cannula oxygen, unfortunately he has started to run fevers since the evening of 12/14/2018.  There was a suspicion of possible overlying aspiration bacterial pneumonia hence he was started on IV Rocephin and Flagyl combination on 12/14/2018.  Speech therapy is following and currently on dysphagia 3 diet.  His respiratory status remains tenuous, steroid dose was increased on 12/16/2018, also aggressively diuresed on 12/17/2018.  Currently on nonrebreather will monitor closely.    COVID-19 Labs: Recent  Labs    12/15/18 0440 12/16/18 0815 12/17/18 0345  DDIMER 1.17* 2.12* 1.77*  FERRITIN 132 115  --   LDH  --  452* 432*  CRP 17.1* 15.2* 15.7*    Lab Results  Component Value Date   SARSCOV2NAA NOT DETECTED 10/14/2018   [place] Not Detected 09/30/2018    Hepatic Function Latest Ref Rng & Units 12/17/2018 12/16/2018 12/15/2018  Total Protein 6.5 - 8.1 g/dL 6.1(L)  6.5 5.7(L)  Albumin 3.5 - 5.0 g/dL 2.5(L) 2.6(L) 2.3(L)  AST 15 - 41 U/L 23 25 22   ALT 0 - 44 U/L 23 27 29   Alk Phosphatase 38 - 126 U/L 75 86 72  Total Bilirubin 0.3 - 1.2 mg/dL 0.6 0.4 0.7    COVID-19 Medications:  Steroids: 8/25>> Remdesivir: 8/25>> Actemra: Not indicated-given concomitant bacterial pneumonia and elevated procalcitonin levels Convalescent Plasma:N/A Research Studies:N/A    Acute on chronic diastolic CHF EF 79% on last echocardiogram in 2017.  Will diurese with IV Lasix and monitor.  Fevers.  See #1 above.  Stable bladder scan and UA.  Transaminitis: COVID-19 related.  Has resolved.  OSA - CPAP QHS at home, continue nasal cannula oxygen here.  AKI: Mild likely hemodynamically mediated, continue to monitor.  COPD: No evidence of exacerbation-continue bronchodilators.  DM-2 (A1c 6.5): Diet controlled at home-CBGs stable with SSI  HTN: In poor control on combination of Cardizem and lisinopril.  Will add hydralazine for better control both scheduled and as needed.  Seizure disorder: Continue Dilantin  History of CVA: No focal deficits-continue Plavix  History of 13 x 10 mm pituitary lesion: Follows with neurosurgery at [facility]  History of prostate cancer: Stable for outpatient follow-up with his outpatient physicians  Debility/deconditioning: Secondary to acute illness-appreciate PT/OT-recommendations are for home health services.  OSA: Claims uses CPAP at night-currently due to our facility's policy of no CPAP-we will be on home oxygen.    Obesity: BMI of 32.   Follow with PCP for weight loss.    Condition - guarded  Family Communication  : Updated wife and daughter in detail on 12/15/2018.  Daughter wants to be updated daily not the wife. Daughter on 8/31, 9/1  Code Status :  DNR  Diet :  Diet Order            DIET SOFT Room service appropriate? Yes; Fluid consistency: Thin  Diet effective now               Disposition Plan  :  Remain hospitalized-home in next few days-depending on clinical course  Consults  :  None  Procedures  :  None  DVT Prophylaxis  :  Lovenox   Antibiotics  :    Antibiotics Given (last 72 hours)    Date/Time Action Medication Dose Rate   12/14/18 1445 Given   metroNIDAZOLE (FLAGYL) tablet 500 mg 500 mg    12/14/18 2232 Given   metroNIDAZOLE (FLAGYL) tablet 500 mg 500 mg    12/15/18 0240 Given   metroNIDAZOLE (FLAGYL) tablet 500 mg 500 mg    12/15/18 1107 New Bag/Given   cefTRIAXone (ROCEPHIN) 2 g in sodium chloride 0.9 % 100 mL IVPB 2 g 200 mL/hr   12/15/18 1430 Given   metroNIDAZOLE (FLAGYL) tablet 500 mg 500 mg    12/15/18 2240 Given   metroNIDAZOLE (FLAGYL) tablet 500 mg 500 mg    12/16/18 0616 Given   metroNIDAZOLE (FLAGYL) tablet 500 mg 500 mg    12/16/18 0840 New Bag/Given   cefTRIAXone (ROCEPHIN) 2 g in sodium chloride 0.9 % 100 mL IVPB 2 g 200 mL/hr   12/16/18 1429 Given   metroNIDAZOLE (FLAGYL) tablet 500 mg 500 mg    12/16/18 2149 Given   metroNIDAZOLE (FLAGYL) tablet 500 mg 500 mg    12/17/18 0631 Given   metroNIDAZOLE (FLAGYL) tablet 500 mg 500 mg    12/17/18 0949 New Bag/Given   cefTRIAXone (ROCEPHIN) 2 g in  sodium chloride 0.9 % 100 mL IVPB 2 g 200 mL/hr     Inpatient Medications  Scheduled Meds: . atorvastatin  10 mg Oral QHS  . benzonatate  200 mg Oral TID  . clopidogrel  75 mg Oral Daily  . diltiazem  240 mg Oral Daily  . enoxaparin (LOVENOX) injection  40 mg Subcutaneous Q24H  . escitalopram  20 mg Oral QHS  . hydrochlorothiazide  50 mg Oral Daily  . insulin aspart   0-9 Units Subcutaneous TID WC  . Ipratropium-Albuterol  1 puff Inhalation Q6H  . methylPREDNISolone (SOLU-MEDROL) injection  60 mg Intravenous Q12H  . metroNIDAZOLE  500 mg Oral Q8H  . mometasone-formoterol  2 puff Inhalation BID  . nitroGLYCERIN  1 inch Topical Q6H  . oxybutynin  5 mg Oral TID  . pantoprazole  40 mg Oral Daily  . phenytoin  100 mg Oral Q8H  . sodium chloride flush  10-40 mL Intracatheter Q12H  . topiramate  100 mg Oral BID   Continuous Infusions:  PRN Meds:.acetaminophen, albuterol, chlorpheniramine-HYDROcodone, hydrALAZINE, metoprolol tartrate, polyethylene glycol, prochlorperazine, sodium chloride flush   Time Spent in minutes  25  See all Orders from today for further details   Lala Lund M.D on 12/17/2018 at 10:43 AM  To page go to [email or web address] - use universal password  [facility] -  Office  [phone]    Objective:   Vitals:   12/17/18 0850 12/17/18 0900 12/17/18 0914 12/17/18 0918  BP:      Pulse: (!) 124 (!) 121 (!) 120 (!) 121  Resp: (!) 54 (!) 27 (!) 36 (!) 23  Temp:      TempSrc:      SpO2: (!) 87% 97% 91% (!) 89%  Weight:      Height:        Wt Readings from Last 3 Encounters:  12/10/18 99 kg  11/16/15 100.2 kg     Intake/Output Summary (Last 24 hours) at 12/17/2018 1043 Last data filed at 12/16/2018 1931 Gross per 24 hour  Intake 1193.47 ml  Output 485 ml  Net 708.47 ml     Physical Exam  Awake Alert, Oriented X 3, No new F.N deficits, Normal affect Leroy.AT,PERRAL Supple Neck,No JVD, No cervical lymphadenopathy appriciated.  Symmetrical Chest wall movement, Good air movement bilaterally, +ve rales RRR,No Gallops, Rubs or new Murmurs, No Parasternal Heave +ve B.Sounds, Abd Soft, No tenderness, No organomegaly appriciated, No rebound - guarding or rigidity. No Cyanosis, Clubbing or edema, No new Rash or bruise   Data Review:    CBC Recent Labs  Lab 12/13/18 0134 12/14/18 0300 12/15/18 0440 12/16/18 0815  12/17/18 0345  WBC 10.2 9.7 11.0* 12.2* 11.0*  HGB 10.8* 10.9* 10.6* 12.4* 12.0*  HCT 34.0* 34.6* 34.0* 39.4 38.7*  PLT 223 261 281 420* 428*  MCV 88.8 89.4 89.5 89.1 89.4  MCH 28.2 28.2 27.9 28.1 27.7  MCHC 31.8 31.5 31.2 31.5 31.0  RDW 14.9 15.3 15.2 15.3 15.3  LYMPHSABS 1.0 1.0 0.8 1.1 0.8  MONOABS 0.8 0.8 0.8 1.2* 1.1*  EOSABS 0.0 0.0 0.0 0.1 0.1  BASOSABS 0.0 0.0 0.0 0.0 0.0    Chemistries  Recent Labs  Lab 12/11/18 0350  12/13/18 0134 12/14/18 0300 12/15/18 0440 12/16/18 0815 12/17/18 0345  NA 140   < > 143 144 144 142 138  K 3.5   < > 3.3* 3.3* 3.8 3.0* 3.7  CL 110   < > 110 110 112* 104 104  CO2 22   < >  25 25 25 26 22   GLUCOSE 197*   < > 147* 163* 143* 122* 131*  BUN 25*   < > 31* 30* 27* 21 22  CREATININE 1.20   < > 1.20 1.31* 1.16 1.28* 1.29*  CALCIUM 7.7*   < > 8.0* 8.2* 8.3* 8.8* 8.3*  MG 2.3  --   --   --   --  1.9 1.9  AST 72*   < > 51* 32 22 25 23   ALT 47*   < > 44 37 29 27 23   ALKPHOS 78   < > 76 81 72 86 75  BILITOT 0.6   < > 0.4 0.2* 0.7 0.4 0.6   < > = values in this interval not displayed.   ------------------------------------------------------------------------------------------------------------------ No results for input(s): CHOL, HDL, LDLCALC, TRIG, CHOLHDL, LDLDIRECT in the last 72 hours.  Lab Results  Component Value Date   HGBA1C 6.5 (H) 12/10/2018   ------------------------------------------------------------------------------------------------------------------ No results for input(s): TSH, T4TOTAL, T3FREE, THYROIDAB in the last 72 hours.  Invalid input(s): FREET3 ------------------------------------------------------------------------------------------------------------------ Recent Labs    12/15/18 0440 12/16/18 0815  FERRITIN 132 115    Coagulation profile No results for input(s): INR, PROTIME in the last 168 hours.  Recent Labs    12/16/18 0815 12/17/18 0345  DDIMER 2.12* 1.77*    Cardiac Enzymes No results for  input(s): CKMB, TROPONINI, MYOGLOBIN in the last 168 hours.  Invalid input(s): CK ------------------------------------------------------------------------------------------------------------------    Component Value Date/Time   BNP 47.5 12/17/2018 0345    Micro Results Recent Results (from the past 240 hour(s))  Culture, blood (routine x 2)     Status: None (Preliminary result)   Collection Time: 12/14/18  4:30 PM   Specimen: Left Antecubital; Blood  Result Value Ref Range Status   Specimen Description   Final    LEFT ANTECUBITAL Performed at [facility] [address], [place], [place] [address]    Special Requests   Final    BOTTLES DRAWN AEROBIC ONLY Blood Culture adequate volume Performed at [facility] [address] [place], [place] [address]    Culture   Final    NO GROWTH 3 DAYS Performed at [facility], [place] [address] [place], [place] [address]    Report Status PENDING  Incomplete  Culture, blood (routine x 2)     Status: None (Preliminary result)   Collection Time: 12/14/18  4:32 PM   Specimen: BLOOD LEFT HAND  Result Value Ref Range Status   Specimen Description   Final    BLOOD LEFT HAND Performed at [facility] [address] [place], [place] [address]    Special Requests   Final    BOTTLES DRAWN AEROBIC ONLY Blood Culture adequate volume Performed at [facility] [address] [place], [place] [address]    Culture   Final    NO GROWTH 3 DAYS Performed at [facility], [place] [address] [place], [place] [address]    Report Status PENDING  Incomplete    Radiology Reports Dg Chest Port 1 View  Result Date: 12/17/2018 CLINICAL DATA:  73 year old male with history of pneumonia from COVID-19. EXAM: PORTABLE CHEST 1 VIEW COMPARISON:  Chest x-ray 12/15/2018. FINDINGS: Lung volumes are low. Patchy multifocal airspace consolidation throughout the lungs bilaterally,  asymmetrically distributed (right greater than left) with relative sparing of the left upper lobe. Overall, aeration continues to worsened compared to the prior study. No evidence of  pulmonary edema. Heart size is mildly enlarged. Upper mediastinal contours are unremarkable. IMPRESSION: 1. Worsening multilobar bilateral pneumonia, as above. 2. Mild cardiomegaly. Electronically Signed   By: Vinnie Langton M.D.   On: 12/17/2018 08:53   Dg Chest Port 1 View  Result Date: 12/15/2018 CLINICAL DATA:  Dyspnea EXAM: PORTABLE CHEST 1 VIEW COMPARISON:  Chest radiograph from one day prior. FINDINGS: Stable cardiomediastinal silhouette with normal heart size. No pneumothorax. No pleural effusion. Patchy opacities throughout both lungs, with relative sparing of the upper left lung, not substantially changed. IMPRESSION: No substantial change in extensive patchy bilateral lung opacities compatible with multilobar pneumonia. Electronically Signed   By: Ilona Sorrel M.D.   On: 12/15/2018 10:11   Dg Chest Port 1 View  Result Date: 12/10/2018 CLINICAL DATA:  Pneumonia due to COVID-19 EXAM: PORTABLE CHEST 1 VIEW COMPARISON:  Portable exam 0620 hours compared to 12/09/2018 FINDINGS: Enlargement of cardiac silhouette. Tortuous aorta. Pulmonary vascularity normal. BILATERAL pulmonary infiltrates again seen, little changed. No pleural effusion or pneumothorax. Bones unremarkable. IMPRESSION: Persistent pulmonary infiltrates bilaterally. Electronically Signed   By: Lavonia Dana M.D.   On: 12/10/2018 08:59   Dg Chest Port 1v Same Day  Result Date: 12/14/2018 CLINICAL DATA:  Shortness of breath EXAM: PORTABLE CHEST 1 VIEW COMPARISON:  December 10, 2018 FINDINGS: No pneumothorax. There is opacity diffusely throughout the right lung which is worsened. There is opacity in left base which has worsened. No other interval changes. IMPRESSION: Worsening bilateral pulmonary opacities/infiltrates, diffusely distributed on the right and  localize in the left base on the left. This may represent an infectious process. Asymmetric pulmonary edema is considered less likely but possible. Electronically Signed   By: Dorise Bullion III M.D   On: 12/14/2018 10:52

## 2018-12-17 NOTE — Progress Notes (Signed)
Occupational Therapy Treatment Patient Details Name: Derrick Bender MRN: [id number] DOB: March 09, 1946 Today's Date: 12/17/2018    History of present illness Patient is a 73 y.o. male with PMHx including, DM-2, HTN, CVA, seizure disorder, prostate cancer s/p total prostatectomy. Presented to ED with persistent fever, acute hypoxemic respiratory failure (not typically on home O2) in the setting of COVID-19 pneumonia.   OT comments  Pt seen on 4L. Appears confused and difficult to understand at times. Pt complaining of stomach pain and needing to have a BM. Transferred to [facility] with min A and did not know he had been incontinent of BM. Pt not oriented to time and unable to recall information after multiple repetitions. Desat to 89 with RR increasing to 36 during activity. Required Max A to clean himself after incontinent episode. Nursing had conversation with daughter who states that pt's wife has dementia, therefore all information regarding his care needs to go through her. At this time, feel pt more appropriate for SNF unless daughter can provide 24/7 assistance to her father - pending progress. Daughter states that her Dad has not been diagnosed with dementia but that he has episodes of confusion and delirium as well. Will continue to follow acutely.   Follow Up Recommendations  SNF vs Home health OT;Supervision/Assistance - 24 hour  - pending progress   Equipment Recommendations  3 in 1 bedside commode    Recommendations for Other Services PT consult    Precautions / Restrictions Precautions Precautions: Fall Precaution Comments: monitor sats       Mobility Bed Mobility Overal bed mobility: Needs Assistance Bed Mobility: Supine to Sit     Supine to sit: Min guard     General bed mobility comments: Heavy use of bed rail; increased time  Transfers Overall transfer level: Needs assistance   Transfers: Sit to/from Stand;Stand Pivot Transfers Sit to Stand: Min assist Stand pivot  transfers: Min assist            Balance Overall balance assessment: Needs assistance   Sitting balance-Leahy Scale: Good       Standing balance-Leahy Scale: Poor Standing balance comment: reliant on external support                           ADL either performed or assessed with clinical judgement   ADL Overall ADL's : Needs assistance/impaired     Grooming: Set up;Supervision/safety;Sitting Grooming Details (indicate cue type and reason): VC to complete tasks Upper Body Bathing: Minimal assistance;Sitting   Lower Body Bathing: Moderate assistance;Sitting/lateral leans   Upper Body Dressing : Minimal assistance;Sitting   Lower Body Dressing: Moderate assistance;Sit to/from stand   Toilet Transfer: Minimal assistance;RW;BSC;Stand-pivot(took a few steps)   Toileting- Clothing Manipulation and Hygiene: Maximal assistance Toileting - Clothing Manipulation Details (indicate cue type and reason): incontinenet BM     Functional mobility during ADLs: Minimal assistance;Rolling walker;Cueing for safety General ADL Comments: RR mid - high 30s     Vision       Perception     Praxis      Cognition Arousal/Alertness: Awake/alert Behavior During Therapy: Anxious Overall Cognitive Status: Impaired/Different from baseline Area of Impairment: Orientation;Attention;Memory;Safety/judgement;Awareness;Problem solving                 Orientation Level: Disoriented to;Time Current Attention Level: Sustained Memory: Decreased short-term memory Following Commands: Follows one step commands consistently Safety/Judgement: Decreased awareness of safety;Decreased awareness of deficits Awareness: Emergent Problem Solving: Slow processing General  Comments: Pt unable to remember infomation after 1 min delay; very slow processing; more confused during mobility; unaware of being incontinent        Exercises     Shoulder Instructions       General Comments       Pertinent Vitals/ Pain       Pain Assessment: Faces Faces Pain Scale: Hurts little more Pain Location: abdominal pain Pain Descriptors / Indicators: Cramping;Grimacing Pain Intervention(s): Limited activity within patient's tolerance  Home Living                                          Prior Functioning/Environment              Frequency  Min 3X/week        Progress Toward Goals  OT Goals(current goals can now be found in the care plan section)  Progress towards OT goals: Progressing toward goals  Acute Rehab OT Goals Patient Stated Goal: to get in the bed OT Goal Formulation: With patient Time For Goal Achievement: 12/25/18 Potential to Achieve Goals: Good ADL Goals Pt Will Perform Grooming: with supervision;standing Pt Will Perform Upper Body Dressing: with modified independence;sitting Pt Will Perform Lower Body Dressing: with supervision;sit to/from stand Pt Will Transfer to Toilet: with modified independence;ambulating Pt Will Perform Toileting - Clothing Manipulation and hygiene: with modified independence;sit to/from stand Pt/caregiver will Perform Home Exercise Program: Both right and left upper extremity;With theraband;Independently;With written HEP provided Additional ADL Goal #1: Pt will verbalized 3 energy conservation strategies with ADL at independent level  Plan Discharge plan remains appropriate    Co-evaluation                 AM-PAC OT "6 Clicks" Daily Activity     Outcome Measure   Help from another person eating meals?: None Help from another person taking care of personal grooming?: A Little Help from another person toileting, which includes using toliet, bedpan, or urinal?: A Lot Help from another person bathing (including washing, rinsing, drying)?: A Lot Help from another person to put on and taking off regular upper body clothing?: A Little Help from another person to put on and taking off regular lower body  clothing?: A Lot 6 Click Score: 16    End of Session Equipment Utilized During Treatment: Rolling walker;Oxygen(4L)  OT Visit Diagnosis: Unsteadiness on feet (R26.81);Other abnormalities of gait and mobility (R26.89);Muscle weakness (generalized) (M62.81)   Activity Tolerance Patient limited by pain;Patient limited by fatigue   Patient Left in bed;with call bell/phone within reach;with bed alarm set   Nurse Communication Mobility status;Other (comment)(pain)        Time: [id number] OT Time Calculation (min): 26 min  Charges: OT General Charges $OT Visit: 1 Visit OT Treatments $Self Care/Home Management : 23-37 mins  Maurie Boettcher, OT/L   Acute OT Clinical Specialist [place] Pager [phone] Office [phone]    [facility] 12/17/2018, 5:22 PM

## 2018-12-17 NOTE — Progress Notes (Signed)
  Speech Language Pathology Treatment: Dysphagia  Patient Details Name: Derrick Bender MRN: [id number] DOB: 1945-05-05 Today's Date: 12/17/2018 Time: 1146-4314 SLP Time Calculation (min) (ACUTE ONLY): 75 min  Assessment / Plan / Recommendation Clinical Impression  Pt demonstrates ongoing tolerance of mechanical soft diet. Though he did not eat much of the food on his lunch tray he was eager to eat his sugar cookie. Observed pt self feed and alternate between bites of solid and straw sips of Sprite with no apparent difficulty. Reiterated basic aspiration precautions. Otherwise pt may continue soft foods and thin liquids. No further SLP intervention needed.   HPI HPI: Patient is a 73 y.o. male with PMHx including, DM-2, HTN, CVA, seizure disorder, prostate cancer s/p total prostatectomy. Presented to ED with persistent fever, acute hypoxemic respiratory failure (not typically on home O2) in the setting of COVID-19 pneumonia. Pt had a choughing episode after swallowing tylenol and SLP swallow evaluation was ordered.      SLP Plan  All goals met       Recommendations  Diet recommendations: Dysphagia 3 (mechanical soft);Thin liquid Liquids provided via: Cup;Straw Medication Administration: Whole meds with liquid Supervision: Patient able to self feed Compensations: Slow rate;Small sips/bites Postural Changes and/or Swallow Maneuvers: Seated upright 90 degrees                Oral Care Recommendations: Oral care BID Follow up Recommendations: None Plan: All goals met       GO                Derrick Baltimore, MA CCC-SLP  Acute Rehabilitation Services Pager [phone] Office [phone]   Derrick Bender 12/17/2018, 2:06 PM

## 2018-12-18 LAB — URINE CULTURE: Culture: NO GROWTH

## 2018-12-18 LAB — COMPREHENSIVE METABOLIC PANEL
ALT: 22 U/L (ref 0–44)
AST: 18 U/L (ref 15–41)
Albumin: 2.4 g/dL — ABNORMAL LOW (ref 3.5–5.0)
Alkaline Phosphatase: 73 U/L (ref 38–126)
Anion gap: 11 (ref 5–15)
BUN: 32 mg/dL — ABNORMAL HIGH (ref 8–23)
CO2: 25 mmol/L (ref 22–32)
Calcium: 8 mg/dL — ABNORMAL LOW (ref 8.9–10.3)
Chloride: 102 mmol/L (ref 98–111)
Creatinine, Ser: 1.45 mg/dL — ABNORMAL HIGH (ref 0.61–1.24)
GFR calc Af Amer: 55 mL/min — ABNORMAL LOW (ref >60)
GFR calc non Af Amer: 47 mL/min — ABNORMAL LOW (ref >60)
Glucose, Bld: 255 mg/dL — ABNORMAL HIGH (ref 70–99)
Potassium: 4.2 mmol/L (ref 3.5–5.1)
Sodium: 138 mmol/L (ref 135–145)
Total Bilirubin: 0.5 mg/dL (ref 0.3–1.2)
Total Protein: 6.1 g/dL — ABNORMAL LOW (ref 6.5–8.1)

## 2018-12-18 LAB — GLUCOSE, CAPILLARY
Glucose-Capillary: 264 mg/dL — ABNORMAL HIGH (ref 70–99)
Glucose-Capillary: 335 mg/dL — ABNORMAL HIGH (ref 70–99)
Glucose-Capillary: 302 mg/dL — ABNORMAL HIGH (ref 70–99)
Glucose-Capillary: 193 mg/dL — ABNORMAL HIGH (ref 70–99)

## 2018-12-18 LAB — CBC WITH DIFFERENTIAL/PLATELET
Abs Immature Granulocytes: 0.26 10*3/uL — ABNORMAL HIGH (ref 0.00–0.07)
Basophils Absolute: 0 10*3/uL (ref 0.0–0.1)
Basophils Relative: 0 %
Eosinophils Absolute: 0 10*3/uL (ref 0.0–0.5)
Eosinophils Relative: 0 %
HCT: 37.6 % — ABNORMAL LOW (ref 39.0–52.0)
Hemoglobin: 11.9 g/dL — ABNORMAL LOW (ref 13.0–17.0)
Immature Granulocytes: 2 %
Lymphocytes Relative: 5 %
Lymphs Abs: 0.7 10*3/uL (ref 0.7–4.0)
MCH: 28.1 pg (ref 26.0–34.0)
MCHC: 31.6 g/dL (ref 30.0–36.0)
MCV: 88.9 fL (ref 80.0–100.0)
Monocytes Absolute: 0.8 10*3/uL (ref 0.1–1.0)
Monocytes Relative: 6 %
Neutro Abs: 11.6 10*3/uL — ABNORMAL HIGH (ref 1.7–7.7)
Neutrophils Relative %: 87 %
Platelets: 451 10*3/uL — ABNORMAL HIGH (ref 150–400)
RBC: 4.23 MIL/uL (ref 4.22–5.81)
RDW: 15.4 % (ref 11.5–15.5)
WBC: 13.4 10*3/uL — ABNORMAL HIGH (ref 4.0–10.5)
nRBC: 0.7 % — ABNORMAL HIGH (ref 0.0–0.2)

## 2018-12-18 LAB — LACTATE DEHYDROGENASE: LDH: 392 U/L — ABNORMAL HIGH (ref 98–192)

## 2018-12-18 LAB — MAGNESIUM: Magnesium: 1.9 mg/dL (ref 1.7–2.4)

## 2018-12-18 LAB — BRAIN NATRIURETIC PEPTIDE: B Natriuretic Peptide: 59.3 pg/mL (ref 0.0–100.0)

## 2018-12-18 LAB — D-DIMER, QUANTITATIVE: D-Dimer, Quant: 1.42 ug/mL-FEU — ABNORMAL HIGH (ref 0.00–0.50)

## 2018-12-18 LAB — C-REACTIVE PROTEIN: CRP: 15.6 mg/dL — ABNORMAL HIGH (ref <1.0)

## 2018-12-18 MED ORDER — INSULIN ASPART 100 UNIT/ML ~~LOC~~ SOLN
0.0000 [IU] | Freq: Three times a day (TID) | SUBCUTANEOUS | Status: DC
  Administered 2018-12-18: 11 [IU] via SUBCUTANEOUS
  Administered 2018-12-19: 15 [IU] via SUBCUTANEOUS
  Administered 2018-12-19: 10:00:00 5 [IU] via SUBCUTANEOUS
  Administered 2018-12-19: 11 [IU] via SUBCUTANEOUS
  Administered 2018-12-20: 3 [IU] via SUBCUTANEOUS
  Administered 2018-12-20: 18:00:00 5 [IU] via SUBCUTANEOUS
  Administered 2018-12-20: 13:00:00 15 [IU] via SUBCUTANEOUS
  Administered 2018-12-21 (×2): 5 [IU] via SUBCUTANEOUS
  Administered 2018-12-22: 13:00:00 3 [IU] via SUBCUTANEOUS

## 2018-12-18 MED ORDER — ONDANSETRON HCL 4 MG/2ML IJ SOLN
4.0000 mg | Freq: Four times a day (QID) | INTRAMUSCULAR | Status: DC | PRN
  Administered 2018-12-21: 4 mg via INTRAVENOUS
  Filled 2018-12-18: qty 2

## 2018-12-18 MED ORDER — DILTIAZEM HCL ER COATED BEADS 120 MG PO CP24
120.0000 mg | ORAL_CAPSULE | Freq: Once | ORAL | Status: AC
  Administered 2018-12-18: 13:00:00 120 mg via ORAL
  Filled 2018-12-18: qty 1

## 2018-12-18 MED ORDER — NITROGLYCERIN 2 % TD OINT
0.5000 [in_us] | TOPICAL_OINTMENT | Freq: Four times a day (QID) | TRANSDERMAL | Status: DC
  Administered 2018-12-18 – 2018-12-19 (×4): 0.5 [in_us] via TOPICAL
  Filled 2018-12-18: qty 30

## 2018-12-18 MED ORDER — INSULIN ASPART 100 UNIT/ML ~~LOC~~ SOLN
0.0000 [IU] | Freq: Every day | SUBCUTANEOUS | Status: DC
  Administered 2018-12-19: 2 [IU] via SUBCUTANEOUS

## 2018-12-18 MED ORDER — INSULIN GLARGINE 100 UNIT/ML ~~LOC~~ SOLN
25.0000 [IU] | Freq: Every day | SUBCUTANEOUS | Status: DC
  Administered 2018-12-18 – 2018-12-19 (×2): 25 [IU] via SUBCUTANEOUS
  Filled 2018-12-18 (×3): qty 0.25

## 2018-12-18 MED ORDER — INSULIN ASPART 100 UNIT/ML ~~LOC~~ SOLN
4.0000 [IU] | Freq: Three times a day (TID) | SUBCUTANEOUS | Status: DC
  Administered 2018-12-18 – 2018-12-19 (×4): 4 [IU] via SUBCUTANEOUS

## 2018-12-18 MED ORDER — DILTIAZEM HCL ER COATED BEADS 180 MG PO CP24
300.0000 mg | ORAL_CAPSULE | Freq: Every day | ORAL | Status: DC
  Administered 2018-12-19 – 2018-12-22 (×4): 300 mg via ORAL
  Filled 2018-12-18 (×4): qty 1

## 2018-12-18 NOTE — Progress Notes (Signed)
Occupational Therapy Treatment Patient Details Name: JOBANI ARABIE MRN: [id number] DOB: 24-Nov-1945 Today's Date: 12/18/2018    History of present illness Patient is a 73 y.o. male with PMHx including, DM-2, HTN, CVA, seizure disorder, prostate cancer s/p total prostatectomy. Presented to ED with persistent fever, acute hypoxemic respiratory failure (not typically on home O2) in the setting of COVID-19 pneumonia.   OT comments  Pt requesting assist to [facility] when OT entered room, Pt with [facility] out of nose, on RA and desaturating to 78%, got to [facility] with hand held assist (min A) and re-applied O2 4L via HFNC. Max A for peri care in standing (Pt dependent on external support for standing balance) Pt then able to do short ambulation with RW and min A around bed to recliner. With this activity, Pt's O2 dropped from 95% on 4L to 84%.  In chair able to participate in grooming with set up and cues. Pt with VERY flat affect today, continued decreased cognition. RR mid to high 30's throughout session as well. OT changing recommendations to SNF as Pt, while he is making progress, has not progressed to a level to go home safely at this time. OT will continue to work with this patient acutely with hopes that he will progress back to [facility] level.   Follow Up Recommendations  Supervision/Assistance - 24 hour;SNF    Equipment Recommendations  3 in 1 bedside commode    Recommendations for Other Services PT consult    Precautions / Restrictions Precautions Precautions: Fall Precaution Comments: monitor sats Restrictions Weight Bearing Restrictions: No       Mobility Bed Mobility Overal bed mobility: Needs Assistance Bed Mobility: Supine to Sit     Supine to sit: Min guard     General bed mobility comments: Heavy use of bed rail; increased time  Transfers Overall transfer level: Needs assistance Equipment used: Rolling walker (2 wheeled);1 person hand held assist Transfers: Sit to/from [employer] Sit to Stand: Min assist Stand pivot transfers: Min assist       General transfer comment: increased processing time despite urgency to get to [facility]    Balance Overall balance assessment: Needs assistance   Sitting balance-Leahy Scale: Good       Standing balance-Leahy Scale: Poor Standing balance comment: reliant on external support                           ADL either performed or assessed with clinical judgement   ADL Overall ADL's : Needs assistance/impaired     Grooming: Set up;Sitting;Wash/dry hands;Wash/dry face Grooming Details (indicate cue type and reason): VC to complete tasks                 Toilet Transfer: Minimal assistance;BSC;Stand-pivot(HHA, taking a few steps) Toilet Transfer Details (indicate cue type and reason): SPT due to urgency Toileting- Clothing Manipulation and Hygiene: Maximal assistance;Sit to/from stand Toileting - Clothing Manipulation Details (indicate cue type and reason): small incontinence in bed, but asking for assist to [facility], able to maintain standing but required BUE for balance and so OT performed pericare in standing     Functional mobility during ADLs: Minimal assistance;Rolling walker;Cueing for safety General ADL Comments: RR mid - high 30s     Vision       Perception     Praxis      Cognition Arousal/Alertness: Awake/alert Behavior During Therapy: Flat affect Overall Cognitive Status: Impaired/Different from baseline Area of Impairment:  Attention;Memory;Safety/judgement;Awareness;Problem solving                   Current Attention Level: Sustained Memory: Decreased short-term memory Following Commands: Follows one step commands consistently;Follows one step commands with increased time Safety/Judgement: Decreased awareness of safety;Decreased awareness of deficits Awareness: Emergent Problem Solving: Slow processing;Requires verbal cues General Comments: Pt requires  significantly more processing time from previous sessions with this OT. VERY flat today, said he needed to have a BM, then told me he was finished (but nothing in [facility]) no awareness of lines today, requires verbal cues throughout session        Exercises     Shoulder Instructions       General Comments      Pertinent Vitals/ Pain       Pain Assessment: No/denies pain Pain Intervention(s): Monitored during session;Repositioned  Home Living                                          Prior Functioning/Environment              Frequency  Min 3X/week        Progress Toward Goals  OT Goals(current goals can now be found in the care plan section)  Progress towards OT goals: Progressing toward goals  Acute Rehab OT Goals Patient Stated Goal: none stated today OT Goal Formulation: With patient Time For Goal Achievement: 12/25/18 Potential to Achieve Goals: Good ADL Goals Pt Will Perform Grooming: with supervision;standing Pt Will Perform Upper Body Dressing: with modified independence;sitting Pt Will Perform Lower Body Dressing: with supervision;sit to/from stand Pt Will Transfer to Toilet: with modified independence;ambulating Pt Will Perform Toileting - Clothing Manipulation and hygiene: with modified independence;sit to/from stand Pt/caregiver will Perform Home Exercise Program: Both right and left upper extremity;With theraband;Independently;With written HEP provided Additional ADL Goal #1: Pt will verbalized 3 energy conservation strategies with ADL at independent level  Plan Discharge plan needs to be updated;Frequency remains appropriate    Co-evaluation                 AM-PAC OT "6 Clicks" Daily Activity     Outcome Measure   Help from another person eating meals?: None Help from another person taking care of personal grooming?: A Little Help from another person toileting, which includes using toliet, bedpan, or urinal?: A Lot Help  from another person bathing (including washing, rinsing, drying)?: A Lot Help from another person to put on and taking off regular upper body clothing?: A Little Help from another person to put on and taking off regular lower body clothing?: A Lot 6 Click Score: 16    End of Session Equipment Utilized During Treatment: Rolling walker;Oxygen(4L)  OT Visit Diagnosis: Unsteadiness on feet (R26.81);Other abnormalities of gait and mobility (R26.89);Muscle weakness (generalized) (M62.81)   Activity Tolerance Patient limited by pain;Patient limited by fatigue   Patient Left with call bell/phone within reach;in chair;with chair alarm set   Nurse Communication Mobility status        Time: [id number] OT Time Calculation (min): 25 min  Charges: OT General Charges $OT Visit: 1 Visit OT Treatments $Self Care/Home Management : 23-37 mins  Hulda Humphrey OTR/L Acute Rehabilitation Services Pager: [phone] Office: [place] 12/18/2018, 6:47 PM

## 2018-12-18 NOTE — Progress Notes (Addendum)
PROGRESS NOTE                                                                                                                                                                                                             Patient Demographics:    Derrick Bender, is a 73 y.o. male, DOB - [birth year], FFM:[id number]  Outpatient Primary MD for the patient is Glenda Chroman, MD   Admit date - 12/09/2018   LOS - 9  No chief complaint on file.      Brief Narrative: Patient is a 73 y.o. male with PMHx of-not on home O2, DM-2-(diet controlled at home), HTN, CVA, seizure disorder, prostate cancer s/p total prostatectomy, OSA on CPAP-who was transferred from [facility] after he was found to have persistent fever, acute hypoxemic respiratory failure in the setting of COVID-19 pneumonia.  His daughter was recently tested positive for COVID-19.   Subjective:   Patient in bed, appears comfortable, denies any headache, no fever, no chest pain or pressure, no shortness of breath , no abdominal pain. No focal weakness.   Assessment  & Plan :   Acute Hypoxic Resp Failure due to Covid 19 Viral pneumonia with concomitant bacterial pneumonia:   He had developed severe hypoxic respiratory failure and was started on IV steroids and Remdisvir combination, he did not receive Actemra as his procalcitonin levels were elevated.  He is still on 2-4 L of nasal cannula oxygen, unfortunately he has started to run fevers since the evening of 12/14/2018.   Sequently developed possibly mild aspiration pneumonia for which she was treated with antibiotics and he has finished his course.  Speech therapy following and now he seems to be stable on dysphagia 3 diet which will be continued.  He also had mild CHF which was treated with IV Lasix.  Overall much improved on 12/18/2018.     COVID-19 Labs:  Recent Labs    12/16/18 0815 12/17/18 0345 12/18/18 0455   DDIMER 2.12* 1.77* 1.42*  FERRITIN 115  --   --   LDH 452* 432* 392*  CRP 15.2* 15.7* 15.6*    Lab Results  Component Value Date   SARSCOV2NAA NOT DETECTED 10/14/2018   [place] Not Detected 09/30/2018    Hepatic Function Latest Ref Rng & Units 12/18/2018 12/17/2018 12/16/2018  Total Protein 6.5 - 8.1 g/dL  6.1(L) 6.1(L) 6.5  Albumin 3.5 - 5.0 g/dL 2.4(L) 2.5(L) 2.6(L)  AST 15 - 41 U/L _0 ALT 0 - 44 U/L _1 Alk Phosphatase 38 - 126 U/L 73 75 86  Total Bilirubin 0.3 - 1.2 mg/dL 0.5 0.6 0.4    COVID-19 Medications:  Steroids: 8/25>> Remdesivir: 8/25>> Actemra: Not indicated-given concomitant bacterial pneumonia and elevated procalcitonin levels Convalescent Plasma:N/A Research Studies:N/A    Acute on chronic diastolic CHF EF 35% on last echocardiogram in 2017.  Is been diuresed with IV Lasix continue to monitor.  Post discharge outpatient cardiology follow-up.  Fevers.  See #1 above.  Stable bladder scan and UA.  Transaminitis: COVID-19 related.  Has resolved.  OSA - CPAP QHS at home, continue nasal cannula oxygen here.  AKI: Mild likely hemodynamically mediated, continue to monitor.  COPD: No evidence of exacerbation-continue bronchodilators.  HTN: Currently blood pressure stable on Cardizem & NTG paste.  Will monitor and adjust as needed.  PRN hydralazine also available.  Seizure disorder: Continue Dilantin  History of CVA: No focal deficits-continue Plavix  History of 13 x 10 mm pituitary lesion: Follows with neurosurgery at [facility]  History of prostate cancer: Stable for outpatient follow-up with his outpatient physicians  Debility/deconditioning: Secondary to acute illness-appreciate PT/OT-recommendations are for home health services.  OSA: Claims uses CPAP at night-currently due to our facility's policy of no CPAP-we will be on home oxygen.    Obesity: BMI of 32.  Follow with PCP for weight loss.  DM-2 (A1c 6.5): Diet  controlled at home-CBGs stable with SSI.  CBG (last 3)  Recent Labs    12/17/18 1613 12/17/18 2120 12/18/18 0736  GLUCAP 397* 331* 264*    Lab Results  Component Value Date   HGBA1C 6.5 (H) 12/10/2018     Condition - guarded  Family Communication  : Updated wife and daughter in detail on 12/15/2018.  Daughter wants to be updated daily not the wife. Daughter on 8/31, 9/1,  12/18/2018   Code Status :  DNR  Diet :  Diet Order            DIET SOFT Room service appropriate? Yes; Fluid consistency: Thin  Diet effective now               Disposition Plan  :  HHPT in 1-2 days  Consults  :  None  Procedures  :  None  DVT Prophylaxis  :  Lovenox   Antibiotics  :    Antibiotics Given (last 72 hours)    Date/Time Action Medication Dose Rate   12/15/18 1107 New Bag/Given   cefTRIAXone (ROCEPHIN) 2 g in sodium chloride 0.9 % 100 mL IVPB 2 g 200 mL/hr   12/15/18 1430 Given   metroNIDAZOLE (FLAGYL) tablet 500 mg 500 mg    12/15/18 2240 Given   metroNIDAZOLE (FLAGYL) tablet 500 mg 500 mg    12/16/18 0616 Given   metroNIDAZOLE (FLAGYL) tablet 500 mg 500 mg    12/16/18 0840 New Bag/Given   cefTRIAXone (ROCEPHIN) 2 g in sodium chloride 0.9 % 100 mL IVPB 2 g 200 mL/hr   12/16/18 1429 Given   metroNIDAZOLE (FLAGYL) tablet 500 mg 500 mg    12/16/18 2149 Given   metroNIDAZOLE (FLAGYL) tablet 500 mg 500 mg    12/17/18 0631 Given   metroNIDAZOLE (FLAGYL) tablet 500 mg 500 mg    12/17/18 0949 New Bag/Given   cefTRIAXone (ROCEPHIN) 2 g in sodium  chloride 0.9 % 100 mL IVPB 2 g 200 mL/hr   12/17/18 1314 Given   metroNIDAZOLE (FLAGYL) tablet 500 mg 500 mg    12/17/18 2018 Given   metroNIDAZOLE (FLAGYL) tablet 500 mg 500 mg    12/18/18 0558 Given   metroNIDAZOLE (FLAGYL) tablet 500 mg 500 mg      Inpatient Medications  Scheduled Meds: . atorvastatin  10 mg Oral QHS  . benzonatate  200 mg Oral TID  . clopidogrel  75 mg Oral Daily  . diltiazem  120 mg Oral Once  . [START ON  12/19/2018] diltiazem  300 mg Oral Daily  . enoxaparin (LOVENOX) injection  40 mg Subcutaneous Q24H  . escitalopram  20 mg Oral QHS  . hydrochlorothiazide  50 mg Oral Daily  . insulin aspart  0-9 Units Subcutaneous TID WC  . Ipratropium-Albuterol  1 puff Inhalation Q6H  . methylPREDNISolone (SOLU-MEDROL) injection  60 mg Intravenous Q12H  . mometasone-formoterol  2 puff Inhalation BID  . nitroGLYCERIN  1 inch Topical Q6H  . oxybutynin  5 mg Oral TID  . pantoprazole  40 mg Oral Daily  . phenytoin  100 mg Oral Q8H  . topiramate  100 mg Oral BID   Continuous Infusions:  PRN Meds:.acetaminophen, albuterol, hydrALAZINE, metoprolol tartrate, ondansetron (ZOFRAN) IV, polyethylene glycol, sodium chloride flush   Time Spent in minutes  25  See all Orders from today for further details   Lala Lund M.D on 12/18/2018 at 10:28 AM  To page go to [email or web address] - use universal password  [facility] -  Office  [phone]    Objective:   Vitals:   12/17/18 1532 12/17/18 1535 12/18/18 0419 12/18/18 0800  BP:  (!) 100/46  (!) 147/69  Pulse:  90  87  Resp:  (!) 25  (!) 21  Temp: (!) 97.4 F (36.3 C)  97.9 F (36.6 C) 97.6 F (36.4 C)  TempSrc: Oral  Oral Oral  SpO2:  96%  93%  Weight:      Height:        Wt Readings from Last 3 Encounters:  12/10/18 99 kg  11/16/15 100.2 kg    No intake or output data in the 24 hours ending 12/18/18 1028   Physical Exam  Awake Alert, Oriented X 3, No new F.N deficits, Normal affect [place].AT,PERRAL Supple Neck,No JVD, No cervical lymphadenopathy appriciated.  Symmetrical Chest wall movement, Good air movement bilaterally, CTAB RRR,No Gallops, Rubs or new Murmurs, No Parasternal Heave +ve B.Sounds, Abd Soft, No tenderness, No organomegaly appriciated, No rebound - guarding or rigidity. No Cyanosis, Clubbing or edema, No new Rash or bruise    Data Review:    CBC Recent Labs  Lab 12/14/18 0300 12/15/18 0440 12/16/18 0815  12/17/18 0345 12/18/18 0455  WBC 9.7 11.0* 12.2* 11.0* 13.4*  HGB 10.9* 10.6* 12.4* 12.0* 11.9*  HCT 34.6* 34.0* 39.4 38.7* 37.6*  PLT 261 281 420* 428* 451*  MCV 89.4 89.5 89.1 89.4 88.9  MCH 28.2 27.9 28.1 27.7 28.1  MCHC 31.5 31.2 31.5 31.0 31.6  RDW 15.3 15.2 15.3 15.3 15.4  LYMPHSABS 1.0 0.8 1.1 0.8 0.7  MONOABS 0.8 0.8 1.2* 1.1* 0.8  EOSABS 0.0 0.0 0.1 0.1 0.0  BASOSABS 0.0 0.0 0.0 0.0 0.0    Chemistries  Recent Labs  Lab 12/14/18 0300 12/15/18 0440 12/16/18 0815 12/17/18 0345 12/18/18 0455  NA 144 144 142 138 138  K 3.3* 3.8 3.0* 3.7 4.2  CL 110 112* 104 104 102  CO2 _0 GLUCOSE 163* 143* 122* 131* 255*  BUN 30* 27* 21 22 32*  CREATININE 1.31* 1.16 1.28* 1.29* 1.45*  CALCIUM 8.2* 8.3* 8.8* 8.3* 8.0*  MG  --   --  1.9 1.9 1.9  AST 32 _1 ALT 37 _2 ALKPHOS 81 72 86 75 73  BILITOT 0.2* 0.7 0.4 0.6 0.5   ------------------------------------------------------------------------------------------------------------------ No results for input(s): CHOL, HDL, LDLCALC, TRIG, CHOLHDL, LDLDIRECT in the last 72 hours.  Lab Results  Component Value Date   HGBA1C 6.5 (H) 12/10/2018   ------------------------------------------------------------------------------------------------------------------ No results for input(s): TSH, T4TOTAL, T3FREE, THYROIDAB in the last 72 hours.  Invalid input(s): FREET3 ------------------------------------------------------------------------------------------------------------------ Recent Labs    12/16/18 0815  FERRITIN 115    Coagulation profile No results for input(s): INR, PROTIME in the last 168 hours.  Recent Labs    12/17/18 0345 12/18/18 0455  DDIMER 1.77* 1.42*    Cardiac Enzymes No results for input(s): CKMB, TROPONINI, MYOGLOBIN in the last 168 hours.  Invalid input(s): CK ------------------------------------------------------------------------------------------------------------------     Component Value Date/Time   BNP 59.3 12/18/2018 0455    Micro Results Recent Results (from the past 240 hour(s))  Culture, blood (routine x 2)     Status: None (Preliminary result)   Collection Time: 12/14/18  4:30 PM   Specimen: Left Antecubital; Blood  Result Value Ref Range Status   Specimen Description   Final    LEFT ANTECUBITAL Performed at [facility] [address], [place], [place] [address]    Special Requests   Final    BOTTLES DRAWN AEROBIC ONLY Blood Culture adequate volume Performed at [facility] [address], [place], [place] [address]    Culture   Final    NO GROWTH 3 DAYS Performed at [facility], [place] [address], [place], [place] [address]    Report Status PENDING  Incomplete  Culture, blood (routine x 2)     Status: None (Preliminary result)   Collection Time: 12/14/18  4:32 PM   Specimen: BLOOD LEFT HAND  Result Value Ref Range Status   Specimen Description   Final    BLOOD LEFT HAND Performed at [facility] [address] [place], [place] [address]    Special Requests   Final    BOTTLES DRAWN AEROBIC ONLY Blood Culture adequate volume Performed at [facility] [address] [place], [place] [address]    Culture   Final    NO GROWTH 3 DAYS Performed at [facility], [place] [address], [place], [place] [address]    Report Status PENDING  Incomplete  Culture, Urine     Status: None   Collection Time: 12/17/18  5:30 AM   Specimen: Urine, Clean Catch  Result Value Ref Range Status   Specimen Description   Final    URINE, CLEAN CATCH Performed at [facility], [place] [address] [place], [place] [address]    Special Requests   Final    NONE Performed at [facility], [place] [address] [place], [place] [address]    Culture   Final    NO GROWTH Performed at [facility], [place] [address], [place], [place]  [address]    Report Status 12/18/2018 FINAL  Final    Radiology Reports Dg Chest Port 1 View  Result Date: 12/17/2018 CLINICAL DATA:  73 year old male with  history of pneumonia from COVID-19. EXAM: PORTABLE CHEST 1 VIEW COMPARISON:  Chest x-ray 12/15/2018. FINDINGS: Lung volumes are low. Patchy multifocal airspace consolidation throughout the lungs bilaterally, asymmetrically distributed (right greater than left) with relative sparing of the left upper lobe. Overall, aeration continues to worsened compared to the prior study. No evidence of pulmonary edema. Heart size is mildly enlarged. Upper mediastinal contours are unremarkable. IMPRESSION: 1. Worsening multilobar bilateral pneumonia, as above. 2. Mild cardiomegaly. Electronically Signed   By: Vinnie Langton M.D.   On: 12/17/2018 08:53   Dg Chest Port 1 View  Result Date: 12/15/2018 CLINICAL DATA:  Dyspnea EXAM: PORTABLE CHEST 1 VIEW COMPARISON:  Chest radiograph from one day prior. FINDINGS: Stable cardiomediastinal silhouette with normal heart size. No pneumothorax. No pleural effusion. Patchy opacities throughout both lungs, with relative sparing of the upper left lung, not substantially changed. IMPRESSION: No substantial change in extensive patchy bilateral lung opacities compatible with multilobar pneumonia. Electronically Signed   By: Ilona Sorrel M.D.   On: 12/15/2018 10:11   Dg Chest Port 1 View  Result Date: 12/10/2018 CLINICAL DATA:  Pneumonia due to COVID-19 EXAM: PORTABLE CHEST 1 VIEW COMPARISON:  Portable exam 0620 hours compared to 12/09/2018 FINDINGS: Enlargement of cardiac silhouette. Tortuous aorta. Pulmonary vascularity normal. BILATERAL pulmonary infiltrates again seen, little changed. No pleural effusion or pneumothorax. Bones unremarkable. IMPRESSION: Persistent pulmonary infiltrates bilaterally. Electronically Signed   By: Lavonia Dana M.D.   On: 12/10/2018 08:59   Dg Chest Port 1v Same Day  Result Date: 12/14/2018 CLINICAL  DATA:  Shortness of breath EXAM: PORTABLE CHEST 1 VIEW COMPARISON:  December 10, 2018 FINDINGS: No pneumothorax. There is opacity diffusely throughout the right lung which is worsened. There is opacity in left base which has worsened. No other interval changes. IMPRESSION: Worsening bilateral pulmonary opacities/infiltrates, diffusely distributed on the right and localize in the left base on the left. This may represent an infectious process. Asymmetric pulmonary edema is considered less likely but possible. Electronically Signed   By: Dorise Bullion III M.D   On: 12/14/2018 10:52

## 2018-12-19 LAB — C DIFFICILE QUICK SCREEN W PCR REFLEX
C Diff antigen: NEGATIVE
C Diff interpretation: NOT DETECTED
C Diff toxin: NEGATIVE

## 2018-12-19 LAB — GLUCOSE, CAPILLARY
Glucose-Capillary: 244 mg/dL — ABNORMAL HIGH (ref 70–99)
Glucose-Capillary: 342 mg/dL — ABNORMAL HIGH (ref 70–99)
Glucose-Capillary: 364 mg/dL — ABNORMAL HIGH (ref 70–99)
Glucose-Capillary: 229 mg/dL — ABNORMAL HIGH (ref 70–99)

## 2018-12-19 LAB — CBC WITH DIFFERENTIAL/PLATELET
Abs Immature Granulocytes: 0.37 10*3/uL — ABNORMAL HIGH (ref 0.00–0.07)
Basophils Absolute: 0.1 10*3/uL (ref 0.0–0.1)
Basophils Relative: 0 %
Eosinophils Absolute: 0 10*3/uL (ref 0.0–0.5)
Eosinophils Relative: 0 %
HCT: 37.3 % — ABNORMAL LOW (ref 39.0–52.0)
Hemoglobin: 11.6 g/dL — ABNORMAL LOW (ref 13.0–17.0)
Immature Granulocytes: 2 %
Lymphocytes Relative: 4 %
Lymphs Abs: 0.8 10*3/uL (ref 0.7–4.0)
MCH: 28 pg (ref 26.0–34.0)
MCHC: 31.1 g/dL (ref 30.0–36.0)
MCV: 90.1 fL (ref 80.0–100.0)
Monocytes Absolute: 1 10*3/uL (ref 0.1–1.0)
Monocytes Relative: 5 %
Neutro Abs: 17.7 10*3/uL — ABNORMAL HIGH (ref 1.7–7.7)
Neutrophils Relative %: 89 %
Platelets: 557 10*3/uL — ABNORMAL HIGH (ref 150–400)
RBC: 4.14 MIL/uL — ABNORMAL LOW (ref 4.22–5.81)
RDW: 15.4 % (ref 11.5–15.5)
WBC: 20 10*3/uL — ABNORMAL HIGH (ref 4.0–10.5)
nRBC: 0.5 % — ABNORMAL HIGH (ref 0.0–0.2)

## 2018-12-19 LAB — COMPREHENSIVE METABOLIC PANEL
ALT: 21 U/L (ref 0–44)
AST: 22 U/L (ref 15–41)
Albumin: 2.6 g/dL — ABNORMAL LOW (ref 3.5–5.0)
Alkaline Phosphatase: 81 U/L (ref 38–126)
Anion gap: 13 (ref 5–15)
BUN: 42 mg/dL — ABNORMAL HIGH (ref 8–23)
CO2: 27 mmol/L (ref 22–32)
Calcium: 8.3 mg/dL — ABNORMAL LOW (ref 8.9–10.3)
Chloride: 99 mmol/L (ref 98–111)
Creatinine, Ser: 1.46 mg/dL — ABNORMAL HIGH (ref 0.61–1.24)
GFR calc Af Amer: 55 mL/min — ABNORMAL LOW (ref >60)
GFR calc non Af Amer: 47 mL/min — ABNORMAL LOW (ref >60)
Glucose, Bld: 151 mg/dL — ABNORMAL HIGH (ref 70–99)
Potassium: 3.8 mmol/L (ref 3.5–5.1)
Sodium: 139 mmol/L (ref 135–145)
Total Bilirubin: 0.5 mg/dL (ref 0.3–1.2)
Total Protein: 6.3 g/dL — ABNORMAL LOW (ref 6.5–8.1)

## 2018-12-19 LAB — CULTURE, BLOOD (ROUTINE X 2)
Culture: NO GROWTH
Culture: NO GROWTH
Special Requests: ADEQUATE
Special Requests: ADEQUATE

## 2018-12-19 LAB — C-REACTIVE PROTEIN: CRP: 9.4 mg/dL — ABNORMAL HIGH (ref <1.0)

## 2018-12-19 LAB — BRAIN NATRIURETIC PEPTIDE: B Natriuretic Peptide: 56.5 pg/mL (ref 0.0–100.0)

## 2018-12-19 LAB — MAGNESIUM: Magnesium: 2.2 mg/dL (ref 1.7–2.4)

## 2018-12-19 LAB — FERRITIN: Ferritin: 128 ng/mL (ref 24–336)

## 2018-12-19 LAB — D-DIMER, QUANTITATIVE: D-Dimer, Quant: 1.81 ug/mL-FEU — ABNORMAL HIGH (ref 0.00–0.50)

## 2018-12-19 MED ORDER — ISOSORBIDE MONONITRATE ER 30 MG PO TB24
30.0000 mg | ORAL_TABLET | Freq: Every day | ORAL | Status: DC
  Administered 2018-12-19 – 2018-12-22 (×4): 30 mg via ORAL
  Filled 2018-12-19 (×4): qty 1

## 2018-12-19 NOTE — Progress Notes (Signed)
PROGRESS NOTE                                                                                                                                                                                                             Patient Demographics:    Derrick Bender, is a 72 y.o. male, DOB - [birth year], ZOX:[id number]  Outpatient Primary MD for the patient is Glenda Chroman, MD   Admit date - 12/09/2018   LOS - 10  No chief complaint on file.      Brief Narrative: Patient is a 73 y.o. male with PMHx of-not on home O2, DM-2-(diet controlled at home), HTN, CVA, seizure disorder, prostate cancer s/p total prostatectomy, OSA on CPAP-who was transferred from [facility] after he was found to have persistent fever, acute hypoxemic respiratory failure in the setting of COVID-19 pneumonia.  His daughter was recently tested positive for COVID-19.   Subjective:   Patient in bed, appears comfortable, denies any headache, no fever, no chest pain or pressure, no shortness of breath , no abdominal pain. No focal weakness.    Assessment  & Plan :   Acute Hypoxic Resp Failure due to Covid 19 Viral pneumonia with concomitant bacterial pneumonia:   He had developed severe hypoxic respiratory failure and was started on IV steroids and Remdisvir combination, he did not receive Actemra as his procalcitonin levels were elevated.  He is still on 2-4 L of nasal cannula oxygen, unfortunately he has started to run fevers since the evening of 12/14/2018.   Subsequently he developed possibly mild aspiration pneumonia for which he was treated with antibiotics and he has finished his 5 day course.  Speech therapy following and now he seems to be stable on dysphagia 3 diet which will be continued.  He also had mild CHF which was treated with IV Lasix.  Overall much improved on 12/18/2018, remains better from pulmonary standpoint on 12/19/2018.     COVID-19 Labs:   Recent Labs    12/17/18 0345 12/18/18 0455 12/19/18 0120  DDIMER 1.77* 1.42* 1.81*  FERRITIN  --   --  128  LDH 432* 392*  --   CRP 15.7* 15.6* 9.4*    Lab Results  Component Value Date   SARSCOV2NAA NOT DETECTED 10/14/2018   [place] Not Detected 09/30/2018    Hepatic Function Latest Ref  Rng & Units 12/19/2018 12/18/2018 12/17/2018  Total Protein 6.5 - 8.1 g/dL 6.3(L) 6.1(L) 6.1(L)  Albumin 3.5 - 5.0 g/dL 2.6(L) 2.4(L) 2.5(L)  AST 15 - 41 U/L 22 18 23   ALT 0 - 44 U/L 21 22 23   Alk Phosphatase 38 - 126 U/L 81 73 75  Total Bilirubin 0.3 - 1.2 mg/dL 0.5 0.5 0.6    COVID-19 Medications:  Steroids: 8/25>> Remdesivir: 8/25>> Actemra: Not indicated-given concomitant bacterial pneumonia and elevated procalcitonin levels Convalescent Plasma:N/A Research Studies:N/A     Diarrhea with leukocytosis.  Recent exposure to antibiotics.  Rule out C. difficile immediately.    Acute on chronic diastolic CHF EF 36% on last echocardiogram in 2017.  Is been diuresed with IV Lasix continue to monitor.  Post discharge outpatient cardiology follow-up.  Fevers.  See #1 above.  Stable bladder scan and UA.  Transaminitis: COVID-19 related.  Has resolved.  OSA - CPAP QHS at home, continue nasal cannula oxygen here.  AKI: Mild likely hemodynamically mediated, continue to monitor.  COPD: No evidence of exacerbation-continue bronchodilators.  HTN: Currently blood pressure stable on Cardizem & NTG paste.  Will monitor and adjust as needed.  PRN hydralazine also available.  Seizure disorder: Continue Dilantin  History of CVA: No focal deficits-continue Plavix  History of 13 x 10 mm pituitary lesion: Follows with neurosurgery at [facility]  History of prostate cancer: Stable for outpatient follow-up with his outpatient physicians  Debility/deconditioning: Secondary to acute illness-appreciate PT/OT-recommendations are for home health services.  OSA: Claims uses CPAP at  night-currently due to our facility's policy of no CPAP-we will be on home oxygen.    Obesity: BMI of 32.  Follow with PCP for weight loss.  DM-2 (A1c 6.5): Diet controlled at home-CBGs stable with SSI.  CBG (last 3)  Recent Labs    12/18/18 1550 12/18/18 2117 12/19/18 0722  GLUCAP 302* 193* 244*    Lab Results  Component Value Date   HGBA1C 6.5 (H) 12/10/2018     Condition - guarded  Family Communication  : Updated wife and daughter in detail on 12/15/2018.  Daughter wants to be updated daily not the wife. Daughter on 8/31, 9/1,  12/18/2018   Code Status :  DNR  Diet :  Diet Order            DIET SOFT Room service appropriate? Yes; Fluid consistency: Thin  Diet effective now               Disposition Plan  :  HHPT once better, home health ordered and DME equipment ordered.  Consults  :  None  Procedures  :  None  DVT Prophylaxis  :  Lovenox   Antibiotics  :    Anti-infectives (From admission, onward)   Start     Dose/Rate Route Frequency Ordered Stop   12/15/18 1000  cefTRIAXone (ROCEPHIN) 1 g in sodium chloride 0.9 % 100 mL IVPB  Status:  Discontinued    Note to Pharmacy: [facility] listed Keflex as an allergy, but it is not listed as significant in our system. The patient was on Levofloxacin and azithromycin at sending facility.   1 g 200 mL/hr over 30 Minutes Intravenous Every 24 hours 12/14/18 1202 12/14/18 1339   12/15/18 1000  cefTRIAXone (ROCEPHIN) 2 g in sodium chloride 0.9 % 100 mL IVPB    Note to Pharmacy: [facility] listed Keflex as an allergy, but it is not listed as significant in our system. The patient was  on Levofloxacin and azithromycin at sending facility.   2 g 200 mL/hr over 30 Minutes Intravenous Every 24 hours 12/14/18 1356 12/17/18 1019   12/14/18 1400  metroNIDAZOLE (FLAGYL) tablet 500 mg     500 mg Oral Every 8 hours 12/14/18 1356 12/18/18 0558   12/11/18 1000  remdesivir 100 mg in sodium chloride 0.9 % 250 mL IVPB     100 mg 500 mL/hr over  30 Minutes Intravenous Every 24 hours 12/10/18 0810 12/14/18 1945   12/10/18 1000  cefTRIAXone (ROCEPHIN) 2 g in sodium chloride 0.9 % 100 mL IVPB    Note to Pharmacy: [facility] listed Keflex as an allergy, but it is not listed as significant in our system. The patient was on Levofloxacin and azithromycin at sending facility.   2 g 200 mL/hr over 30 Minutes Intravenous Every 24 hours 12/10/18 0548 12/14/18 1000   12/10/18 1000  azithromycin (ZITHROMAX) tablet 500 mg     500 mg Oral Daily 12/10/18 0548 12/14/18 0924   12/10/18 1000  remdesivir 200 mg in sodium chloride 0.9 % 250 mL IVPB     200 mg 500 mL/hr over 30 Minutes Intravenous Once 12/10/18 0810 12/10/18 1223     Inpatient Medications  Scheduled Meds: . atorvastatin  10 mg Oral QHS  . benzonatate  200 mg Oral TID  . clopidogrel  75 mg Oral Daily  . diltiazem  300 mg Oral Daily  . enoxaparin (LOVENOX) injection  40 mg Subcutaneous Q24H  . escitalopram  20 mg Oral QHS  . insulin aspart  0-15 Units Subcutaneous TID WC  . insulin aspart  0-5 Units Subcutaneous QHS  . insulin aspart  4 Units Subcutaneous TID WC  . insulin glargine  25 Units Subcutaneous Daily  . Ipratropium-Albuterol  1 puff Inhalation Q6H  . methylPREDNISolone (SOLU-MEDROL) injection  60 mg Intravenous Q12H  . mometasone-formoterol  2 puff Inhalation BID  . nitroGLYCERIN  0.5 inch Topical Q6H  . oxybutynin  5 mg Oral TID  . pantoprazole  40 mg Oral Daily  . phenytoin  100 mg Oral Q8H  . topiramate  100 mg Oral BID   Continuous Infusions:  PRN Meds:.acetaminophen, albuterol, hydrALAZINE, metoprolol tartrate, ondansetron (ZOFRAN) IV, polyethylene glycol, sodium chloride flush   Time Spent in minutes  25  See all Orders from today for further details   Lala Lund M.D on 12/19/2018 at 9:30 AM  To page go to [email or web address] - use universal password  [facility] -  Office  [phone]    Objective:   Vitals:   12/18/18 1600 12/18/18 2047 12/19/18  0314 12/19/18 0800  BP: 124/74 133/77 118/76   Pulse:   90 87  Resp: (!) 29 (!) 22 (!) 25   Temp:  98 F (36.7 C) 98.3 F (36.8 C) 97.6 F (36.4 C)  TempSrc:  Oral Oral Oral  SpO2: 98%     Weight:      Height:        Wt Readings from Last 3 Encounters:  12/10/18 99 kg  11/16/15 100.2 kg     Intake/Output Summary (Last 24 hours) at 12/19/2018 0930 Last data filed at 12/18/2018 2130 Gross per 24 hour  Intake 720 ml  Output 600 ml  Net 120 ml     Physical Exam  Awake Alert, Oriented X 3, No new F.N deficits, Normal affect [place].AT,PERRAL Supple Neck,No JVD, No cervical lymphadenopathy appriciated.  Symmetrical Chest wall movement, Good air movement bilaterally, CTAB RRR,No Gallops, Rubs or  new Murmurs, No Parasternal Heave +ve B.Sounds, Abd Soft, No tenderness, No organomegaly appriciated, No rebound - guarding or rigidity. No Cyanosis, Clubbing or edema, No new Rash or bruise    Data Review:    CBC Recent Labs  Lab 12/15/18 0440 12/16/18 0815 12/17/18 0345 12/18/18 0455 12/19/18 0120  WBC 11.0* 12.2* 11.0* 13.4* 20.0*  HGB 10.6* 12.4* 12.0* 11.9* 11.6*  HCT 34.0* 39.4 38.7* 37.6* 37.3*  PLT 281 420* 428* 451* 557*  MCV 89.5 89.1 89.4 88.9 90.1  MCH 27.9 28.1 27.7 28.1 28.0  MCHC 31.2 31.5 31.0 31.6 31.1  RDW 15.2 15.3 15.3 15.4 15.4  LYMPHSABS 0.8 1.1 0.8 0.7 0.8  MONOABS 0.8 1.2* 1.1* 0.8 1.0  EOSABS 0.0 0.1 0.1 0.0 0.0  BASOSABS 0.0 0.0 0.0 0.0 0.1    Chemistries  Recent Labs  Lab 12/15/18 0440 12/16/18 0815 12/17/18 0345 12/18/18 0455 12/19/18 0120  NA 144 142 138 138 139  K 3.8 3.0* 3.7 4.2 3.8  CL 112* 104 104 102 99  CO2 25 26 22 25 27   GLUCOSE 143* 122* 131* 255* 151*  BUN 27* 21 22 32* 42*  CREATININE 1.16 1.28* 1.29* 1.45* 1.46*  CALCIUM 8.3* 8.8* 8.3* 8.0* 8.3*  MG  --  1.9 1.9 1.9 2.2  AST 22 25 23 18 22   ALT 29 27 23 22 21   ALKPHOS 72 86 75 73 81  BILITOT 0.7 0.4 0.6 0.5 0.5    ------------------------------------------------------------------------------------------------------------------ No results for input(s): CHOL, HDL, LDLCALC, TRIG, CHOLHDL, LDLDIRECT in the last 72 hours.  Lab Results  Component Value Date   HGBA1C 6.5 (H) 12/10/2018   ------------------------------------------------------------------------------------------------------------------ No results for input(s): TSH, T4TOTAL, T3FREE, THYROIDAB in the last 72 hours.  Invalid input(s): FREET3 ------------------------------------------------------------------------------------------------------------------ Recent Labs    12/19/18 0120  FERRITIN 128    Coagulation profile No results for input(s): INR, PROTIME in the last 168 hours.  Recent Labs    12/18/18 0455 12/19/18 0120  DDIMER 1.42* 1.81*    Cardiac Enzymes No results for input(s): CKMB, TROPONINI, MYOGLOBIN in the last 168 hours.  Invalid input(s): CK ------------------------------------------------------------------------------------------------------------------    Component Value Date/Time   BNP 56.5 12/19/2018 0120    Micro Results Recent Results (from the past 240 hour(s))  Culture, blood (routine x 2)     Status: None (Preliminary result)   Collection Time: 12/14/18  4:30 PM   Specimen: Left Antecubital; Blood  Result Value Ref Range Status   Specimen Description   Final    LEFT ANTECUBITAL Performed at [facility] [address], [place], [place] [address]    Special Requests   Final    BOTTLES DRAWN AEROBIC ONLY Blood Culture adequate volume Performed at [facility] [address] [place], [place] [address]    Culture   Final    NO GROWTH 4 DAYS Performed at [facility], [place] [address] [place], [place] [address]    Report Status PENDING  Incomplete  Culture, blood (routine x 2)     Status: None (Preliminary result)   Collection Time: 12/14/18  4:32 PM    Specimen: BLOOD LEFT HAND  Result Value Ref Range Status   Specimen Description   Final    BLOOD LEFT HAND Performed at [facility] [address], [place],  [address]    Special Requests   Final    BOTTLES DRAWN AEROBIC ONLY Blood Culture adequate volume Performed at [facility],  [place] [address], [place], [place] [address]    Culture   Final    NO GROWTH 4 DAYS Performed at [facility], [place] [address] [place], [place] [address]    Report Status PENDING  Incomplete  Culture, Urine     Status: None   Collection Time: 12/17/18  5:30 AM   Specimen: Urine, Clean Catch  Result Value Ref Range Status   Specimen Description   Final    URINE, CLEAN CATCH Performed at [facility], [place] [address], [place], [place] [address]    Special Requests   Final    NONE Performed at [facility], [place] [address], [place], [place] [address]    Culture   Final    NO GROWTH Performed at [facility], [place] [address], [place],  [address]    Report Status 12/18/2018 FINAL  Final    Radiology Reports Dg Chest Port 1 View  Result Date: 12/17/2018 CLINICAL DATA:  73 year old male with history of pneumonia from COVID-19. EXAM: PORTABLE CHEST 1 VIEW COMPARISON:  Chest x-ray 12/15/2018. FINDINGS: Lung volumes are low. Patchy multifocal airspace consolidation throughout the lungs bilaterally, asymmetrically distributed (right greater than left) with relative sparing of the left upper lobe. Overall, aeration continues to worsened compared to the prior study. No evidence of pulmonary edema. Heart size is mildly enlarged. Upper mediastinal contours are unremarkable. IMPRESSION: 1. Worsening multilobar bilateral pneumonia, as above. 2. Mild cardiomegaly. Electronically Signed   By: Vinnie Langton M.D.   On: 12/17/2018 08:53   Dg Chest Port 1 View  Result Date: 12/15/2018 CLINICAL DATA:  Dyspnea EXAM:  PORTABLE CHEST 1 VIEW COMPARISON:  Chest radiograph from one day prior. FINDINGS: Stable cardiomediastinal silhouette with normal heart size. No pneumothorax. No pleural effusion. Patchy opacities throughout both lungs, with relative sparing of the upper left lung, not substantially changed. IMPRESSION: No substantial change in extensive patchy bilateral lung opacities compatible with multilobar pneumonia. Electronically Signed   By: Ilona Sorrel M.D.   On: 12/15/2018 10:11   Dg Chest Port 1 View  Result Date: 12/10/2018 CLINICAL DATA:  Pneumonia due to COVID-19 EXAM: PORTABLE CHEST 1 VIEW COMPARISON:  Portable exam 0620 hours compared to 12/09/2018 FINDINGS: Enlargement of cardiac silhouette. Tortuous aorta. Pulmonary vascularity normal. BILATERAL pulmonary infiltrates again seen, little changed. No pleural effusion or pneumothorax. Bones unremarkable. IMPRESSION: Persistent pulmonary infiltrates bilaterally. Electronically Signed   By: Lavonia Dana M.D.   On: 12/10/2018 08:59   Dg Chest Port 1v Same Day  Result Date: 12/14/2018 CLINICAL DATA:  Shortness of breath EXAM: PORTABLE CHEST 1 VIEW COMPARISON:  December 10, 2018 FINDINGS: No pneumothorax. There is opacity diffusely throughout the right lung which is worsened. There is opacity in left base which has worsened. No other interval changes. IMPRESSION: Worsening bilateral pulmonary opacities/infiltrates, diffusely distributed on the right and localize in the left base on the left. This may represent an infectious process. Asymmetric pulmonary edema is considered less likely but possible. Electronically Signed   By: Dorise Bullion III M.D   On: 12/14/2018 10:52

## 2018-12-19 NOTE — Progress Notes (Signed)
Physical Therapy Treatment Patient Details Name: Derrick Bender MRN: [id number] DOB: 1946-04-04 Today's Date: 12/19/2018    History of Present Illness Patient is a 73 y.o. male with PMHx including, DM-2, HTN, CVA, seizure disorder, prostate cancer s/p total prostatectomy. Presented to ED with persistent fever, acute hypoxemic respiratory failure (not typically on home O2) in the setting of COVID-19 pneumonia.    PT Comments    Patient groggy from being awakened (asleep in recliner). RN requested assistance to get patient to [facility] and pt thought he had not yet had a BM. Upon standing, there was BM in the chair. Patient slow to follow commands/instructions for safe transfer. Transfer to stand better from [facility], however continued to require min assist and max cues for safety (to keep RW from tipping). Patient with shuffling steps to pivot to recliner with assist to maneuver RW. Discharge plan updated below.  During transfer, pt's RR incr 41 with sats decr at least to 87% (pleth initially not reading). Required incr time and demonstration of breathing technique to help him slow his breath down and take deeper breaths.      Follow Up Recommendations  Supervision/Assistance - 24 hour;SNF     Equipment Recommendations  Rolling walker with 5" wheels;3in1 (PT)(if refuses SNF)    Recommendations for Other Services       Precautions / Restrictions Precautions Precautions: Fall Precaution Comments: monitor sats Restrictions Weight Bearing Restrictions: No    Mobility  Bed Mobility                  Transfers Overall transfer level: Needs assistance Equipment used: Rolling walker (2 wheeled);1 person hand held assist Transfers: Sit to/from Stand;Stand Pivot Transfers Sit to Stand: Min assist Stand pivot transfers: Min assist       General transfer comment: vc for safe use of RW/hand placement to prevent tipping; increased processing time despite urgency to get to  [facility]  Ambulation/Gait             General Gait Details: pt incontinent of bowels and groggy, therefore not safe to ambulate; did stand for 2 minutes with minguard assist for [employer]             Wheelchair Mobility    Modified Rankin (Stroke Patients Only)       Balance Overall balance assessment: Needs assistance Sitting-balance support: No upper extremity supported Sitting balance-Leahy Scale: Good Sitting balance - Comments: edge of chair   Standing balance support: Bilateral upper extremity supported;During functional activity Standing balance-Leahy Scale: Poor Standing balance comment: reliant on external support                            Cognition Arousal/Alertness: Lethargic(had been sleeping minutes prior to arrival) Behavior During Therapy: Flat affect Overall Cognitive Status: Impaired/Different from baseline Area of Impairment: Attention;Memory;Safety/judgement;Awareness;Problem solving                 Orientation Level: Disoriented to;Time Current Attention Level: Sustained Memory: Decreased short-term memory Following Commands: Follows one step commands consistently;Follows one step commands with increased time Safety/Judgement: Decreased awareness of safety;Decreased awareness of deficits Awareness: Emergent Problem Solving: Slow processing;Requires verbal cues;Decreased initiation;Difficulty sequencing General Comments: Patient stated needed to have a BM and that he had not yet gone; however when he stood up, he had already been incontinent      Exercises      General Comments General comments (skin integrity, edema, etc.):  RN reported she has already cleaned pt of BM 10x today; she plans to call daughter to discuss reasons he is not safe to discharge home      Pertinent Vitals/Pain Pain Assessment: Faces Faces Pain Scale: Hurts even more Pain Location: hemorrhoids Pain Descriptors / Indicators:  Grimacing;Sharp;Tender(as RN cleaning him) Pain Intervention(s): Monitored during session    Home Living                      Prior Function            PT Goals (current goals can now be found in the care plan section) Acute Rehab PT Goals Patient Stated Goal: wants to go home Time For Goal Achievement: 12/24/18 Potential to Achieve Goals: Good Progress towards PT goals: Not progressing toward goals - comment(limited by incontinence of bowels)    Frequency    Min 3X/week(family has not yet agreed to SNF)      PT Plan Discharge plan needs to be updated    Co-evaluation              AM-PAC PT "6 Clicks" Mobility   Outcome Measure  Help needed turning from your back to your side while in a flat bed without using bedrails?: None Help needed moving from lying on your back to sitting on the side of a flat bed without using bedrails?: A Little Help needed moving to and from a bed to a chair (including a wheelchair)?: A Little Help needed standing up from a chair using your arms (e.g., wheelchair or bedside chair)?: A Little Help needed to walk in hospital room?: A Little Help needed climbing 3-5 steps with a railing? : A Lot 6 Click Score: 18    End of Session Equipment Utilized During Treatment: Oxygen;Gait belt Activity Tolerance: Patient limited by fatigue;Patient limited by lethargy(confused) Patient left: with call bell/phone within reach;in chair;with chair alarm set;with nursing/sitter in room Nurse Communication: (RN present to assist) PT Visit Diagnosis: Difficulty in walking, not elsewhere classified (R26.2);Unsteadiness on feet (R26.81);Muscle weakness (generalized) (M62.81)     Time: [id number] PT Time Calculation (min) (ACUTE ONLY): 27 min  Charges:  $Therapeutic Activity: 23-37 mins                       Barry Brunner, PT       Derrick Bender 12/19/2018, 2:05 PM

## 2018-12-20 LAB — FERRITIN: Ferritin: 104 ng/mL (ref 24–336)

## 2018-12-20 LAB — CBC WITH DIFFERENTIAL/PLATELET
Abs Immature Granulocytes: 0.39 10*3/uL — ABNORMAL HIGH (ref 0.00–0.07)
Basophils Absolute: 0 10*3/uL (ref 0.0–0.1)
Basophils Relative: 0 %
Eosinophils Absolute: 0 10*3/uL (ref 0.0–0.5)
Eosinophils Relative: 0 %
HCT: 35.8 % — ABNORMAL LOW (ref 39.0–52.0)
Hemoglobin: 11.2 g/dL — ABNORMAL LOW (ref 13.0–17.0)
Immature Granulocytes: 2 %
Lymphocytes Relative: 4 %
Lymphs Abs: 0.8 10*3/uL (ref 0.7–4.0)
MCH: 28.1 pg (ref 26.0–34.0)
MCHC: 31.3 g/dL (ref 30.0–36.0)
MCV: 89.7 fL (ref 80.0–100.0)
Monocytes Absolute: 1.2 10*3/uL — ABNORMAL HIGH (ref 0.1–1.0)
Monocytes Relative: 6 %
Neutro Abs: 17.7 10*3/uL — ABNORMAL HIGH (ref 1.7–7.7)
Neutrophils Relative %: 88 %
Platelets: 512 10*3/uL — ABNORMAL HIGH (ref 150–400)
RBC: 3.99 MIL/uL — ABNORMAL LOW (ref 4.22–5.81)
RDW: 15.2 % (ref 11.5–15.5)
WBC: 20.1 10*3/uL — ABNORMAL HIGH (ref 4.0–10.5)
nRBC: 0.2 % (ref 0.0–0.2)

## 2018-12-20 LAB — COMPREHENSIVE METABOLIC PANEL
ALT: 19 U/L (ref 0–44)
AST: 18 U/L (ref 15–41)
Albumin: 2.5 g/dL — ABNORMAL LOW (ref 3.5–5.0)
Alkaline Phosphatase: 72 U/L (ref 38–126)
Anion gap: 12 (ref 5–15)
BUN: 53 mg/dL — ABNORMAL HIGH (ref 8–23)
CO2: 26 mmol/L (ref 22–32)
Calcium: 8.3 mg/dL — ABNORMAL LOW (ref 8.9–10.3)
Chloride: 103 mmol/L (ref 98–111)
Creatinine, Ser: 1.61 mg/dL — ABNORMAL HIGH (ref 0.61–1.24)
GFR calc Af Amer: 48 mL/min — ABNORMAL LOW (ref >60)
GFR calc non Af Amer: 42 mL/min — ABNORMAL LOW (ref >60)
Glucose, Bld: 124 mg/dL — ABNORMAL HIGH (ref 70–99)
Potassium: 3.4 mmol/L — ABNORMAL LOW (ref 3.5–5.1)
Sodium: 141 mmol/L (ref 135–145)
Total Bilirubin: 0.4 mg/dL (ref 0.3–1.2)
Total Protein: 5.9 g/dL — ABNORMAL LOW (ref 6.5–8.1)

## 2018-12-20 LAB — GLUCOSE, CAPILLARY
Glucose-Capillary: 193 mg/dL — ABNORMAL HIGH (ref 70–99)
Glucose-Capillary: 357 mg/dL — ABNORMAL HIGH (ref 70–99)
Glucose-Capillary: 247 mg/dL — ABNORMAL HIGH (ref 70–99)
Glucose-Capillary: 179 mg/dL — ABNORMAL HIGH (ref 70–99)

## 2018-12-20 LAB — D-DIMER, QUANTITATIVE: D-Dimer, Quant: 1.83 ug/mL-FEU — ABNORMAL HIGH (ref 0.00–0.50)

## 2018-12-20 LAB — C-REACTIVE PROTEIN: CRP: 3.4 mg/dL — ABNORMAL HIGH (ref <1.0)

## 2018-12-20 LAB — BRAIN NATRIURETIC PEPTIDE: B Natriuretic Peptide: 49.5 pg/mL (ref 0.0–100.0)

## 2018-12-20 LAB — MAGNESIUM: Magnesium: 2.5 mg/dL — ABNORMAL HIGH (ref 1.7–2.4)

## 2018-12-20 MED ORDER — POTASSIUM CHLORIDE 2 MEQ/ML IV SOLN
INTRAVENOUS | Status: DC
  Filled 2018-12-20 (×2): qty 500

## 2018-12-20 MED ORDER — INSULIN GLARGINE 100 UNIT/ML ~~LOC~~ SOLN
15.0000 [IU] | Freq: Every day | SUBCUTANEOUS | Status: DC
  Administered 2018-12-20 – 2018-12-22 (×3): 15 [IU] via SUBCUTANEOUS
  Filled 2018-12-20 (×3): qty 0.15

## 2018-12-20 MED ORDER — LOPERAMIDE HCL 2 MG PO CAPS
2.0000 mg | ORAL_CAPSULE | Freq: Two times a day (BID) | ORAL | Status: DC | PRN
  Administered 2018-12-20: 2 mg via ORAL
  Filled 2018-12-20: qty 1

## 2018-12-20 MED ORDER — ENSURE ENLIVE PO LIQD
237.0000 mL | Freq: Two times a day (BID) | ORAL | Status: DC
  Administered 2018-12-20 – 2018-12-22 (×5): 237 mL via ORAL

## 2018-12-20 MED ORDER — METHYLPREDNISOLONE SODIUM SUCC 40 MG IJ SOLR
40.0000 mg | Freq: Every day | INTRAMUSCULAR | Status: DC
  Administered 2018-12-20 – 2018-12-22 (×3): 40 mg via INTRAVENOUS
  Filled 2018-12-20 (×3): qty 1

## 2018-12-20 MED ORDER — LACTATED RINGERS IV SOLN
INTRAVENOUS | Status: DC
  Administered 2018-12-20: 16:00:00 via INTRAVENOUS

## 2018-12-20 MED ORDER — POTASSIUM CHLORIDE CRYS ER 20 MEQ PO TBCR
20.0000 meq | EXTENDED_RELEASE_TABLET | Freq: Once | ORAL | Status: AC
  Administered 2018-12-20: 20 meq via ORAL
  Filled 2018-12-20: qty 1

## 2018-12-20 MED ORDER — POTASSIUM CHLORIDE 2 MEQ/ML IV SOLN
INTRAVENOUS | Status: AC
  Administered 2018-12-20: 10:00:00 via INTRAVENOUS
  Filled 2018-12-20: qty 1000

## 2018-12-20 MED ORDER — HYDROCORTISONE 1 % EX CREA
TOPICAL_CREAM | Freq: Three times a day (TID) | CUTANEOUS | Status: DC
  Administered 2018-12-20 (×2): via TOPICAL
  Administered 2018-12-21: 1 via TOPICAL
  Administered 2018-12-21 – 2018-12-22 (×3): via TOPICAL
  Filled 2018-12-20: qty 28

## 2018-12-20 NOTE — Progress Notes (Signed)
PROGRESS NOTE                                                                                                                                                                                                             Patient Demographics:    Derrick Bender, is a 73 y.o. male, DOB - [birth year], OIN:[id number]  Outpatient Primary MD for the patient is Glenda Chroman, MD   Admit date - 12/09/2018   LOS - 11  No chief complaint on file.      Brief Narrative: Patient is a 73 y.o. male with PMHx of-not on home O2, DM-2-(diet controlled at home), HTN, CVA, seizure disorder, prostate cancer s/p total prostatectomy, OSA on CPAP-who was transferred from [facility] after he was found to have persistent fever, acute hypoxemic respiratory failure in the setting of COVID-19 pneumonia.  His daughter was recently tested positive for COVID-19.   Subjective:   Patient in bed, appears comfortable, denies any headache, no fever, no chest pain or pressure, no shortness of breath , no abdominal pain. No focal weakness.   Assessment  & Plan :   Acute Hypoxic Resp Failure due to Covid 19 Viral pneumonia with concomitant bacterial pneumonia:   He had developed severe hypoxic respiratory failure and was started on IV steroids and Remdisvir combination, he did not receive Actemra as his procalcitonin levels were elevated.  He is still on 2-4 L of nasal cannula oxygen, unfortunately he has started to run fevers since the evening of 12/14/2018.   Subsequently he developed possibly mild aspiration pneumonia for which he was treated with antibiotics and he has finished his 5 day course.  Speech therapy following and now he seems to be stable on dysphagia 3 diet which will be continued.  He also had mild CHF which was treated with IV Lasix.  Overall much improved on 12/18/2018, remains better from pulmonary standpoint on 12/19/2018.     COVID-19 Labs:   Recent Labs    12/18/18 0455 12/19/18 0120 12/20/18 0155  DDIMER 1.42* 1.81* 1.83*  FERRITIN  --  128 104  LDH 392*  --   --   CRP 15.6* 9.4* 3.4*    Lab Results  Component Value Date   SARSCOV2NAA NOT DETECTED 10/14/2018   [place] Not Detected 09/30/2018    Hepatic Function Latest Ref Rng &  Units 12/20/2018 12/19/2018 12/18/2018  Total Protein 6.5 - 8.1 g/dL 5.9(L) 6.3(L) 6.1(L)  Albumin 3.5 - 5.0 g/dL 2.5(L) 2.6(L) 2.4(L)  AST 15 - 41 U/L 18 22 18   ALT 0 - 44 U/L 19 21 22   Alk Phosphatase 38 - 126 U/L 72 81 73  Total Bilirubin 0.3 - 1.2 mg/dL 0.4 0.5 0.5    COVID-19 Medications:  Steroids: 8/25>> Remdesivir: 8/25>> Actemra: Not indicated-given concomitant bacterial pneumonia and elevated procalcitonin levels Convalescent Plasma:N/A Research Studies:N/A     Diarrhea with leukocytosis.  C. difficile ruled out, Imodium as needed.  Acute on chronic diastolic CHF EF 76% on last echocardiogram in 2017.  Has been adequately diuresed currently mildly dehydrated, IV fluids gentle on 12/20/2018.  Hypokalemia.  Replaced.    Fevers.  See #1 above.  Stable bladder scan and UA.  Transaminitis: COVID-19 related.  Has resolved.  OSA - CPAP QHS at home, continue nasal cannula oxygen here.  AKI: Mild likely hemodynamically mediated, gentle hydration on 12/20/2018 and monitor.  COPD: No evidence of exacerbation-continue bronchodilators.  HTN: Currently blood pressure stable on Cardizem & NTG paste.  Will monitor and adjust as needed.  PRN hydralazine also available.  Seizure disorder: Continue Dilantin  History of CVA: No focal deficits-continue Plavix  History of 13 x 10 mm pituitary lesion: Follows with neurosurgery at [facility]  History of prostate cancer: Stable for outpatient follow-up with his outpatient physicians  Debility/deconditioning: Secondary to acute illness-appreciate PT/OT- recommendations are for home health services.  OSA: Claims uses  CPAP at night-currently due to our facility's policy of no CPAP-we will be on home oxygen.    Obesity: BMI of 32.  Follow with PCP for weight loss.  DM-2 (A1c 6.5): Diet controlled at home-CBGs stable with SSI.  CBG (last 3)  Recent Labs    12/19/18 1702 12/19/18 2255 12/20/18 0900  GLUCAP 364* 229* 193*    Lab Results  Component Value Date   HGBA1C 6.5 (H) 12/10/2018     Condition - guarded  Family Communication  : Updated wife and daughter in detail on 12/15/2018.  Daughter wants to be updated daily not the wife. Daughter on 8/31, 9/1,  12/18/2018, 12/20/2018  Code Status :  DNR  Diet :  Diet Order            DIET SOFT Room service appropriate? Yes; Fluid consistency: Thin  Diet effective now               Disposition Plan  :  HHPT once better, home health ordered and DME equipment ordered.  Consults  :  None  Procedures  :  None  DVT Prophylaxis  :  Lovenox   Antibiotics  :    Anti-infectives (From admission, onward)   Start     Dose/Rate Route Frequency Ordered Stop   12/15/18 1000  cefTRIAXone (ROCEPHIN) 1 g in sodium chloride 0.9 % 100 mL IVPB  Status:  Discontinued    Note to Pharmacy: [facility] listed Keflex as an allergy, but it is not listed as significant in our system. The patient was on Levofloxacin and azithromycin at sending facility.   1 g 200 mL/hr over 30 Minutes Intravenous Every 24 hours 12/14/18 1202 12/14/18 1339   12/15/18 1000  cefTRIAXone (ROCEPHIN) 2 g in sodium chloride 0.9 % 100 mL IVPB    Note to Pharmacy: [facility] listed Keflex as an allergy, but it is not listed as significant in our system. The patient was  on Levofloxacin and azithromycin at sending facility.   2 g 200 mL/hr over 30 Minutes Intravenous Every 24 hours 12/14/18 1356 12/17/18 1019   12/14/18 1400  metroNIDAZOLE (FLAGYL) tablet 500 mg     500 mg Oral Every 8 hours 12/14/18 1356 12/18/18 0558   12/11/18 1000  remdesivir 100 mg in sodium chloride 0.9 % 250 mL IVPB     100 mg  500 mL/hr over 30 Minutes Intravenous Every 24 hours 12/10/18 0810 12/14/18 1945   12/10/18 1000  cefTRIAXone (ROCEPHIN) 2 g in sodium chloride 0.9 % 100 mL IVPB    Note to Pharmacy: [facility] listed Keflex as an allergy, but it is not listed as significant in our system. The patient was on Levofloxacin and azithromycin at sending facility.   2 g 200 mL/hr over 30 Minutes Intravenous Every 24 hours 12/10/18 0548 12/14/18 1000   12/10/18 1000  azithromycin (ZITHROMAX) tablet 500 mg     500 mg Oral Daily 12/10/18 0548 12/14/18 0924   12/10/18 1000  remdesivir 200 mg in sodium chloride 0.9 % 250 mL IVPB     200 mg 500 mL/hr over 30 Minutes Intravenous Once 12/10/18 0810 12/10/18 1223     Inpatient Medications  Scheduled Meds: . atorvastatin  10 mg Oral QHS  . benzonatate  200 mg Oral TID  . clopidogrel  75 mg Oral Daily  . diltiazem  300 mg Oral Daily  . enoxaparin (LOVENOX) injection  40 mg Subcutaneous Q24H  . escitalopram  20 mg Oral QHS  . insulin aspart  0-15 Units Subcutaneous TID WC  . insulin aspart  0-5 Units Subcutaneous QHS  . insulin glargine  15 Units Subcutaneous Daily  . Ipratropium-Albuterol  1 puff Inhalation Q6H  . isosorbide mononitrate  30 mg Oral Daily  . methylPREDNISolone (SOLU-MEDROL) injection  40 mg Intravenous Daily  . mometasone-formoterol  2 puff Inhalation BID  . oxybutynin  5 mg Oral TID  . pantoprazole  40 mg Oral Daily  . phenytoin  100 mg Oral Q8H  . topiramate  100 mg Oral BID   Continuous Infusions: . lactated ringers with kcl 100 mL/hr at 12/20/18 0941   PRN Meds:.acetaminophen, albuterol, hydrALAZINE, loperamide, metoprolol tartrate, ondansetron (ZOFRAN) IV, polyethylene glycol, sodium chloride flush   Time Spent in minutes  25  See all Orders from today for further details   Lala Lund M.D on 12/20/2018 at 10:49 AM  To page go to [email or web address] - use universal password  [facility] -  Office  [phone]    Objective:    Vitals:   12/19/18 2300 12/20/18 0000 12/20/18 0100 12/20/18 0745  BP:  132/74  (!) 150/76  Pulse:    79  Resp: (!) 25 (!) 26 (!) 22   Temp:  98.8 F (37.1 C)  98.6 F (37 C)  TempSrc:  Oral  Oral  SpO2: 95% 97% 99%   Weight:      Height:        Wt Readings from Last 3 Encounters:  12/10/18 99 kg  11/16/15 100.2 kg     Intake/Output Summary (Last 24 hours) at 12/20/2018 1049 Last data filed at 12/20/2018 0400 Gross per 24 hour  Intake 960 ml  Output 700 ml  Net 260 ml     Physical Exam  Awake Alert, Oriented X 3, No new F.N deficits, Normal affect [place].AT,PERRAL Supple Neck,No JVD, No cervical lymphadenopathy appriciated.  Symmetrical Chest wall movement, Good air movement bilaterally, CTAB RRR,No Gallops, Rubs  or new Murmurs, No Parasternal Heave +ve B.Sounds, Abd Soft, No tenderness, No organomegaly appriciated, No rebound - guarding or rigidity. No Cyanosis, Clubbing or edema, No new Rash or bruise     Data Review:    CBC Recent Labs  Lab 12/16/18 0815 12/17/18 0345 12/18/18 0455 12/19/18 0120 12/20/18 0155  WBC 12.2* 11.0* 13.4* 20.0* 20.1*  HGB 12.4* 12.0* 11.9* 11.6* 11.2*  HCT 39.4 38.7* 37.6* 37.3* 35.8*  PLT 420* 428* 451* 557* 512*  MCV 89.1 89.4 88.9 90.1 89.7  MCH 28.1 27.7 28.1 28.0 28.1  MCHC 31.5 31.0 31.6 31.1 31.3  RDW 15.3 15.3 15.4 15.4 15.2  LYMPHSABS 1.1 0.8 0.7 0.8 0.8  MONOABS 1.2* 1.1* 0.8 1.0 1.2*  EOSABS 0.1 0.1 0.0 0.0 0.0  BASOSABS 0.0 0.0 0.0 0.1 0.0    Chemistries  Recent Labs  Lab 12/16/18 0815 12/17/18 0345 12/18/18 0455 12/19/18 0120 12/20/18 0155  NA 142 138 138 139 141  K 3.0* 3.7 4.2 3.8 3.4*  CL 104 104 102 99 103  CO2 26 22 25 27 26   GLUCOSE 122* 131* 255* 151* 124*  BUN 21 22 32* 42* 53*  CREATININE 1.28* 1.29* 1.45* 1.46* 1.61*  CALCIUM 8.8* 8.3* 8.0* 8.3* 8.3*  MG 1.9 1.9 1.9 2.2 2.5*  AST 25 23 18 22 18   ALT 27 23 22 21 19   ALKPHOS 86 75 73 81 72  BILITOT 0.4 0.6 0.5 0.5 0.4    ------------------------------------------------------------------------------------------------------------------ No results for input(s): CHOL, HDL, LDLCALC, TRIG, CHOLHDL, LDLDIRECT in the last 72 hours.  Lab Results  Component Value Date   HGBA1C 6.5 (H) 12/10/2018   ------------------------------------------------------------------------------------------------------------------ No results for input(s): TSH, T4TOTAL, T3FREE, THYROIDAB in the last 72 hours.  Invalid input(s): FREET3 ------------------------------------------------------------------------------------------------------------------ Recent Labs    12/19/18 0120 12/20/18 0155  FERRITIN 128 104    Coagulation profile No results for input(s): INR, PROTIME in the last 168 hours.  Recent Labs    12/19/18 0120 12/20/18 0155  DDIMER 1.81* 1.83*    Cardiac Enzymes No results for input(s): CKMB, TROPONINI, MYOGLOBIN in the last 168 hours.  Invalid input(s): CK ------------------------------------------------------------------------------------------------------------------    Component Value Date/Time   BNP 49.5 12/20/2018 0155    Micro Results Recent Results (from the past 240 hour(s))  Culture, blood (routine x 2)     Status: None   Collection Time: 12/14/18  4:30 PM   Specimen: Left Antecubital; Blood  Result Value Ref Range Status   Specimen Description   Final    LEFT ANTECUBITAL Performed at [facility], [place] [address], [place], [place] [address]    Special Requests   Final    BOTTLES DRAWN AEROBIC ONLY Blood Culture adequate volume Performed at [place] [facility] [address], [place], [place] [address]    Culture   Final    NO GROWTH 5 DAYS Performed at [facility], [place] [address], [place], [place] [address]    Report Status 12/19/2018 FINAL  Final  Culture, blood (routine x 2)     Status: None   Collection Time: 12/14/18  4:32 PM   Specimen: BLOOD  LEFT HAND  Result Value Ref Range Status   Specimen Description   Final    BLOOD LEFT HAND Performed at [facility] [address] [place], [place] [address]    Special Requests   Final    BOTTLES DRAWN AEROBIC ONLY Blood Culture adequate volume Performed at [facility],  [place] [address], [place], [place] [address]    Culture   Final    NO GROWTH 5 DAYS Performed at [facility], [place] [address] [place], [place] [address]    Report Status 12/19/2018 FINAL  Final  Culture, Urine     Status: None   Collection Time: 12/17/18  5:30 AM   Specimen: Urine, Clean Catch  Result Value Ref Range Status   Specimen Description   Final    URINE, CLEAN CATCH Performed at [facility], [place] [address], [place], [place] [address]    Special Requests   Final    NONE Performed at [facility], [place] [address] [place], [place] [address]    Culture   Final    NO GROWTH Performed at [facility], [place] [address] [place], [place] [address]    Report Status 12/18/2018 FINAL  Final  C difficile quick scan w PCR reflex     Status: None   Collection Time: 12/19/18  5:00 PM   Specimen: STOOL  Result Value Ref Range Status   C Diff antigen NEGATIVE NEGATIVE Final   C Diff toxin NEGATIVE NEGATIVE Final   C Diff interpretation No C. difficile detected.  Final    Comment: Performed at [facility], [place] [address] [place],  [address]    Radiology Reports Dg Chest [place] 1 View  Result Date: 12/17/2018 CLINICAL DATA:  73 year old male with history of pneumonia from COVID-19. EXAM: PORTABLE CHEST 1 VIEW COMPARISON:  Chest x-ray 12/15/2018. FINDINGS: Lung volumes are low. Patchy multifocal airspace consolidation throughout the lungs bilaterally, asymmetrically distributed (right greater than left) with relative sparing of the left upper lobe. Overall, aeration continues to worsened compared to  the prior study. No evidence of pulmonary edema. Heart size is mildly enlarged. Upper mediastinal contours are unremarkable. IMPRESSION: 1. Worsening multilobar bilateral pneumonia, as above. 2. Mild cardiomegaly. Electronically Signed   By: Vinnie Langton M.D.   On: 12/17/2018 08:53   Dg Chest Port 1 View  Result Date: 12/15/2018 CLINICAL DATA:  Dyspnea EXAM: PORTABLE CHEST 1 VIEW COMPARISON:  Chest radiograph from one day prior. FINDINGS: Stable cardiomediastinal silhouette with normal heart size. No pneumothorax. No pleural effusion. Patchy opacities throughout both lungs, with relative sparing of the upper left lung, not substantially changed. IMPRESSION: No substantial change in extensive patchy bilateral lung opacities compatible with multilobar pneumonia. Electronically Signed   By: Ilona Sorrel M.D.   On: 12/15/2018 10:11   Dg Chest Port 1 View  Result Date: 12/10/2018 CLINICAL DATA:  Pneumonia due to COVID-19 EXAM: PORTABLE CHEST 1 VIEW COMPARISON:  Portable exam 0620 hours compared to 12/09/2018 FINDINGS: Enlargement of cardiac silhouette. Tortuous aorta. Pulmonary vascularity normal. BILATERAL pulmonary infiltrates again seen, little changed. No pleural effusion or pneumothorax. Bones unremarkable. IMPRESSION: Persistent pulmonary infiltrates bilaterally. Electronically Signed   By: Lavonia Dana M.D.   On: 12/10/2018 08:59   Dg Chest Port 1v Same Day  Result Date: 12/14/2018 CLINICAL DATA:  Shortness of breath EXAM: PORTABLE CHEST 1 VIEW COMPARISON:  December 10, 2018 FINDINGS: No pneumothorax. There is opacity diffusely throughout the right lung which is worsened. There is opacity in left base which has worsened. No other interval changes. IMPRESSION: Worsening bilateral pulmonary opacities/infiltrates, diffusely distributed on the right and localize in the left base on the left. This may represent an infectious process. Asymmetric pulmonary edema is considered less likely but possible.  Electronically Signed  By: Dorise Bullion III M.D   On: 12/14/2018 10:52

## 2018-12-20 NOTE — Progress Notes (Signed)
Shift summary Spoke with patients daughter Vaughan Sine at length today about OT/PT req's for rehab and current POC. Patient refused to get out of the bed today despite multiple requests from primary nurse and nursing staff. Patient educated that he needs to get up to prevent aspiration and increase mobility. Patient denied education and refused to comply with POC.

## 2018-12-21 LAB — CBC WITH DIFFERENTIAL/PLATELET
Abs Immature Granulocytes: 0.55 10*3/uL — ABNORMAL HIGH (ref 0.00–0.07)
Basophils Absolute: 0.1 10*3/uL (ref 0.0–0.1)
Basophils Relative: 0 %
Eosinophils Absolute: 0 10*3/uL (ref 0.0–0.5)
Eosinophils Relative: 0 %
HCT: 36.5 % — ABNORMAL LOW (ref 39.0–52.0)
Hemoglobin: 11.4 g/dL — ABNORMAL LOW (ref 13.0–17.0)
Immature Granulocytes: 3 %
Lymphocytes Relative: 6 %
Lymphs Abs: 1.2 10*3/uL (ref 0.7–4.0)
MCH: 28.4 pg (ref 26.0–34.0)
MCHC: 31.2 g/dL (ref 30.0–36.0)
MCV: 91 fL (ref 80.0–100.0)
Monocytes Absolute: 1.8 10*3/uL — ABNORMAL HIGH (ref 0.1–1.0)
Monocytes Relative: 9 %
Neutro Abs: 16 10*3/uL — ABNORMAL HIGH (ref 1.7–7.7)
Neutrophils Relative %: 82 %
Platelets: 462 10*3/uL — ABNORMAL HIGH (ref 150–400)
RBC: 4.01 MIL/uL — ABNORMAL LOW (ref 4.22–5.81)
RDW: 15.1 % (ref 11.5–15.5)
WBC: 19.6 10*3/uL — ABNORMAL HIGH (ref 4.0–10.5)
nRBC: 0.8 % — ABNORMAL HIGH (ref 0.0–0.2)

## 2018-12-21 LAB — MAGNESIUM: Magnesium: 2.3 mg/dL (ref 1.7–2.4)

## 2018-12-21 LAB — BRAIN NATRIURETIC PEPTIDE: B Natriuretic Peptide: 90.9 pg/mL (ref 0.0–100.0)

## 2018-12-21 LAB — COMPREHENSIVE METABOLIC PANEL
ALT: 21 U/L (ref 0–44)
AST: 21 U/L (ref 15–41)
Albumin: 2.3 g/dL — ABNORMAL LOW (ref 3.5–5.0)
Alkaline Phosphatase: 79 U/L (ref 38–126)
Anion gap: 11 (ref 5–15)
BUN: 46 mg/dL — ABNORMAL HIGH (ref 8–23)
CO2: 26 mmol/L (ref 22–32)
Calcium: 8.4 mg/dL — ABNORMAL LOW (ref 8.9–10.3)
Chloride: 106 mmol/L (ref 98–111)
Creatinine, Ser: 1.38 mg/dL — ABNORMAL HIGH (ref 0.61–1.24)
GFR calc Af Amer: 58 mL/min — ABNORMAL LOW (ref >60)
GFR calc non Af Amer: 50 mL/min — ABNORMAL LOW (ref >60)
Glucose, Bld: 178 mg/dL — ABNORMAL HIGH (ref 70–99)
Potassium: 4 mmol/L (ref 3.5–5.1)
Sodium: 143 mmol/L (ref 135–145)
Total Bilirubin: 0.3 mg/dL (ref 0.3–1.2)
Total Protein: 5.5 g/dL — ABNORMAL LOW (ref 6.5–8.1)

## 2018-12-21 LAB — GLUCOSE, CAPILLARY
Glucose-Capillary: 122 mg/dL — ABNORMAL HIGH (ref 70–99)
Glucose-Capillary: 219 mg/dL — ABNORMAL HIGH (ref 70–99)
Glucose-Capillary: 219 mg/dL — ABNORMAL HIGH (ref 70–99)
Glucose-Capillary: 133 mg/dL — ABNORMAL HIGH (ref 70–99)

## 2018-12-21 LAB — C-REACTIVE PROTEIN: CRP: 1.5 mg/dL — ABNORMAL HIGH (ref <1.0)

## 2018-12-21 LAB — FERRITIN: Ferritin: 137 ng/mL (ref 24–336)

## 2018-12-21 LAB — D-DIMER, QUANTITATIVE: D-Dimer, Quant: 2.35 ug/mL-FEU — ABNORMAL HIGH (ref 0.00–0.50)

## 2018-12-21 MED ORDER — LIDOCAINE 5 % EX OINT
TOPICAL_OINTMENT | Freq: Two times a day (BID) | CUTANEOUS | Status: DC | PRN
  Administered 2018-12-22: 10:00:00 via TOPICAL
  Filled 2018-12-21: qty 35.44

## 2018-12-21 MED ORDER — HYDROCORTISONE (PERIANAL) 2.5 % EX CREA
TOPICAL_CREAM | Freq: Three times a day (TID) | CUTANEOUS | Status: DC
  Administered 2018-12-21: 1 via RECTAL
  Administered 2018-12-21 – 2018-12-22 (×3): via RECTAL
  Filled 2018-12-21: qty 28.35

## 2018-12-21 MED ORDER — LOPERAMIDE HCL 2 MG PO CAPS
4.0000 mg | ORAL_CAPSULE | Freq: Once | ORAL | Status: AC
  Administered 2018-12-21: 4 mg via ORAL
  Filled 2018-12-21: qty 2

## 2018-12-21 NOTE — Progress Notes (Signed)
Patient agreed to transfer OOB and sit up in recliner. Pt stood for approx 3 minutes at bedside and then pivoted to chair w 1 assist. Pt up in chair for PM meal

## 2018-12-21 NOTE — Progress Notes (Signed)
Pt encouraged to move OOB up to chair, especially for upcoming discharge to home. Patient refused thus far stating it is too uncomfortable on his backside. Annusol cream being utilized , pillows under pt for comfort, will try again this afternoon. This Probation officer spoke w daughter Lattie Haw while in patient's room so encouragement could be given to increase mobility for pt.

## 2018-12-21 NOTE — Progress Notes (Signed)
Patient found sitting on side of the side.  Patient had pulled his gown off and also pulled his left upper arm midline out.  Patient states that he had had a bad dream and that is what made him sit up in the bed.  Patient comforted and cleaned up.  New linens placed on bed and patient reassured that everything was ok. patient denied any pain or discomfort or distress.  No distress noted.

## 2018-12-21 NOTE — Plan of Care (Signed)
Continue w POC. Pt was up OOB this shift to chair and more compliant with staff on more mobility/activity. Family updated via telephone

## 2018-12-21 NOTE — Progress Notes (Signed)
PROGRESS NOTE                                                                                                                                                                                                             Patient Demographics:    Derrick Bender, is a 73 y.o. male, DOB - 03-08-1946, YPP:[id number]  Outpatient Primary MD for the patient is Glenda Chroman, MD   Admit date - 12/09/2018   LOS - 12  No chief complaint on file.      Brief Narrative: Patient is a 73 y.o. male with PMHx of-not on home O2, DM-2-(diet controlled at home), HTN, CVA, seizure disorder, prostate cancer s/p total prostatectomy, OSA on CPAP-who was transferred from [facility] after he was found to have persistent fever, acute hypoxemic respiratory failure in the setting of COVID-19 pneumonia.  His daughter was recently tested positive for COVID-19.   Subjective:   Patient in bed, appears comfortable, denies any headache, no fever, no chest pain or pressure, no shortness of breath , no abdominal pain. No focal weakness. Does have rectal burning.   Assessment  & Plan :   Acute Hypoxic Resp Failure due to Covid 19 Viral pneumonia with concomitant bacterial pneumonia:   He had developed severe hypoxic respiratory failure and was started on IV steroids and Remdisvir combination, he did not receive Actemra as his procalcitonin levels were elevated.  He is still on 2-4 L of nasal cannula oxygen, unfortunately he has started to run fevers since the evening of 12/14/2018.   Subsequently he developed possibly mild aspiration pneumonia for which he was treated with antibiotics and he has finished his 5 day course.  Speech therapy following and now he seems to be stable on dysphagia 3 diet which will be continued.  He also had mild CHF which was treated with IV Lasix.  Overall much improved on 12/18/2018, remains better from pulmonary standpoint on 12/19/2018.      COVID-19 Labs:  Recent Labs    12/19/18 0120 12/20/18 0155 12/21/18 0045  DDIMER 1.81* 1.83* 2.35*  FERRITIN 128 104 137  CRP 9.4* 3.4* 1.5*    Lab Results  Component Value Date   SARSCOV2NAA NOT DETECTED 10/14/2018   [place] Not Detected 09/30/2018    Hepatic Function Latest Ref Rng & Units 12/21/2018 12/20/2018 12/19/2018  Total  Protein 6.5 - 8.1 g/dL 5.5(L) 5.9(L) 6.3(L)  Albumin 3.5 - 5.0 g/dL 2.3(L) 2.5(L) 2.6(L)  AST 15 - 41 U/L 21 18 22   ALT 0 - 44 U/L 21 19 21   Alk Phosphatase 38 - 126 U/L 79 72 81  Total Bilirubin 0.3 - 1.2 mg/dL 0.3 0.4 0.5    COVID-19 Medications:  Steroids: 8/25>> Remdesivir: 8/25>> finished x 5 doses Actemra: Not indicated-given concomitant bacterial pneumonia and elevated procalcitonin levels Convalescent Plasma:N/A Research Studies:N/A     Diarrhea with leukocytosis.  C. difficile ruled out, Imodium as needed.  Rectal and hemorrhoidal burning.  Topical cream added.    Acute on chronic diastolic CHF EF 26% on last echocardiogram in 2017.  Has been adequately diuresed currently mildly dehydrated, IV fluids gentle on 12/20/2018.  Hypokalemia.  Replaced.    Fevers.  See #1 above.  Stable bladder scan and UA.  Transaminitis: COVID-19 related.  Has resolved.  OSA - CPAP QHS at home, continue nasal cannula oxygen here.  AKI: Mild likely hemodynamically mediated, much improved after IV fluids on 12/20/2018.  COPD: No evidence of exacerbation-continue bronchodilators.  HTN: Currently blood pressure stable on Cardizem & NTG paste.  Will monitor and adjust as needed.  PRN hydralazine also available.  Seizure disorder: Continue Dilantin  History of CVA: No focal deficits-continue Plavix  History of 13 x 10 mm pituitary lesion: Follows with neurosurgery at [facility]  History of prostate cancer: Stable for outpatient follow-up with his outpatient physicians  Debility/deconditioning: Secondary to acute  illness-appreciate PT/OT- recommendations are for home health services.  OSA: Claims uses CPAP at night-currently due to our facility's policy of no CPAP-we will be on home oxygen.    Obesity: BMI of 32.  Follow with PCP for weight loss.  DM-2 (A1c 6.5): Diet controlled at home-CBGs stable with SSI.  CBG (last 3)  Recent Labs    12/20/18 1558 12/20/18 2119 12/21/18 0819  GLUCAP 247* 179* 122*    Lab Results  Component Value Date   HGBA1C 6.5 (H) 12/10/2018     Condition - Fair  Family Communication  : Updated wife and daughter in detail on 12/15/2018.  Daughter wants to be updated daily not the wife. Daughter on 8/31, 9/1,  12/18/2018, 12/20/2018, 12/21/18.  Code Status :  DNR  Diet :  Diet Order            DIET SOFT Room service appropriate? Yes; Fluid consistency: Thin  Diet effective now               Disposition Plan  :  HHPT once better, home health ordered and DME equipment ordered - he refused SNF - DC 12/22/18  Consults  :  None  Procedures  :  None  DVT Prophylaxis  :  Lovenox   Antibiotics  :    Anti-infectives (From admission, onward)   Start     Dose/Rate Route Frequency Ordered Stop   12/15/18 1000  cefTRIAXone (ROCEPHIN) 1 g in sodium chloride 0.9 % 100 mL IVPB  Status:  Discontinued    Note to Pharmacy: [facility] listed Keflex as an allergy, but it is not listed as significant in our system. The patient was on Levofloxacin and azithromycin at sending facility.   1 g 200 mL/hr over 30 Minutes Intravenous Every 24 hours 12/14/18 1202 12/14/18 1339   12/15/18 1000  cefTRIAXone (ROCEPHIN) 2 g in sodium chloride 0.9 % 100 mL IVPB    Note to Pharmacy: [employer]  listed Keflex as an allergy, but it is not listed as significant in our system. The patient was on Levofloxacin and azithromycin at sending facility.   2 g 200 mL/hr over 30 Minutes Intravenous Every 24 hours 12/14/18 1356 12/17/18 1019   12/14/18 1400  metroNIDAZOLE (FLAGYL) tablet 500 mg     500 mg Oral  Every 8 hours 12/14/18 1356 12/18/18 0558   12/11/18 1000  remdesivir 100 mg in sodium chloride 0.9 % 250 mL IVPB     100 mg 500 mL/hr over 30 Minutes Intravenous Every 24 hours 12/10/18 0810 12/14/18 1945   12/10/18 1000  cefTRIAXone (ROCEPHIN) 2 g in sodium chloride 0.9 % 100 mL IVPB    Note to Pharmacy: [facility] listed Keflex as an allergy, but it is not listed as significant in our system. The patient was on Levofloxacin and azithromycin at sending facility.   2 g 200 mL/hr over 30 Minutes Intravenous Every 24 hours 12/10/18 0548 12/14/18 1000   12/10/18 1000  azithromycin (ZITHROMAX) tablet 500 mg     500 mg Oral Daily 12/10/18 0548 12/14/18 0924   12/10/18 1000  remdesivir 200 mg in sodium chloride 0.9 % 250 mL IVPB     200 mg 500 mL/hr over 30 Minutes Intravenous Once 12/10/18 0810 12/10/18 1223     Inpatient Medications  Scheduled Meds: . atorvastatin  10 mg Oral QHS  . benzonatate  200 mg Oral TID  . clopidogrel  75 mg Oral Daily  . diltiazem  300 mg Oral Daily  . enoxaparin (LOVENOX) injection  40 mg Subcutaneous Q24H  . escitalopram  20 mg Oral QHS  . feeding supplement (ENSURE ENLIVE)  237 mL Oral BID BM  . hydrocortisone   Rectal TID  . hydrocortisone cream   Topical TID  . insulin aspart  0-15 Units Subcutaneous TID WC  . insulin aspart  0-5 Units Subcutaneous QHS  . insulin glargine  15 Units Subcutaneous Daily  . Ipratropium-Albuterol  1 puff Inhalation Q6H  . isosorbide mononitrate  30 mg Oral Daily  . loperamide  4 mg Oral Once  . methylPREDNISolone (SOLU-MEDROL) injection  40 mg Intravenous Daily  . mometasone-formoterol  2 puff Inhalation BID  . oxybutynin  5 mg Oral TID  . pantoprazole  40 mg Oral Daily  . phenytoin  100 mg Oral Q8H  . topiramate  100 mg Oral BID   Continuous Infusions:  PRN Meds:.acetaminophen, albuterol, hydrALAZINE, lidocaine, loperamide, metoprolol tartrate, ondansetron (ZOFRAN) IV, polyethylene glycol, sodium chloride flush   Time  Spent in minutes  25  See all Orders from today for further details   Lala Lund M.D on 12/21/2018 at 8:55 AM  To page go to [email or web address] - use universal password  [facility] -  Office  [phone]    Objective:   Vitals:   12/20/18 1600 12/20/18 2000 12/20/18 2335 12/21/18 0445  BP: (!) 143/69 (!) 156/96 (!) 160/84 (!) 145/63  Pulse:    72  Resp: (!) 27 (!) 24 (!) 28 (!) 23  Temp: 98.7 F (37.1 C) 98.6 F (37 C) 98.2 F (36.8 C) 98.8 F (37.1 C)  TempSrc: Oral Oral Oral Oral  SpO2: 96% 97% 99% 93%  Weight:      Height:        Wt Readings from Last 3 Encounters:  12/10/18 99 kg  11/16/15 100.2 kg     Intake/Output Summary (Last 24 hours) at 12/21/2018 0855 Last data filed at 12/21/2018 0600 Gross per  24 hour  Intake 1259.19 ml  Output 952 ml  Net 307.19 ml     Physical Exam  Awake Alert, Oriented X 3, No new F.N deficits, Normal affect [place].AT,PERRAL Supple Neck,No JVD, No cervical lymphadenopathy appriciated.  Symmetrical Chest wall movement, Good air movement bilaterally, CTAB RRR,No Gallops, Rubs or new Murmurs, No Parasternal Heave +ve B.Sounds, Abd Soft, No tenderness, No organomegaly appriciated, No rebound - guarding or rigidity. No Cyanosis, Clubbing or edema, No new Rash or bruise    Data Review:    CBC Recent Labs  Lab 12/17/18 0345 12/18/18 0455 12/19/18 0120 12/20/18 0155 12/21/18 0045  WBC 11.0* 13.4* 20.0* 20.1* 19.6*  HGB 12.0* 11.9* 11.6* 11.2* 11.4*  HCT 38.7* 37.6* 37.3* 35.8* 36.5*  PLT 428* 451* 557* 512* 462*  MCV 89.4 88.9 90.1 89.7 91.0  MCH 27.7 28.1 28.0 28.1 28.4  MCHC 31.0 31.6 31.1 31.3 31.2  RDW 15.3 15.4 15.4 15.2 15.1  LYMPHSABS 0.8 0.7 0.8 0.8 1.2  MONOABS 1.1* 0.8 1.0 1.2* 1.8*  EOSABS 0.1 0.0 0.0 0.0 0.0  BASOSABS 0.0 0.0 0.1 0.0 0.1    Chemistries  Recent Labs  Lab 12/17/18 0345 12/18/18 0455 12/19/18 0120 12/20/18 0155 12/21/18 0045  NA 138 138 139 141 143  K 3.7 4.2 3.8 3.4* 4.0  CL  104 102 99 103 106  CO2 22 25 27 26 26   GLUCOSE 131* 255* 151* 124* 178*  BUN 22 32* 42* 53* 46*  CREATININE 1.29* 1.45* 1.46* 1.61* 1.38*  CALCIUM 8.3* 8.0* 8.3* 8.3* 8.4*  MG 1.9 1.9 2.2 2.5* 2.3  AST 23 18 22 18 21   ALT 23 22 21 19 21   ALKPHOS 75 73 81 72 79  BILITOT 0.6 0.5 0.5 0.4 0.3   ------------------------------------------------------------------------------------------------------------------ No results for input(s): CHOL, HDL, LDLCALC, TRIG, CHOLHDL, LDLDIRECT in the last 72 hours.  Lab Results  Component Value Date   HGBA1C 6.5 (H) 12/10/2018   ------------------------------------------------------------------------------------------------------------------ No results for input(s): TSH, T4TOTAL, T3FREE, THYROIDAB in the last 72 hours.  Invalid input(s): FREET3 ------------------------------------------------------------------------------------------------------------------ Recent Labs    12/20/18 0155 12/21/18 0045  FERRITIN 104 137    Coagulation profile No results for input(s): INR, PROTIME in the last 168 hours.  Recent Labs    12/20/18 0155 12/21/18 0045  DDIMER 1.83* 2.35*    Cardiac Enzymes No results for input(s): CKMB, TROPONINI, MYOGLOBIN in the last 168 hours.  Invalid input(s): CK ------------------------------------------------------------------------------------------------------------------    Component Value Date/Time   BNP 90.9 12/21/2018 0045    Micro Results Recent Results (from the past 240 hour(s))  Culture, blood (routine x 2)     Status: None   Collection Time: 12/14/18  4:30 PM   Specimen: Left Antecubital; Blood  Result Value Ref Range Status   Specimen Description   Final    LEFT ANTECUBITAL Performed at [place] [address] [place], [place] [address]    Special Requests   Final    BOTTLES DRAWN AEROBIC ONLY Blood Culture adequate volume Performed at [place] [address], [place], [place] [address]    Culture   Final    NO GROWTH 5 DAYS Performed at [facility], [place] [address], [place], [place] [address]    Report Status 12/19/2018 FINAL  Final  Culture, blood (routine x 2)     Status: None   Collection Time: 12/14/18  4:32 PM   Specimen: BLOOD LEFT HAND  Result Value Ref Range  Status   Specimen Description   Final    BLOOD LEFT HAND Performed at [place] [address] [place], [place] [address]    Special Requests   Final    BOTTLES DRAWN AEROBIC ONLY Blood Culture adequate volume Performed at [place] [address] [place], [place] [address]    Culture   Final    NO GROWTH 5 DAYS Performed at [facility], [place] [address] [place], [place] [address]    Report Status 12/19/2018 FINAL  Final  Culture, Urine     Status: None   Collection Time: 12/17/18  5:30 AM   Specimen: Urine, Clean Catch  Result Value Ref Range Status   Specimen Description   Final    URINE, CLEAN CATCH Performed at [facility], [place] [address], [place], [place] [address]    Special Requests   Final    NONE Performed at [facility], [place] [address], [place], [place] [address]    Culture   Final    NO GROWTH Performed at [facility], [place] [address], [place], [place] [address]    Report Status 12/18/2018 FINAL  Final  C difficile quick scan w PCR reflex     Status: None   Collection Time: 12/19/18  5:00 PM   Specimen: STOOL  Result Value Ref Range Status   C Diff antigen NEGATIVE NEGATIVE Final   C Diff toxin NEGATIVE NEGATIVE Final   C Diff interpretation No C. difficile detected.  Final    Comment: Performed at [facility], [place] [address] [place], [place] [address]    Radiology Reports Dg Chest [place] 1 View  Result Date: 12/17/2018 CLINICAL DATA:  73 year old male with history of pneumonia from COVID-19. EXAM: PORTABLE  CHEST 1 VIEW COMPARISON:  Chest x-ray 12/15/2018. FINDINGS: Lung volumes are low. Patchy multifocal airspace consolidation throughout the lungs bilaterally, asymmetrically distributed (right greater than left) with relative sparing of the left upper lobe. Overall, aeration continues to worsened compared to the prior study. No evidence of pulmonary edema. Heart size is mildly enlarged. Upper mediastinal contours are unremarkable. IMPRESSION: 1. Worsening multilobar bilateral pneumonia, as above. 2. Mild cardiomegaly. Electronically Signed   By: Vinnie Langton M.D.   On: 12/17/2018 08:53   Dg Chest Port 1 View  Result Date: 12/15/2018 CLINICAL DATA:  Dyspnea EXAM: PORTABLE CHEST 1 VIEW COMPARISON:  Chest radiograph from one day prior. FINDINGS: Stable cardiomediastinal silhouette with normal heart size. No pneumothorax. No pleural effusion. Patchy opacities throughout both lungs, with relative sparing of the upper left lung, not substantially changed. IMPRESSION: No substantial change in extensive patchy bilateral lung opacities compatible with multilobar pneumonia. Electronically Signed   By: Ilona Sorrel M.D.   On: 12/15/2018 10:11   Dg Chest Port 1 View  Result Date: 12/10/2018 CLINICAL DATA:  Pneumonia due to COVID-19 EXAM: PORTABLE CHEST 1 VIEW COMPARISON:  Portable exam 0620 hours compared to 12/09/2018 FINDINGS: Enlargement of cardiac silhouette. Tortuous aorta. Pulmonary vascularity normal. BILATERAL pulmonary infiltrates again seen, little changed. No pleural effusion or pneumothorax. Bones unremarkable. IMPRESSION: Persistent pulmonary infiltrates bilaterally. Electronically Signed   By: Lavonia Dana M.D.   On: 12/10/2018 08:59   Dg Chest Port 1v Same Day  Result Date: 12/14/2018 CLINICAL DATA:  Shortness of breath EXAM: PORTABLE CHEST 1 VIEW COMPARISON:  December 10, 2018 FINDINGS: No pneumothorax. There is opacity diffusely throughout the right lung which is worsened.  There is opacity in left  base which has worsened. No other interval changes. IMPRESSION: Worsening bilateral pulmonary opacities/infiltrates, diffusely distributed on the right and localize in the left base on the left. This may represent an infectious process. Asymmetric pulmonary edema is considered less likely but possible. Electronically Signed   By: Dorise Bullion III M.D   On: 12/14/2018 10:52

## 2018-12-22 LAB — GLUCOSE, CAPILLARY
Glucose-Capillary: 83 mg/dL (ref 70–99)
Glucose-Capillary: 198 mg/dL — ABNORMAL HIGH (ref 70–99)

## 2018-12-22 MED ORDER — FUROSEMIDE 10 MG/ML IJ SOLN
40.0000 mg | Freq: Once | INTRAMUSCULAR | Status: AC
  Administered 2018-12-22: 09:00:00 40 mg via INTRAVENOUS
  Filled 2018-12-22: qty 4

## 2018-12-22 MED ORDER — HYDROCORTISONE (PERIANAL) 1 % EX CREA: 1.0000 | TOPICAL_CREAM | Freq: Three times a day (TID) | CUTANEOUS | 0 refills | Status: DC | PRN

## 2018-12-22 MED ORDER — DILTIAZEM HCL ER BEADS 240 MG PO CP24: 240.0000 mg | ORAL_CAPSULE | Freq: Every day | ORAL | 0 refills | Status: DC

## 2018-12-22 MED ORDER — ISOSORBIDE MONONITRATE ER 30 MG PO TB24: 30.0000 mg | ORAL_TABLET | Freq: Every day | ORAL | 0 refills | Status: DC

## 2018-12-22 MED ORDER — LOPERAMIDE HCL 2 MG PO TABS: 2.0000 mg | ORAL_TABLET | Freq: Four times a day (QID) | ORAL | 0 refills | Status: DC | PRN

## 2018-12-22 MED ORDER — PREDNISONE 5 MG PO TABS: ORAL_TABLET | ORAL | 0 refills | Status: DC

## 2018-12-22 MED ORDER — PREDNISONE 10 MG PO TABS: 10.0000 mg | ORAL_TABLET | Freq: Every day | ORAL | Status: DC

## 2018-12-22 NOTE — Progress Notes (Signed)
Patient discharged from [facility] via [facility] to care of family in private vehicle. All personal belongings w pt at time of discharge. Discharge instructions and prescriptions reviewed with pt and his daughter Lattie Haw at this time. Both verbalized understanding. This Probation officer discussed w daughter and pt  pulse ox monitoring, temperature  and s/s of distress. No further questions or concerns at time of discharge.

## 2018-12-22 NOTE — Discharge Instructions (Signed)
Follow with Primary MD Glenda Chroman, MD in 7 days   Get CBC, CMP, 2 view Chest X ray -  checked next visit within 1 week by Primary MD   Activity: As tolerated with Full fall precautions use walker/cane & assistance as needed  Disposition Home    Diet: Soft heart healthy diet with feeding assistance and aspiration precautions, take all meals sitting up in chair.  1.5 L/day total fluid restriction.   Special Instructions: If you have smoked or chewed Tobacco  in the last 2 yrs please stop smoking, stop any regular Alcohol  and or any Recreational drug use.  On your next visit with your primary care physician please Get Medicines reviewed and adjusted.  Please request your Prim.MD to go over all Hospital Tests and Procedure/Radiological results at the follow up, please get all Hospital records sent to your Prim MD by signing hospital release before you go home.  If you experience worsening of your admission symptoms, develop shortness of breath, life threatening emergency, suicidal or homicidal thoughts you must seek medical attention immediately by calling 911 or calling your MD immediately  if symptoms less severe.  You Must read complete instructions/literature along with all the possible adverse reactions/side effects for all the Medicines you take and that have been prescribed to you. Take any new Medicines after you have completely understood and accpet all the possible adverse reactions/side effects.        Person Under Monitoring Name: Derrick Bender  Location: [place] [id number]   Infection Prevention Recommendations for Individuals Confirmed to have, or Being Evaluated for, 2019 Novel Coronavirus (COVID-19) Infection Who Receive Care at Home  Individuals who are confirmed to have, or are being evaluated for, COVID-19 should follow the prevention steps below until a healthcare provider or local or state health department says they can return to  normal activities.  Stay home except to get medical care You should restrict activities outside your home, except for getting medical care. Do not go to work, school, or public areas, and do not use public transportation or taxis.  Call ahead before visiting your doctor Before your medical appointment, call the healthcare provider and tell them that you have, or are being evaluated for, COVID-19 infection. This will help the healthcare providers office take steps to keep other people from getting infected. Ask your healthcare provider to call the local or state health department.  Monitor your symptoms Seek prompt medical attention if your illness is worsening (e.g., difficulty breathing). Before going to your medical appointment, call the healthcare provider and tell them that you have, or are being evaluated for, COVID-19 infection. Ask your healthcare provider to call the local or state health department.  Wear a facemask You should wear a facemask that covers your nose and mouth when you are in the same room with other people and when you visit a healthcare provider. People who live with or visit you should also wear a facemask while they are in the same room with you.  Separate yourself from other people in your home As much as possible, you should stay in a different room from other people in your home. Also, you should use a separate bathroom, if available.  Avoid sharing household items You should not share dishes, drinking glasses, cups, eating utensils, towels, bedding, or other items with other people in your home. After using these items, you should wash them thoroughly with soap and water.  Cover your  coughs and sneezes Cover your mouth and nose with a tissue when you cough or sneeze, or you can cough or sneeze into your sleeve. Throw used tissues in a lined trash can, and immediately wash your hands with soap and water for at least 20 seconds or use an alcohol-based  hand rub.  Wash your [facility] your hands often and thoroughly with soap and water for at least 20 seconds. You can use an alcohol-based hand sanitizer if soap and water are not available and if your hands are not visibly dirty. Avoid touching your eyes, nose, and mouth with unwashed hands.   Prevention Steps for Caregivers and Household Members of Individuals Confirmed to have, or Being Evaluated for, COVID-19 Infection Being Cared for in the Home  If you live with, or provide care at home for, a person confirmed to have, or being evaluated for, COVID-19 infection please follow these guidelines to prevent infection:  Follow healthcare providers instructions Make sure that you understand and can help the patient follow any healthcare provider instructions for all care.  Provide for the patients basic needs You should help the patient with basic needs in the home and provide support for getting groceries, prescriptions, and other personal needs.  Monitor the patients symptoms If they are getting sicker, call his or her medical provider and tell them that the patient has, or is being evaluated for, COVID-19 infection. This will help the healthcare providers office take steps to keep other people from getting infected. Ask the healthcare provider to call the local or state health department.  Limit the number of people who have contact with the patient  If possible, have only one caregiver for the patient.  Other household members should stay in another home or place of residence. If this is not possible, they should stay  in another room, or be separated from the patient as much as possible. Use a separate bathroom, if available.  Restrict visitors who do not have an essential need to be in the home.  Keep older adults, very young children, and other sick people away from the patient Keep older adults, very young children, and those who have compromised immune systems or  chronic health conditions away from the patient. This includes people with chronic heart, lung, or kidney conditions, diabetes, and cancer.  Ensure good ventilation Make sure that shared spaces in the home have good air flow, such as from an air conditioner or an opened window, weather permitting.  Wash your hands often  Wash your hands often and thoroughly with soap and water for at least 20 seconds. You can use an alcohol based hand sanitizer if soap and water are not available and if your hands are not visibly dirty.  Avoid touching your eyes, nose, and mouth with unwashed hands.  Use disposable paper towels to dry your hands. If not available, use dedicated cloth towels and replace them when they become wet.  Wear a facemask and gloves  Wear a disposable facemask at all times in the room and gloves when you touch or have contact with the patients blood, body fluids, and/or secretions or excretions, such as sweat, saliva, sputum, nasal mucus, vomit, urine, or feces.  Ensure the mask fits over your nose and mouth tightly, and do not touch it during use.  Throw out disposable facemasks and gloves after using them. Do not reuse.  Wash your hands immediately after removing your facemask and gloves.  If your personal clothing becomes contaminated, carefully  remove clothing and launder. Wash your hands after handling contaminated clothing.  Place all used disposable facemasks, gloves, and other waste in a lined container before disposing them with other household waste.  Remove gloves and wash your hands immediately after handling these items.  Do not share dishes, glasses, or other household items with the patient  Avoid sharing household items. You should not share dishes, drinking glasses, cups, eating utensils, towels, bedding, or other items with a patient who is confirmed to have, or being evaluated for, COVID-19 infection.  After the person uses these items, you should wash them  thoroughly with soap and water.  Wash laundry thoroughly  Immediately remove and wash clothes or bedding that have blood, body fluids, and/or secretions or excretions, such as sweat, saliva, sputum, nasal mucus, vomit, urine, or feces, on them.  Wear gloves when handling laundry from the patient.  Read and follow directions on labels of laundry or clothing items and detergent. In general, wash and dry with the warmest temperatures recommended on the label.  Clean all areas the individual has used often  Clean all touchable surfaces, such as counters, tabletops, doorknobs, bathroom fixtures, toilets, phones, keyboards, tablets, and bedside tables, every day. Also, clean any surfaces that may have blood, body fluids, and/or secretions or excretions on them.  Wear gloves when cleaning surfaces the patient has come in contact with.  Use a diluted bleach solution (e.g., dilute bleach with 1 part bleach and 10 parts water) or a household disinfectant with a label that says EPA-registered for coronaviruses. To make a bleach solution at home, add 1 tablespoon of bleach to 1 quart (4 cups) of water. For a larger supply, add  cup of bleach to 1 gallon (16 cups) of water.  Read labels of cleaning products and follow recommendations provided on product labels. Labels contain instructions for safe and effective use of the cleaning product including precautions you should take when applying the product, such as wearing gloves or eye protection and making sure you have good ventilation during use of the product.  Remove gloves and wash hands immediately after cleaning.  Monitor yourself for signs and symptoms of illness Caregivers and household members are considered close contacts, should monitor their health, and will be asked to limit movement outside of the home to the extent possible. Follow the monitoring steps for close contacts listed on the symptom monitoring form.   ? If you have additional  questions, contact your local health department or call the epidemiologist on call at [phone] (available 24/7). ? This guidance is subject to change. For the most up-to-date guidance from [facility], please refer to their website: [email or web address]

## 2018-12-22 NOTE — Plan of Care (Signed)
D/C per MD order

## 2018-12-22 NOTE — Discharge Summary (Signed)
ORVILL EDE [id number] DOB: 1945-12-15 DOA: 12/09/2018  PCP: Glenda Chroman, MD  Admit date: 12/09/2018  Discharge date: 12/22/2018  Admitted From: Home   Disposition:  Home - Refused SNF   Recommendations for Outpatient Follow-up:   Follow up with PCP in 1-2 weeks  PCP Please obtain BMP/CBC, 2 view CXR in 1week,  (see Discharge instructions)   PCP Please follow up on the following pending results:    Home Health: PT, RN, SLP   Equipment/Devices: o2 2Lit, 3 in 1  Consultations: None  Discharge Condition: Stable    CODE STATUS: Full    Diet Recommendation: Soft  CC - SOB   Brief history of present illness from the day of admission and additional interim summary    Patient is a 73 y.o. male with PMHx of-not on home O2, DM-2-(diet controlled at home), HTN, CVA, seizure disorder, prostate cancer s/p total prostatectomy, OSA on CPAP-who was transferred from [facility] after he was found to have persistent fever, acute hypoxemic respiratory failure in the setting of COVID-19 pneumonia.  His daughter was recently tested positive for COVID-19.                                                                 Hospital Course    Acute Hypoxic Resp Failure due to Covid 19 Viral pneumonia with concomitant bacterial pneumonia:   He had developed severe hypoxic respiratory failure and was started on IV steroids and Remdisvir combination, he did not receive Actemra as his procalcitonin levels were elevated.  He is currently on 3 L of nasal cannula oxygen, unfortunately he has started to run fevers since the evening of 12/14/2018.   Subsequently he developed possibly mild aspiration pneumonia for which he was treated with antibiotics and he has finished his 5 day course.  Speech therapy following and now he seems to  be stable on dysphagia 3 diet which will be continued.  He also had mild CHF which was treated with IV Lasix.    He has now finished his IV steroids and Remdisvir course.  Now clinically much improved, no signs of ongoing aspiration activity, continue soft diet, has been adequately diuresed, now on 2-1/2 to 3-1/2 L oxygen on which she will be discharged home, he was recommended to go to SNF but he refused and chose to go to home with home health.  Plan was discussed with patient's daughter who said family will do their best to provide treatment.  Will get a short course of oral steroid taper, nebulizer treatment, home oxygen upon discharge, there is question if he takes 10 mg of prednisone on a chronic basis which I will defer to his PCP to resume.    [place]    12/20/18 0155 12/21/18 0045  DDIMER 1.83* 2.35*  FERRITIN 104 137  CRP 3.4* 1.5*    Lab Results  Component Value Date   SARSCOV2NAA NOT DETECTED 10/14/2018   [place] Not Detected 09/30/2018    Diarrhea with leukocytosis.  C. difficile ruled out, Imodium as needed.  Diarrhea improved.  Rectal and hemorrhoidal burning.    Improved with Anusol cream which will be given at home.  Acute on chronic diastolic CHF EF [id number] on last echocardiogram in 2017.  He has been   Been adequately diuresed, follow with PCP for need for oral diuretic in the future..  Hypokalemia.  Replaced.    Fevers.  See #1 above.  Stable bladder scan and UA.  Transaminitis: COVID-19 related.  Has resolved.  OSA - CPAP QHS at home, continue nasal cannula oxygen here.  AKI: Mild likely hemodynamically mediated, much improved after IV fluids on 12/20/2018.  Will hold ARB upon discharge and have him follow with PCP for repeat BMP monitoring  COPD: No evidence of exacerbation-continue bronchodilators.  HTN: Currently blood pressure stable on Cardizem & NTG paste.  Will monitor and adjust as needed.  PRN hydralazine also  available.  Seizure disorder: Continue Dilantin  History of CVA: No focal deficits-continue Plavix  History of 13 x 10 mm pituitary lesion: Follows with neurosurgery at [facility]  History of prostate cancer: Stable for outpatient follow-up with his outpatient physicians  Debility/deconditioning: Secondary to acute illness-appreciate PT/OT- recommendations are for home health services.  OSA: Continue CPAP at night at home..    Obesity: BMI of 32.  Follow with PCP for weight loss.  DM-2 (A1c 6.5): Diet controlled at home-continue.  Discharge diagnosis     Principal Problem:   Pneumonia due to COVID-19 virus Active Problems:   COPD (chronic obstructive pulmonary disease) ([place])   Hyperlipidemia   Seizure (HCC)   Depression   Hypertension   Type 2 diabetes mellitus ([place])    Discharge instructions    Discharge Instructions    Discharge instructions   Complete by: As directed    Follow with Primary MD Jerene Bears B, MD in 7 days   Get CBC, CMP, 2 view Chest X ray -  checked next visit within 1 week by Primary MD   Activity: As tolerated with Full fall precautions use walker/cane & assistance as needed  Disposition Home    Diet: Soft heart healthy diet with feeding assistance and aspiration precautions, take all meals sitting up in chair.  1.5 L/day total fluid restriction.   Special Instructions: If you have smoked or chewed Tobacco  in the last 2 yrs please stop smoking, stop any regular Alcohol  and or any Recreational drug use.  On your next visit with your primary care physician please Get Medicines reviewed and adjusted.  Please request your Prim.MD to go over all Hospital Tests and Procedure/Radiological results at the follow up, please get all Hospital records sent to your Prim MD by signing hospital release before you go home.  If you experience worsening of your admission symptoms, develop shortness of breath, life threatening  emergency, suicidal or homicidal thoughts you must seek medical attention immediately by calling 911 or calling your MD immediately  if symptoms less severe.  You Must read complete instructions/literature along with all the possible adverse reactions/side effects for all the Medicines you take and that have been prescribed to you. Take any new Medicines after you have completely understood and accpet all the possible adverse reactions/side effects.   Increase  activity slowly   Complete by: As directed    MyChart COVID-19 home monitoring program   Complete by: Dec 22, 2018    Is the patient willing to use the [place] for home monitoring?: Yes   Temperature monitoring   Complete by: Dec 22, 2018    After how many days would you like to receive a notification of this patient's flowsheet entries?: 1      Discharge Medications   Allergies as of 12/22/2018      Reactions   Sulfamethoxazole Dermatitis      Medication List    STOP taking these medications   lisinopril 20 MG tablet Commonly known as: ZESTRIL     TAKE these medications   clopidogrel 75 MG tablet Commonly known as: PLAVIX Take 75 mg by mouth daily.   diazepam 5 MG tablet Commonly known as: VALIUM Take 5 mg by mouth 2 (two) times daily as needed.   diltiazem 240 MG 24 hr capsule Commonly known as: TIAZAC Take 1 capsule (240 mg total) by mouth daily. What changed:   medication strength  how much to take   escitalopram 20 MG tablet Commonly known as: LEXAPRO Take 20 mg by mouth at bedtime.   Hydrocortisone (Perianal) 1 % Crea Commonly known as: Procto-Pak Place 1 Bottle rectally 3 (three) times daily as needed (rectal discomfort).   hydrOXYzine 25 MG tablet Commonly known as: ATARAX/VISTARIL Take 25 mg by mouth 3 (three) times daily.   isosorbide mononitrate 30 MG 24 hr tablet Commonly known as: IMDUR Take 1 tablet (30 mg total) by mouth daily. Start taking on: December 23, 2018   loperamide  2 MG tablet Commonly known as: Imodium A-D Take 1 tablet (2 mg total) by mouth 4 (four) times daily as needed for diarrhea or loose stools.   Melatonin 10 MG Tabs Take 10 mg by mouth at bedtime.   omeprazole 40 MG capsule Commonly known as: PRILOSEC Take 1 capsule by mouth daily.   oxybutynin 5 MG tablet Commonly known as: DITROPAN Take 1 tablet by mouth 3 (three) times daily.   phenytoin 100 MG ER capsule Commonly known as: DILANTIN Take 100 mg by mouth 3 (three) times daily.   predniSONE 10 MG tablet Commonly known as: DELTASONE Take 1 tablet (10 mg total) by mouth daily. Once you finish the prescribed prednisone taper given at the hospital you can continue this medication as before if you were supposed to be on it chronically.  Check with your primary care physician. What changed: additional instructions   predniSONE 5 MG tablet Commonly known as: DELTASONE Label  & dispense according to the schedule below. take 8 Pills PO for 3 days, 6 Pills PO for 3 days, 4 Pills PO for 3 days, 2 Pills PO for 3 days, then continue your previous home dose of prednisone if your family physician asks you to do so, call your family physician 2 days prior to this course is finishing to cross check. Total 65 pills. What changed: You were already taking a medication with the same name, and this prescription was added. Make sure you understand how and when to take each.   ProAir HFA 108 (90 Base) MCG/ACT inhaler Generic drug: albuterol Inhale 2 puffs into the lungs every 6 (six) hours as needed.   simvastatin 20 MG tablet Commonly known as: ZOCOR Take 20 mg by mouth at bedtime.   Symbicort 160-4.5 MCG/ACT inhaler Generic drug: budesonide-formoterol Inhale 2 puffs into the lungs daily.  Testosterone 12.5 MG/ACT (1%) Gel Apply 1.25 g topically daily.   topiramate 100 MG tablet Commonly known as: TOPAMAX Take 100 mg by mouth 2 (two) times daily.            Durable Medical Equipment   (From admission, onward)         Start     Ordered   12/18/18 1038  For home use only DME 3 n 1  Once     12/18/18 1037   12/18/18 1038  For home use only DME Walker rolling  Once    Comments: 5 wheel  Question:  Patient needs a walker to treat with the following condition  Answer:  Weakness   12/18/18 1037   12/18/18 1037  For home use only DME oxygen  Once    Question Answer Comment  Length of Need 6 Months   Mode or (Route) Nasal cannula   Liters per Minute 3   Frequency Continuous (stationary and portable oxygen unit needed)   Oxygen conserving device Yes   Oxygen delivery system Gas      12/18/18 1037          Follow-up Information    [facility]. Call.   Why: Please call in the event you do not hear back regarding Home Health needs (PT/OT) Contact information: [phone]       Jerene Bears B, MD. Schedule an appointment as soon as possible for a visit in 1 week(s).   Specialty: Internal Medicine Contact information: [place] [address] [phone]           Major procedures and Radiology Reports - PLEASE review detailed and final reports thoroughly  -         Dg Chest Port 1 View  Result Date: 12/17/2018 CLINICAL DATA:  73 year old male with history of pneumonia from COVID-19. EXAM: PORTABLE CHEST 1 VIEW COMPARISON:  Chest x-ray 12/15/2018. FINDINGS: Lung volumes are low. Patchy multifocal airspace consolidation throughout the lungs bilaterally, asymmetrically distributed (right greater than left) with relative sparing of the left upper lobe. Overall, aeration continues to worsened compared to the prior study. No evidence of pulmonary edema. Heart size is mildly enlarged. Upper mediastinal contours are unremarkable. IMPRESSION: 1. Worsening multilobar bilateral pneumonia, as above. 2. Mild cardiomegaly. Electronically Signed   By: Vinnie Langton M.D.   On: 12/17/2018 08:53   Dg Chest Port 1 View  Result Date: 12/15/2018 CLINICAL  DATA:  Dyspnea EXAM: PORTABLE CHEST 1 VIEW COMPARISON:  Chest radiograph from one day prior. FINDINGS: Stable cardiomediastinal silhouette with normal heart size. No pneumothorax. No pleural effusion. Patchy opacities throughout both lungs, with relative sparing of the upper left lung, not substantially changed. IMPRESSION: No substantial change in extensive patchy bilateral lung opacities compatible with multilobar pneumonia. Electronically Signed   By: Ilona Sorrel M.D.   On: 12/15/2018 10:11   Dg Chest Port 1 View  Result Date: 12/10/2018 CLINICAL DATA:  Pneumonia due to COVID-19 EXAM: PORTABLE CHEST 1 VIEW COMPARISON:  Portable exam 0620 hours compared to 12/09/2018 FINDINGS: Enlargement of cardiac silhouette. Tortuous aorta. Pulmonary vascularity normal. BILATERAL pulmonary infiltrates again seen, little changed. No pleural effusion or pneumothorax. Bones unremarkable. IMPRESSION: Persistent pulmonary infiltrates bilaterally. Electronically Signed   By: Lavonia Dana M.D.   On: 12/10/2018 08:59   Dg Chest Port 1v Same Day  Result Date: 12/14/2018 CLINICAL DATA:  Shortness of breath EXAM: PORTABLE CHEST 1 VIEW COMPARISON:  December 10, 2018 FINDINGS: No pneumothorax.  There is opacity diffusely throughout the right lung which is worsened. There is opacity in left base which has worsened. No other interval changes. IMPRESSION: Worsening bilateral pulmonary opacities/infiltrates, diffusely distributed on the right and localize in the left base on the left. This may represent an infectious process. Asymmetric pulmonary edema is considered less likely but possible. Electronically Signed   By: Dorise Bullion III M.D   On: 12/14/2018 10:52    Micro Results    Recent Results (from the past 240 hour(s))  Culture, blood (routine x 2)     Status: None   Collection Time: 12/14/18  4:30 PM   Specimen: Left Antecubital; Blood  Result Value Ref Range Status   Specimen Description   Final    LEFT  ANTECUBITAL Performed at [facility], [place] [address] [place], [place] [address]    Special Requests   Final    BOTTLES DRAWN AEROBIC ONLY Blood Culture adequate volume Performed at [place] [address] [place], [place] [address]    Culture   Final    NO GROWTH 5 DAYS Performed at [facility], [place] [address] [place], [place] [address]    Report Status 12/19/2018 FINAL  Final  Culture, blood (routine x 2)     Status: None   Collection Time: 12/14/18  4:32 PM   Specimen: BLOOD LEFT HAND  Result Value Ref Range Status   Specimen Description   Final    BLOOD LEFT HAND Performed at [place] [address] [place], [place] [address]    Special Requests   Final    BOTTLES DRAWN AEROBIC ONLY Blood Culture adequate volume Performed at [place] [address], [place], [place] [address]    Culture   Final    NO GROWTH 5 DAYS Performed at [facility], [place] [address] [place], [place] [address]    Report Status 12/19/2018 FINAL  Final  Culture, Urine     Status: None   Collection Time: 12/17/18  5:30 AM   Specimen: Urine, Clean Catch  Result Value Ref Range Status   Specimen Description   Final    URINE, CLEAN CATCH Performed at [facility], [place] [address] [place], [place] [address]    Special Requests   Final    NONE Performed at [facility], [place] [address] [place], [place] [address]    Culture   Final    NO GROWTH Performed at [facility], [place] [address], [place], [place] [address]    Report Status 12/18/2018 FINAL  Final  C difficile quick scan w PCR reflex     Status: None   Collection Time: 12/19/18  5:00 PM   Specimen: STOOL  Result Value Ref Range Status   C Diff antigen NEGATIVE NEGATIVE Final   C Diff toxin NEGATIVE NEGATIVE Final   C Diff interpretation No C. difficile detected.  Final    Comment: Performed  at [facility], [place] [address], [place],  [address]    Today   Subjective    Sharron Langsam today has no headache,no chest abdominal pain,no new weakness tingling or numbness, feels much better wants to go home today.     Objective   Blood pressure 137/81, pulse 88, temperature 98.9 F (37.2 C), temperature source Oral, resp. rate 14, height 5\' 9"  (1.753 m), weight 99 kg, SpO2 90 %.   Intake/Output  Summary (Last 24 hours) at 12/22/2018 0946 Last data filed at 12/22/2018 0910 Gross per 24 hour  Intake 120 ml  Output 1350 ml  Net -1230 ml    Exam Awake Alert, Oriented x 3, No new F.N deficits, Normal affect [place].AT,PERRAL Supple Neck,No JVD, No cervical lymphadenopathy appriciated.  Symmetrical Chest wall movement, Good air movement bilaterally, CTAB RRR,No Gallops,Rubs or new Murmurs, No Parasternal Heave +ve B.Sounds, Abd Soft, Non tender, No organomegaly appriciated, No rebound -guarding or rigidity. No Cyanosis, Clubbing or edema, No new Rash or bruise   Data Review   CBC w Diff:  Lab Results  Component Value Date   WBC 19.6 (H) 12/21/2018   HGB 11.4 (L) 12/21/2018   HCT 36.5 (L) 12/21/2018   PLT 462 (H) 12/21/2018   LYMPHOPCT 6 12/21/2018   MONOPCT 9 12/21/2018   EOSPCT 0 12/21/2018   BASOPCT 0 12/21/2018    CMP:  Lab Results  Component Value Date   NA 143 12/21/2018   K 4.0 12/21/2018   CL 106 12/21/2018   CO2 26 12/21/2018   BUN 46 (H) 12/21/2018   CREATININE 1.38 (H) 12/21/2018   PROT 5.5 (L) 12/21/2018   ALBUMIN 2.3 (L) 12/21/2018   BILITOT 0.3 12/21/2018   ALKPHOS 79 12/21/2018   AST 21 12/21/2018   ALT 21 12/21/2018  .   Total Time in preparing paper work, data evaluation and todays exam - 45 minutes  Lala Lund M.D on 12/22/2018 at 9:46 AM  [facility]   Office  [phone]

## 2018-12-22 NOTE — Progress Notes (Signed)
Occupational Therapy Treatment Patient Details Name: Derrick Bender MRN: [id number] DOB: [birth year] Today's Date: 12/22/2018    History of present illness Patient is a 73 y.o. male with PMHx including, DM-2, HTN, CVA, seizure disorder, prostate cancer s/p total prostatectomy. Presented to ED with persistent fever, acute hypoxemic respiratory failure (not typically on home O2) in the setting of COVID-19 pneumonia.   OT comments  Pt able to ambulate to sink and stand x 1 min before requesting to sit due to fatigue and increased WOB. Desat to 82 on 4L. @ 5 min for recovery to 90 - encouraging pursed lip breathing. Requires Mid A for ADL and min A for mobility. High fall risk. Will need 24/7 A and assistance with all mobility and ADL. Continue to recommend rehab at [facility] however pt refusing therefore will need [place] and [place].   Follow Up Recommendations  Supervision/Assistance - 24 hour;SNF    Equipment Recommendations  3 in 1 bedside commode    Recommendations for Other Services PT consult    Precautions / Restrictions Precautions Precautions: Fall Precaution Comments: monitor sats       Mobility Bed Mobility Overal bed mobility: Needs Assistance       Supine to sit: Min guard        Transfers Overall transfer level: Needs assistance   Transfers: Sit to/from Stand;Stand Pivot Transfers Sit to Stand: Min assist Stand pivot transfers: Min assist            Balance     Sitting balance-Leahy Scale: Good       Standing balance-Leahy Scale: Fair Standing balance comment: able to release RW to complete pericare                           ADL either performed or assessed with clinical judgement   ADL Overall ADL's : Needs assistance/impaired     Grooming: Set up;Sitting   Upper Body Bathing: Set up;Sitting   Lower Body Bathing: Moderate assistance;Sit to/from stand   Upper Body Dressing : Minimal assistance;Sitting   Lower Body Dressing: Moderate  assistance;Sit to/from stand   Toilet Transfer: Minimal assistance;RW   Toileting- Clothing Manipulation and Hygiene: Sit to/from stand;Minimal assistance       Functional mobility during ADLs: Minimal assistance;Rolling walker;Cueing for safety General ADL Comments: limited endurance  - only able to stand @ 1 -2 min before requesitng to sit     Vision       Perception     Praxis      Cognition Arousal/Alertness: Awake/alert Behavior During Therapy: Flat affect Overall Cognitive Status: Impaired/Different from baseline Area of Impairment: Orientation;Attention;Memory;Safety/judgement;Awareness;Problem solving                 Orientation Level: Disoriented to;Time Current Attention Level: Sustained Memory: Decreased short-term memory Following Commands: Follows one step commands consistently;Follows one step commands with increased time Safety/Judgement: Decreased awareness of safety;Decreased awareness of deficits Awareness: Emergent Problem Solving: Slow processing          Exercises Exercises: Other exercises Other Exercises Other Exercises: incentive spirometer x 10 - able to pull 750 ml   Shoulder Instructions       General Comments      Pertinent Vitals/ Pain       Pain Assessment: Faces Faces Pain Scale: Hurts little more Pain Location: hemorrhoids Pain Descriptors / Indicators: Grimacing;Sharp;Tender Pain Intervention(s): Limited activity within patient's tolerance;Repositioned  Home Living  Prior Functioning/Environment              Frequency  Min 3X/week        Progress Toward Goals  OT Goals(current goals can now be found in the care plan section)  Progress towards OT goals: Progressing toward goals  Acute Rehab OT Goals Patient Stated Goal: wants to go home OT Goal Formulation: With patient Time For Goal Achievement: 12/25/18 Potential to Achieve Goals: Good ADL  Goals Pt Will Perform Grooming: with supervision;standing Pt Will Perform Upper Body Dressing: with modified independence;sitting Pt Will Perform Lower Body Dressing: with supervision;sit to/from stand Pt Will Transfer to Toilet: with modified independence;ambulating Pt Will Perform Toileting - Clothing Manipulation and hygiene: with modified independence;sit to/from stand Pt/caregiver will Perform Home Exercise Program: Both right and left upper extremity;With theraband;Independently;With written HEP provided Additional ADL Goal #1: Pt will verbalized 3 energy conservation strategies with ADL at independent level  Plan Frequency remains appropriate;Discharge plan remains appropriate    Co-evaluation                 AM-PAC OT "6 Clicks" Daily Activity     Outcome Measure   Help from another person eating meals?: None Help from another person taking care of personal grooming?: A Little Help from another person toileting, which includes using toliet, bedpan, or urinal?: A Lot Help from another person bathing (including washing, rinsing, drying)?: A Lot Help from another person to put on and taking off regular upper body clothing?: A Little Help from another person to put on and taking off regular lower body clothing?: A Lot 6 Click Score: 16    End of Session Equipment Utilized During Treatment: Gait belt;Rolling walker;Oxygen(4L)  OT Visit Diagnosis: Unsteadiness on feet (R26.81);Other abnormalities of gait and mobility (R26.89);Muscle weakness (generalized) (M62.81)   Activity Tolerance Patient limited by fatigue   Patient Left in chair;with call bell/phone within reach   Nurse Communication Mobility status        Time: [id number] OT Time Calculation (min): 42 min  Charges: OT General Charges $OT Visit: 1 Visit OT Treatments $Self Care/Home Management : 38-52 mins  Maurie Boettcher, OT/L   Acute OT Clinical Specialist [place] Pager  [phone] Office [phone]    [facility] 12/22/2018, 1:57 PM

## 2018-12-22 NOTE — TOC Transition Note (Addendum)
Transition of Care [facility]) - CM/SW Discharge Note   Patient Details  Name: Derrick Bender MRN: [id number] Date of Birth: 1946/01/01  Transition of Care [facility]) CM/SW Contact:  Ninfa Meeker, RN Phone Number: [phone] (working remotely) 12/22/2018, 11:18 AM   Clinical Narrative:   73 yr old gentleman admitted and treated for COVID 19. Patient has declined SNF and will go home with oxygen and [place] services. Case Manager contacted Caryl Pina with [employer] to arrange for concentrator and tanks to be delivered to patient's home. Confirmed his address and who will be available to accept equipment. Patient's daughter, Vaughan Sine will meet with delivery driver and get portable tank for patient's transport home and then driver will meet them later at the home to arrange setup. CM confirmed that patient will have enough oxygen in portable tank until he gets home and was assured by Caryl Pina that the tank should las 6 hours and this will be completed before that. Caryl Pina will be arranging for the [place] or [place] branch to assume patient's service. Case manager has informed bedside nurse. Case manager contacted [facility] with referral for [facility], faxed demographics, DC summary, orders, and face to face to [phone]. CM spoke with the oncall RN, Patrici Ranks. Due to the holiday weekend [facility] will likely be Tuesday or Wednesday.     Final next level of care: [place] Barriers to Discharge: No Barriers Identified   Patient Goals and CMS Choice Patient states their goals for this hospitalization and ongoing recovery are:: get better   Choice offered to / list presented to : Spouse  Discharge Placement                       Discharge Plan and Services In-house Referral: Clinical Social Work Discharge Planning Services: CM Consult Post Acute Care Choice: Durable Medical Equipment, Home Health          DME Arranged: Gilford Rile rolling, 3-N-1 DME Agency: [place] Date DME Agency Contacted: 12/22/18 Time DME Agency Contacted: [id number] Representative spoke with at DME Agency: Learta Codding HH Arranged: PT, OT [facility] Agency: [facility] [place], [place]) Date [facility] Agency Contacted: 12/22/18 Time [place]: [id number] Representative spoke with at [place]: on call Nurse [place] (SDOH) Interventions     Readmission Risk Interventions No flowsheet data found.

## 2018-12-24 DIAGNOSIS — U071 COVID-19: Secondary | ICD-10-CM | POA: Diagnosis not present

## 2018-12-24 DIAGNOSIS — J440 Chronic obstructive pulmonary disease with acute lower respiratory infection: Secondary | ICD-10-CM | POA: Diagnosis not present

## 2018-12-24 DIAGNOSIS — J1289 Other viral pneumonia: Secondary | ICD-10-CM | POA: Diagnosis not present

## 2018-12-24 DIAGNOSIS — J449 Chronic obstructive pulmonary disease, unspecified: Secondary | ICD-10-CM | POA: Diagnosis not present

## 2018-12-24 DIAGNOSIS — I110 Hypertensive heart disease with heart failure: Secondary | ICD-10-CM | POA: Diagnosis not present

## 2018-12-24 DIAGNOSIS — E119 Type 2 diabetes mellitus without complications: Secondary | ICD-10-CM | POA: Diagnosis not present

## 2018-12-24 DIAGNOSIS — I5033 Acute on chronic diastolic (congestive) heart failure: Secondary | ICD-10-CM | POA: Diagnosis not present

## 2018-12-25 DIAGNOSIS — J440 Chronic obstructive pulmonary disease with acute lower respiratory infection: Secondary | ICD-10-CM | POA: Diagnosis not present

## 2018-12-25 DIAGNOSIS — I110 Hypertensive heart disease with heart failure: Secondary | ICD-10-CM | POA: Diagnosis not present

## 2018-12-25 DIAGNOSIS — U071 COVID-19: Secondary | ICD-10-CM | POA: Diagnosis not present

## 2018-12-25 DIAGNOSIS — E119 Type 2 diabetes mellitus without complications: Secondary | ICD-10-CM | POA: Diagnosis not present

## 2018-12-25 DIAGNOSIS — J1289 Other viral pneumonia: Secondary | ICD-10-CM | POA: Diagnosis not present

## 2018-12-25 DIAGNOSIS — I5033 Acute on chronic diastolic (congestive) heart failure: Secondary | ICD-10-CM | POA: Diagnosis not present

## 2018-12-30 DIAGNOSIS — I5033 Acute on chronic diastolic (congestive) heart failure: Secondary | ICD-10-CM | POA: Diagnosis not present

## 2018-12-30 DIAGNOSIS — Z09 Encounter for follow-up examination after completed treatment for conditions other than malignant neoplasm: Secondary | ICD-10-CM | POA: Diagnosis not present

## 2018-12-30 DIAGNOSIS — I110 Hypertensive heart disease with heart failure: Secondary | ICD-10-CM | POA: Diagnosis not present

## 2018-12-30 DIAGNOSIS — E1165 Type 2 diabetes mellitus with hyperglycemia: Secondary | ICD-10-CM | POA: Diagnosis not present

## 2018-12-30 DIAGNOSIS — U071 COVID-19: Secondary | ICD-10-CM | POA: Diagnosis not present

## 2018-12-30 DIAGNOSIS — J1289 Other viral pneumonia: Secondary | ICD-10-CM | POA: Diagnosis not present

## 2018-12-30 DIAGNOSIS — J440 Chronic obstructive pulmonary disease with acute lower respiratory infection: Secondary | ICD-10-CM | POA: Diagnosis not present

## 2018-12-30 DIAGNOSIS — Z6835 Body mass index (BMI) 35.0-35.9, adult: Secondary | ICD-10-CM | POA: Diagnosis not present

## 2018-12-30 DIAGNOSIS — Z299 Encounter for prophylactic measures, unspecified: Secondary | ICD-10-CM | POA: Diagnosis not present

## 2018-12-30 DIAGNOSIS — Z87891 Personal history of nicotine dependence: Secondary | ICD-10-CM | POA: Diagnosis not present

## 2018-12-30 DIAGNOSIS — E119 Type 2 diabetes mellitus without complications: Secondary | ICD-10-CM | POA: Diagnosis not present

## 2019-01-02 DIAGNOSIS — I5033 Acute on chronic diastolic (congestive) heart failure: Secondary | ICD-10-CM | POA: Diagnosis not present

## 2019-01-02 DIAGNOSIS — J440 Chronic obstructive pulmonary disease with acute lower respiratory infection: Secondary | ICD-10-CM | POA: Diagnosis not present

## 2019-01-02 DIAGNOSIS — U071 COVID-19: Secondary | ICD-10-CM | POA: Diagnosis not present

## 2019-01-02 DIAGNOSIS — E119 Type 2 diabetes mellitus without complications: Secondary | ICD-10-CM | POA: Diagnosis not present

## 2019-01-02 DIAGNOSIS — J1289 Other viral pneumonia: Secondary | ICD-10-CM | POA: Diagnosis not present

## 2019-01-02 DIAGNOSIS — I110 Hypertensive heart disease with heart failure: Secondary | ICD-10-CM | POA: Diagnosis not present

## 2019-01-06 DIAGNOSIS — I110 Hypertensive heart disease with heart failure: Secondary | ICD-10-CM | POA: Diagnosis not present

## 2019-01-06 DIAGNOSIS — U071 COVID-19: Secondary | ICD-10-CM | POA: Diagnosis not present

## 2019-01-06 DIAGNOSIS — J1289 Other viral pneumonia: Secondary | ICD-10-CM | POA: Diagnosis not present

## 2019-01-06 DIAGNOSIS — E119 Type 2 diabetes mellitus without complications: Secondary | ICD-10-CM | POA: Diagnosis not present

## 2019-01-06 DIAGNOSIS — I5033 Acute on chronic diastolic (congestive) heart failure: Secondary | ICD-10-CM | POA: Diagnosis not present

## 2019-01-06 DIAGNOSIS — J440 Chronic obstructive pulmonary disease with acute lower respiratory infection: Secondary | ICD-10-CM | POA: Diagnosis not present

## 2019-01-13 DIAGNOSIS — U071 COVID-19: Secondary | ICD-10-CM | POA: Diagnosis not present

## 2019-01-13 DIAGNOSIS — E119 Type 2 diabetes mellitus without complications: Secondary | ICD-10-CM | POA: Diagnosis not present

## 2019-01-13 DIAGNOSIS — J440 Chronic obstructive pulmonary disease with acute lower respiratory infection: Secondary | ICD-10-CM | POA: Diagnosis not present

## 2019-01-13 DIAGNOSIS — I5033 Acute on chronic diastolic (congestive) heart failure: Secondary | ICD-10-CM | POA: Diagnosis not present

## 2019-01-13 DIAGNOSIS — I110 Hypertensive heart disease with heart failure: Secondary | ICD-10-CM | POA: Diagnosis not present

## 2019-01-13 DIAGNOSIS — J1289 Other viral pneumonia: Secondary | ICD-10-CM | POA: Diagnosis not present

## 2019-01-14 DIAGNOSIS — U071 COVID-19: Secondary | ICD-10-CM | POA: Diagnosis not present

## 2019-01-14 DIAGNOSIS — E119 Type 2 diabetes mellitus without complications: Secondary | ICD-10-CM | POA: Diagnosis not present

## 2019-01-14 DIAGNOSIS — J1289 Other viral pneumonia: Secondary | ICD-10-CM | POA: Diagnosis not present

## 2019-01-14 DIAGNOSIS — J440 Chronic obstructive pulmonary disease with acute lower respiratory infection: Secondary | ICD-10-CM | POA: Diagnosis not present

## 2019-01-14 DIAGNOSIS — G4733 Obstructive sleep apnea (adult) (pediatric): Secondary | ICD-10-CM | POA: Diagnosis not present

## 2019-01-17 DIAGNOSIS — J449 Chronic obstructive pulmonary disease, unspecified: Secondary | ICD-10-CM | POA: Diagnosis not present

## 2019-01-17 DIAGNOSIS — G4733 Obstructive sleep apnea (adult) (pediatric): Secondary | ICD-10-CM | POA: Diagnosis not present

## 2019-01-17 DIAGNOSIS — R569 Unspecified convulsions: Secondary | ICD-10-CM | POA: Diagnosis not present

## 2019-01-17 DIAGNOSIS — Z6832 Body mass index (BMI) 32.0-32.9, adult: Secondary | ICD-10-CM | POA: Diagnosis not present

## 2019-01-17 DIAGNOSIS — E1165 Type 2 diabetes mellitus with hyperglycemia: Secondary | ICD-10-CM | POA: Diagnosis not present

## 2019-01-17 DIAGNOSIS — I10 Essential (primary) hypertension: Secondary | ICD-10-CM | POA: Diagnosis not present

## 2019-01-17 DIAGNOSIS — Z299 Encounter for prophylactic measures, unspecified: Secondary | ICD-10-CM | POA: Diagnosis not present

## 2019-01-17 DIAGNOSIS — F321 Major depressive disorder, single episode, moderate: Secondary | ICD-10-CM | POA: Diagnosis not present

## 2019-01-21 DIAGNOSIS — J449 Chronic obstructive pulmonary disease, unspecified: Secondary | ICD-10-CM | POA: Diagnosis not present

## 2019-01-21 DIAGNOSIS — E119 Type 2 diabetes mellitus without complications: Secondary | ICD-10-CM | POA: Diagnosis not present

## 2019-01-21 DIAGNOSIS — E7800 Pure hypercholesterolemia, unspecified: Secondary | ICD-10-CM | POA: Diagnosis not present

## 2019-01-21 DIAGNOSIS — I10 Essential (primary) hypertension: Secondary | ICD-10-CM | POA: Diagnosis not present

## 2019-01-23 DIAGNOSIS — I110 Hypertensive heart disease with heart failure: Secondary | ICD-10-CM | POA: Diagnosis not present

## 2019-01-23 DIAGNOSIS — E119 Type 2 diabetes mellitus without complications: Secondary | ICD-10-CM | POA: Diagnosis not present

## 2019-01-23 DIAGNOSIS — J1289 Other viral pneumonia: Secondary | ICD-10-CM | POA: Diagnosis not present

## 2019-01-23 DIAGNOSIS — I5033 Acute on chronic diastolic (congestive) heart failure: Secondary | ICD-10-CM | POA: Diagnosis not present

## 2019-01-23 DIAGNOSIS — J440 Chronic obstructive pulmonary disease with acute lower respiratory infection: Secondary | ICD-10-CM | POA: Diagnosis not present

## 2019-01-23 DIAGNOSIS — J449 Chronic obstructive pulmonary disease, unspecified: Secondary | ICD-10-CM | POA: Diagnosis not present

## 2019-01-23 DIAGNOSIS — U071 COVID-19: Secondary | ICD-10-CM | POA: Diagnosis not present

## 2019-01-24 DIAGNOSIS — F419 Anxiety disorder, unspecified: Secondary | ICD-10-CM | POA: Diagnosis not present

## 2019-01-24 DIAGNOSIS — R5383 Other fatigue: Secondary | ICD-10-CM | POA: Diagnosis not present

## 2019-01-24 DIAGNOSIS — Z1211 Encounter for screening for malignant neoplasm of colon: Secondary | ICD-10-CM | POA: Diagnosis not present

## 2019-01-24 DIAGNOSIS — Z1339 Encounter for screening examination for other mental health and behavioral disorders: Secondary | ICD-10-CM | POA: Diagnosis not present

## 2019-01-24 DIAGNOSIS — Z6832 Body mass index (BMI) 32.0-32.9, adult: Secondary | ICD-10-CM | POA: Diagnosis not present

## 2019-01-24 DIAGNOSIS — E1165 Type 2 diabetes mellitus with hyperglycemia: Secondary | ICD-10-CM | POA: Diagnosis not present

## 2019-01-24 DIAGNOSIS — Z299 Encounter for prophylactic measures, unspecified: Secondary | ICD-10-CM | POA: Diagnosis not present

## 2019-01-24 DIAGNOSIS — Z0000 Encounter for general adult medical examination without abnormal findings: Secondary | ICD-10-CM | POA: Diagnosis not present

## 2019-01-24 DIAGNOSIS — Z1331 Encounter for screening for depression: Secondary | ICD-10-CM | POA: Diagnosis not present

## 2019-01-24 DIAGNOSIS — Z79899 Other long term (current) drug therapy: Secondary | ICD-10-CM | POA: Diagnosis not present

## 2019-01-24 DIAGNOSIS — Z7189 Other specified counseling: Secondary | ICD-10-CM | POA: Diagnosis not present

## 2019-01-24 DIAGNOSIS — Z125 Encounter for screening for malignant neoplasm of prostate: Secondary | ICD-10-CM | POA: Diagnosis not present

## 2019-01-24 DIAGNOSIS — I10 Essential (primary) hypertension: Secondary | ICD-10-CM | POA: Diagnosis not present

## 2019-01-28 DIAGNOSIS — I10 Essential (primary) hypertension: Secondary | ICD-10-CM | POA: Diagnosis not present

## 2019-01-28 DIAGNOSIS — Z299 Encounter for prophylactic measures, unspecified: Secondary | ICD-10-CM | POA: Diagnosis not present

## 2019-01-28 DIAGNOSIS — J449 Chronic obstructive pulmonary disease, unspecified: Secondary | ICD-10-CM | POA: Diagnosis not present

## 2019-01-28 DIAGNOSIS — J440 Chronic obstructive pulmonary disease with acute lower respiratory infection: Secondary | ICD-10-CM | POA: Diagnosis not present

## 2019-01-28 DIAGNOSIS — J9611 Chronic respiratory failure with hypoxia: Secondary | ICD-10-CM | POA: Diagnosis not present

## 2019-01-28 DIAGNOSIS — J209 Acute bronchitis, unspecified: Secondary | ICD-10-CM | POA: Diagnosis not present

## 2019-01-28 DIAGNOSIS — Z6832 Body mass index (BMI) 32.0-32.9, adult: Secondary | ICD-10-CM | POA: Diagnosis not present

## 2019-02-04 DIAGNOSIS — I5033 Acute on chronic diastolic (congestive) heart failure: Secondary | ICD-10-CM | POA: Diagnosis not present

## 2019-02-04 DIAGNOSIS — I110 Hypertensive heart disease with heart failure: Secondary | ICD-10-CM | POA: Diagnosis not present

## 2019-02-04 DIAGNOSIS — U071 COVID-19: Secondary | ICD-10-CM | POA: Diagnosis not present

## 2019-02-04 DIAGNOSIS — E119 Type 2 diabetes mellitus without complications: Secondary | ICD-10-CM | POA: Diagnosis not present

## 2019-02-04 DIAGNOSIS — J440 Chronic obstructive pulmonary disease with acute lower respiratory infection: Secondary | ICD-10-CM | POA: Diagnosis not present

## 2019-02-04 DIAGNOSIS — J1289 Other viral pneumonia: Secondary | ICD-10-CM | POA: Diagnosis not present

## 2019-02-13 DIAGNOSIS — G4733 Obstructive sleep apnea (adult) (pediatric): Secondary | ICD-10-CM | POA: Diagnosis not present

## 2019-02-17 DIAGNOSIS — R569 Unspecified convulsions: Secondary | ICD-10-CM | POA: Diagnosis not present

## 2019-02-17 DIAGNOSIS — J069 Acute upper respiratory infection, unspecified: Secondary | ICD-10-CM | POA: Diagnosis not present

## 2019-02-17 DIAGNOSIS — J449 Chronic obstructive pulmonary disease, unspecified: Secondary | ICD-10-CM | POA: Diagnosis not present

## 2019-02-17 DIAGNOSIS — F321 Major depressive disorder, single episode, moderate: Secondary | ICD-10-CM | POA: Diagnosis not present

## 2019-02-17 DIAGNOSIS — Z299 Encounter for prophylactic measures, unspecified: Secondary | ICD-10-CM | POA: Diagnosis not present

## 2019-02-23 DIAGNOSIS — U071 COVID-19: Secondary | ICD-10-CM | POA: Diagnosis not present

## 2019-02-23 DIAGNOSIS — J449 Chronic obstructive pulmonary disease, unspecified: Secondary | ICD-10-CM | POA: Diagnosis not present

## 2019-03-04 DIAGNOSIS — G40209 Localization-related (focal) (partial) symptomatic epilepsy and epileptic syndromes with complex partial seizures, not intractable, without status epilepticus: Secondary | ICD-10-CM | POA: Diagnosis not present

## 2019-03-04 DIAGNOSIS — I6930 Unspecified sequelae of cerebral infarction: Secondary | ICD-10-CM | POA: Diagnosis not present

## 2019-03-04 DIAGNOSIS — G4733 Obstructive sleep apnea (adult) (pediatric): Secondary | ICD-10-CM | POA: Diagnosis not present

## 2019-03-07 DIAGNOSIS — J209 Acute bronchitis, unspecified: Secondary | ICD-10-CM | POA: Diagnosis not present

## 2019-03-07 DIAGNOSIS — Z6832 Body mass index (BMI) 32.0-32.9, adult: Secondary | ICD-10-CM | POA: Diagnosis not present

## 2019-03-07 DIAGNOSIS — J449 Chronic obstructive pulmonary disease, unspecified: Secondary | ICD-10-CM | POA: Diagnosis not present

## 2019-03-07 DIAGNOSIS — F321 Major depressive disorder, single episode, moderate: Secondary | ICD-10-CM | POA: Diagnosis not present

## 2019-03-07 DIAGNOSIS — Z299 Encounter for prophylactic measures, unspecified: Secondary | ICD-10-CM | POA: Diagnosis not present

## 2019-03-07 DIAGNOSIS — J440 Chronic obstructive pulmonary disease with acute lower respiratory infection: Secondary | ICD-10-CM | POA: Diagnosis not present

## 2019-03-12 DIAGNOSIS — J449 Chronic obstructive pulmonary disease, unspecified: Secondary | ICD-10-CM | POA: Diagnosis not present

## 2019-03-12 DIAGNOSIS — I10 Essential (primary) hypertension: Secondary | ICD-10-CM | POA: Diagnosis not present

## 2019-03-12 DIAGNOSIS — E7800 Pure hypercholesterolemia, unspecified: Secondary | ICD-10-CM | POA: Diagnosis not present

## 2019-03-12 DIAGNOSIS — E119 Type 2 diabetes mellitus without complications: Secondary | ICD-10-CM | POA: Diagnosis not present

## 2019-03-16 DIAGNOSIS — G4733 Obstructive sleep apnea (adult) (pediatric): Secondary | ICD-10-CM | POA: Diagnosis not present

## 2019-03-17 DIAGNOSIS — D497 Neoplasm of unspecified behavior of endocrine glands and other parts of nervous system: Secondary | ICD-10-CM | POA: Diagnosis not present

## 2019-03-17 DIAGNOSIS — I639 Cerebral infarction, unspecified: Secondary | ICD-10-CM | POA: Diagnosis not present

## 2019-03-20 DIAGNOSIS — D443 Neoplasm of uncertain behavior of pituitary gland: Secondary | ICD-10-CM | POA: Diagnosis not present

## 2019-03-20 DIAGNOSIS — Z87891 Personal history of nicotine dependence: Secondary | ICD-10-CM | POA: Diagnosis not present

## 2019-03-20 DIAGNOSIS — Z7902 Long term (current) use of antithrombotics/antiplatelets: Secondary | ICD-10-CM | POA: Diagnosis not present

## 2019-03-20 DIAGNOSIS — Z6831 Body mass index (BMI) 31.0-31.9, adult: Secondary | ICD-10-CM | POA: Diagnosis not present

## 2019-03-20 DIAGNOSIS — I4891 Unspecified atrial fibrillation: Secondary | ICD-10-CM | POA: Diagnosis not present

## 2019-03-20 DIAGNOSIS — G4733 Obstructive sleep apnea (adult) (pediatric): Secondary | ICD-10-CM | POA: Diagnosis not present

## 2019-03-20 DIAGNOSIS — E119 Type 2 diabetes mellitus without complications: Secondary | ICD-10-CM | POA: Diagnosis not present

## 2019-03-20 DIAGNOSIS — E785 Hyperlipidemia, unspecified: Secondary | ICD-10-CM | POA: Diagnosis not present

## 2019-03-20 DIAGNOSIS — Z8673 Personal history of transient ischemic attack (TIA), and cerebral infarction without residual deficits: Secondary | ICD-10-CM | POA: Diagnosis not present

## 2019-03-20 DIAGNOSIS — I10 Essential (primary) hypertension: Secondary | ICD-10-CM | POA: Diagnosis not present

## 2019-03-20 DIAGNOSIS — F418 Other specified anxiety disorders: Secondary | ICD-10-CM | POA: Diagnosis not present

## 2019-03-20 DIAGNOSIS — E669 Obesity, unspecified: Secondary | ICD-10-CM | POA: Diagnosis not present

## 2019-03-20 DIAGNOSIS — R569 Unspecified convulsions: Secondary | ICD-10-CM | POA: Diagnosis not present

## 2019-03-20 DIAGNOSIS — F1021 Alcohol dependence, in remission: Secondary | ICD-10-CM | POA: Diagnosis not present

## 2019-03-25 DIAGNOSIS — I10 Essential (primary) hypertension: Secondary | ICD-10-CM | POA: Diagnosis not present

## 2019-03-25 DIAGNOSIS — E7800 Pure hypercholesterolemia, unspecified: Secondary | ICD-10-CM | POA: Diagnosis not present

## 2019-03-25 DIAGNOSIS — J449 Chronic obstructive pulmonary disease, unspecified: Secondary | ICD-10-CM | POA: Diagnosis not present

## 2019-03-25 DIAGNOSIS — U071 COVID-19: Secondary | ICD-10-CM | POA: Diagnosis not present

## 2019-03-25 DIAGNOSIS — E119 Type 2 diabetes mellitus without complications: Secondary | ICD-10-CM | POA: Diagnosis not present

## 2019-04-15 DIAGNOSIS — G4733 Obstructive sleep apnea (adult) (pediatric): Secondary | ICD-10-CM | POA: Diagnosis not present

## 2019-04-25 DIAGNOSIS — U071 COVID-19: Secondary | ICD-10-CM | POA: Diagnosis not present

## 2019-04-25 DIAGNOSIS — G4733 Obstructive sleep apnea (adult) (pediatric): Secondary | ICD-10-CM | POA: Diagnosis not present

## 2019-04-25 DIAGNOSIS — J449 Chronic obstructive pulmonary disease, unspecified: Secondary | ICD-10-CM | POA: Diagnosis not present

## 2019-04-30 DIAGNOSIS — R195 Other fecal abnormalities: Secondary | ICD-10-CM | POA: Diagnosis not present

## 2019-04-30 DIAGNOSIS — J449 Chronic obstructive pulmonary disease, unspecified: Secondary | ICD-10-CM | POA: Diagnosis not present

## 2019-04-30 DIAGNOSIS — Z299 Encounter for prophylactic measures, unspecified: Secondary | ICD-10-CM | POA: Diagnosis not present

## 2019-04-30 DIAGNOSIS — K589 Irritable bowel syndrome without diarrhea: Secondary | ICD-10-CM | POA: Diagnosis not present

## 2019-04-30 DIAGNOSIS — Z6834 Body mass index (BMI) 34.0-34.9, adult: Secondary | ICD-10-CM | POA: Diagnosis not present

## 2019-04-30 DIAGNOSIS — F321 Major depressive disorder, single episode, moderate: Secondary | ICD-10-CM | POA: Diagnosis not present

## 2019-05-05 DIAGNOSIS — J449 Chronic obstructive pulmonary disease, unspecified: Secondary | ICD-10-CM | POA: Diagnosis not present

## 2019-05-05 DIAGNOSIS — F321 Major depressive disorder, single episode, moderate: Secondary | ICD-10-CM | POA: Diagnosis not present

## 2019-05-05 DIAGNOSIS — G4733 Obstructive sleep apnea (adult) (pediatric): Secondary | ICD-10-CM | POA: Diagnosis not present

## 2019-05-05 DIAGNOSIS — I10 Essential (primary) hypertension: Secondary | ICD-10-CM | POA: Diagnosis not present

## 2019-05-05 DIAGNOSIS — E1165 Type 2 diabetes mellitus with hyperglycemia: Secondary | ICD-10-CM | POA: Diagnosis not present

## 2019-05-05 DIAGNOSIS — Z87891 Personal history of nicotine dependence: Secondary | ICD-10-CM | POA: Diagnosis not present

## 2019-05-05 DIAGNOSIS — Z299 Encounter for prophylactic measures, unspecified: Secondary | ICD-10-CM | POA: Diagnosis not present

## 2019-05-05 DIAGNOSIS — Z6834 Body mass index (BMI) 34.0-34.9, adult: Secondary | ICD-10-CM | POA: Diagnosis not present

## 2019-05-13 DIAGNOSIS — D352 Benign neoplasm of pituitary gland: Secondary | ICD-10-CM | POA: Diagnosis not present

## 2019-05-13 DIAGNOSIS — G40209 Localization-related (focal) (partial) symptomatic epilepsy and epileptic syndromes with complex partial seizures, not intractable, without status epilepticus: Secondary | ICD-10-CM | POA: Diagnosis not present

## 2019-05-13 DIAGNOSIS — R519 Headache, unspecified: Secondary | ICD-10-CM | POA: Diagnosis not present

## 2019-05-16 DIAGNOSIS — J449 Chronic obstructive pulmonary disease, unspecified: Secondary | ICD-10-CM | POA: Diagnosis not present

## 2019-05-16 DIAGNOSIS — G4733 Obstructive sleep apnea (adult) (pediatric): Secondary | ICD-10-CM | POA: Diagnosis not present

## 2019-05-16 DIAGNOSIS — I10 Essential (primary) hypertension: Secondary | ICD-10-CM | POA: Diagnosis not present

## 2019-05-16 DIAGNOSIS — E119 Type 2 diabetes mellitus without complications: Secondary | ICD-10-CM | POA: Diagnosis not present

## 2019-05-16 DIAGNOSIS — E7800 Pure hypercholesterolemia, unspecified: Secondary | ICD-10-CM | POA: Diagnosis not present

## 2019-05-26 DIAGNOSIS — J449 Chronic obstructive pulmonary disease, unspecified: Secondary | ICD-10-CM | POA: Diagnosis not present

## 2019-05-26 DIAGNOSIS — U071 COVID-19: Secondary | ICD-10-CM | POA: Diagnosis not present

## 2019-06-13 DIAGNOSIS — J449 Chronic obstructive pulmonary disease, unspecified: Secondary | ICD-10-CM | POA: Diagnosis not present

## 2019-06-13 DIAGNOSIS — E119 Type 2 diabetes mellitus without complications: Secondary | ICD-10-CM | POA: Diagnosis not present

## 2019-06-13 DIAGNOSIS — I10 Essential (primary) hypertension: Secondary | ICD-10-CM | POA: Diagnosis not present

## 2019-06-13 DIAGNOSIS — E7800 Pure hypercholesterolemia, unspecified: Secondary | ICD-10-CM | POA: Diagnosis not present

## 2019-06-15 DIAGNOSIS — G4733 Obstructive sleep apnea (adult) (pediatric): Secondary | ICD-10-CM | POA: Diagnosis not present

## 2019-06-20 DIAGNOSIS — I10 Essential (primary) hypertension: Secondary | ICD-10-CM | POA: Diagnosis not present

## 2019-06-20 DIAGNOSIS — E119 Type 2 diabetes mellitus without complications: Secondary | ICD-10-CM | POA: Diagnosis not present

## 2019-06-20 DIAGNOSIS — J449 Chronic obstructive pulmonary disease, unspecified: Secondary | ICD-10-CM | POA: Diagnosis not present

## 2019-06-20 DIAGNOSIS — E7800 Pure hypercholesterolemia, unspecified: Secondary | ICD-10-CM | POA: Diagnosis not present

## 2019-06-23 DIAGNOSIS — U071 COVID-19: Secondary | ICD-10-CM | POA: Diagnosis not present

## 2019-06-23 DIAGNOSIS — J449 Chronic obstructive pulmonary disease, unspecified: Secondary | ICD-10-CM | POA: Diagnosis not present

## 2019-07-02 DIAGNOSIS — Z299 Encounter for prophylactic measures, unspecified: Secondary | ICD-10-CM | POA: Diagnosis not present

## 2019-07-02 DIAGNOSIS — J449 Chronic obstructive pulmonary disease, unspecified: Secondary | ICD-10-CM | POA: Diagnosis not present

## 2019-07-02 DIAGNOSIS — E1165 Type 2 diabetes mellitus with hyperglycemia: Secondary | ICD-10-CM | POA: Diagnosis not present

## 2019-07-02 DIAGNOSIS — Z6834 Body mass index (BMI) 34.0-34.9, adult: Secondary | ICD-10-CM | POA: Diagnosis not present

## 2019-07-02 DIAGNOSIS — D692 Other nonthrombocytopenic purpura: Secondary | ICD-10-CM | POA: Diagnosis not present

## 2019-07-02 DIAGNOSIS — F321 Major depressive disorder, single episode, moderate: Secondary | ICD-10-CM | POA: Diagnosis not present

## 2019-07-02 DIAGNOSIS — I10 Essential (primary) hypertension: Secondary | ICD-10-CM | POA: Diagnosis not present

## 2019-07-14 DIAGNOSIS — G4733 Obstructive sleep apnea (adult) (pediatric): Secondary | ICD-10-CM | POA: Diagnosis not present

## 2019-07-24 DIAGNOSIS — J449 Chronic obstructive pulmonary disease, unspecified: Secondary | ICD-10-CM | POA: Diagnosis not present

## 2019-07-24 DIAGNOSIS — U071 COVID-19: Secondary | ICD-10-CM | POA: Diagnosis not present

## 2019-08-07 DIAGNOSIS — J209 Acute bronchitis, unspecified: Secondary | ICD-10-CM | POA: Diagnosis not present

## 2019-08-07 DIAGNOSIS — E1165 Type 2 diabetes mellitus with hyperglycemia: Secondary | ICD-10-CM | POA: Diagnosis not present

## 2019-08-07 DIAGNOSIS — J449 Chronic obstructive pulmonary disease, unspecified: Secondary | ICD-10-CM | POA: Diagnosis not present

## 2019-08-07 DIAGNOSIS — J440 Chronic obstructive pulmonary disease with acute lower respiratory infection: Secondary | ICD-10-CM | POA: Diagnosis not present

## 2019-08-07 DIAGNOSIS — Z299 Encounter for prophylactic measures, unspecified: Secondary | ICD-10-CM | POA: Diagnosis not present

## 2019-08-08 DIAGNOSIS — F418 Other specified anxiety disorders: Secondary | ICD-10-CM | POA: Diagnosis not present

## 2019-08-08 DIAGNOSIS — E669 Obesity, unspecified: Secondary | ICD-10-CM | POA: Diagnosis not present

## 2019-08-08 DIAGNOSIS — E785 Hyperlipidemia, unspecified: Secondary | ICD-10-CM | POA: Diagnosis not present

## 2019-08-08 DIAGNOSIS — Z87891 Personal history of nicotine dependence: Secondary | ICD-10-CM | POA: Diagnosis not present

## 2019-08-08 DIAGNOSIS — R413 Other amnesia: Secondary | ICD-10-CM | POA: Diagnosis not present

## 2019-08-08 DIAGNOSIS — R569 Unspecified convulsions: Secondary | ICD-10-CM | POA: Diagnosis not present

## 2019-08-08 DIAGNOSIS — G4700 Insomnia, unspecified: Secondary | ICD-10-CM | POA: Diagnosis not present

## 2019-08-08 DIAGNOSIS — M6281 Muscle weakness (generalized): Secondary | ICD-10-CM | POA: Diagnosis not present

## 2019-08-08 DIAGNOSIS — I208 Other forms of angina pectoris: Secondary | ICD-10-CM | POA: Diagnosis not present

## 2019-08-08 DIAGNOSIS — E1165 Type 2 diabetes mellitus with hyperglycemia: Secondary | ICD-10-CM | POA: Diagnosis not present

## 2019-08-08 DIAGNOSIS — Z8546 Personal history of malignant neoplasm of prostate: Secondary | ICD-10-CM | POA: Diagnosis not present

## 2019-08-08 DIAGNOSIS — I10 Essential (primary) hypertension: Secondary | ICD-10-CM | POA: Diagnosis not present

## 2019-08-08 DIAGNOSIS — N3281 Overactive bladder: Secondary | ICD-10-CM | POA: Diagnosis not present

## 2019-08-08 DIAGNOSIS — F325 Major depressive disorder, single episode, in full remission: Secondary | ICD-10-CM | POA: Diagnosis not present

## 2019-08-08 DIAGNOSIS — K589 Irritable bowel syndrome without diarrhea: Secondary | ICD-10-CM | POA: Diagnosis not present

## 2019-08-08 DIAGNOSIS — G4733 Obstructive sleep apnea (adult) (pediatric): Secondary | ICD-10-CM | POA: Diagnosis not present

## 2019-08-08 DIAGNOSIS — L299 Pruritus, unspecified: Secondary | ICD-10-CM | POA: Diagnosis not present

## 2019-08-11 DIAGNOSIS — Z299 Encounter for prophylactic measures, unspecified: Secondary | ICD-10-CM | POA: Diagnosis not present

## 2019-08-11 DIAGNOSIS — J449 Chronic obstructive pulmonary disease, unspecified: Secondary | ICD-10-CM | POA: Diagnosis not present

## 2019-08-11 DIAGNOSIS — F321 Major depressive disorder, single episode, moderate: Secondary | ICD-10-CM | POA: Diagnosis not present

## 2019-08-11 DIAGNOSIS — E1165 Type 2 diabetes mellitus with hyperglycemia: Secondary | ICD-10-CM | POA: Diagnosis not present

## 2019-08-11 DIAGNOSIS — I10 Essential (primary) hypertension: Secondary | ICD-10-CM | POA: Diagnosis not present

## 2019-08-11 DIAGNOSIS — J440 Chronic obstructive pulmonary disease with acute lower respiratory infection: Secondary | ICD-10-CM | POA: Diagnosis not present

## 2019-08-14 DIAGNOSIS — G4733 Obstructive sleep apnea (adult) (pediatric): Secondary | ICD-10-CM | POA: Diagnosis not present

## 2019-08-23 DIAGNOSIS — U071 COVID-19: Secondary | ICD-10-CM | POA: Diagnosis not present

## 2019-08-23 DIAGNOSIS — J449 Chronic obstructive pulmonary disease, unspecified: Secondary | ICD-10-CM | POA: Diagnosis not present

## 2019-09-13 DIAGNOSIS — G4733 Obstructive sleep apnea (adult) (pediatric): Secondary | ICD-10-CM | POA: Diagnosis not present

## 2019-09-14 DIAGNOSIS — E119 Type 2 diabetes mellitus without complications: Secondary | ICD-10-CM | POA: Diagnosis not present

## 2019-09-14 DIAGNOSIS — I10 Essential (primary) hypertension: Secondary | ICD-10-CM | POA: Diagnosis not present

## 2019-09-14 DIAGNOSIS — E7800 Pure hypercholesterolemia, unspecified: Secondary | ICD-10-CM | POA: Diagnosis not present

## 2019-09-14 DIAGNOSIS — J449 Chronic obstructive pulmonary disease, unspecified: Secondary | ICD-10-CM | POA: Diagnosis not present

## 2019-09-23 DIAGNOSIS — U071 COVID-19: Secondary | ICD-10-CM | POA: Diagnosis not present

## 2019-09-23 DIAGNOSIS — J449 Chronic obstructive pulmonary disease, unspecified: Secondary | ICD-10-CM | POA: Diagnosis not present

## 2019-10-08 DIAGNOSIS — Z299 Encounter for prophylactic measures, unspecified: Secondary | ICD-10-CM | POA: Diagnosis not present

## 2019-10-08 DIAGNOSIS — I10 Essential (primary) hypertension: Secondary | ICD-10-CM | POA: Diagnosis not present

## 2019-10-08 DIAGNOSIS — J019 Acute sinusitis, unspecified: Secondary | ICD-10-CM | POA: Diagnosis not present

## 2019-10-08 DIAGNOSIS — J449 Chronic obstructive pulmonary disease, unspecified: Secondary | ICD-10-CM | POA: Diagnosis not present

## 2019-10-08 DIAGNOSIS — E1165 Type 2 diabetes mellitus with hyperglycemia: Secondary | ICD-10-CM | POA: Diagnosis not present

## 2019-10-08 DIAGNOSIS — E119 Type 2 diabetes mellitus without complications: Secondary | ICD-10-CM | POA: Diagnosis not present

## 2019-10-08 DIAGNOSIS — Z87891 Personal history of nicotine dependence: Secondary | ICD-10-CM | POA: Diagnosis not present

## 2019-10-14 DIAGNOSIS — G4733 Obstructive sleep apnea (adult) (pediatric): Secondary | ICD-10-CM | POA: Diagnosis not present

## 2019-10-15 DIAGNOSIS — E7800 Pure hypercholesterolemia, unspecified: Secondary | ICD-10-CM | POA: Diagnosis not present

## 2019-10-15 DIAGNOSIS — E119 Type 2 diabetes mellitus without complications: Secondary | ICD-10-CM | POA: Diagnosis not present

## 2019-10-15 DIAGNOSIS — J449 Chronic obstructive pulmonary disease, unspecified: Secondary | ICD-10-CM | POA: Diagnosis not present

## 2019-10-15 DIAGNOSIS — I10 Essential (primary) hypertension: Secondary | ICD-10-CM | POA: Diagnosis not present

## 2019-10-23 DIAGNOSIS — J449 Chronic obstructive pulmonary disease, unspecified: Secondary | ICD-10-CM | POA: Diagnosis not present

## 2019-10-23 DIAGNOSIS — U071 COVID-19: Secondary | ICD-10-CM | POA: Diagnosis not present

## 2019-10-27 DIAGNOSIS — F419 Anxiety disorder, unspecified: Secondary | ICD-10-CM | POA: Diagnosis not present

## 2019-10-27 DIAGNOSIS — J449 Chronic obstructive pulmonary disease, unspecified: Secondary | ICD-10-CM | POA: Diagnosis not present

## 2019-10-27 DIAGNOSIS — J069 Acute upper respiratory infection, unspecified: Secondary | ICD-10-CM | POA: Diagnosis not present

## 2019-10-27 DIAGNOSIS — F321 Major depressive disorder, single episode, moderate: Secondary | ICD-10-CM | POA: Diagnosis not present

## 2019-10-27 DIAGNOSIS — Z299 Encounter for prophylactic measures, unspecified: Secondary | ICD-10-CM | POA: Diagnosis not present

## 2019-11-13 DIAGNOSIS — F321 Major depressive disorder, single episode, moderate: Secondary | ICD-10-CM | POA: Diagnosis not present

## 2019-11-13 DIAGNOSIS — R042 Hemoptysis: Secondary | ICD-10-CM | POA: Diagnosis not present

## 2019-11-13 DIAGNOSIS — E1165 Type 2 diabetes mellitus with hyperglycemia: Secondary | ICD-10-CM | POA: Diagnosis not present

## 2019-11-13 DIAGNOSIS — G4733 Obstructive sleep apnea (adult) (pediatric): Secondary | ICD-10-CM | POA: Diagnosis not present

## 2019-11-13 DIAGNOSIS — Z6834 Body mass index (BMI) 34.0-34.9, adult: Secondary | ICD-10-CM | POA: Diagnosis not present

## 2019-11-13 DIAGNOSIS — J449 Chronic obstructive pulmonary disease, unspecified: Secondary | ICD-10-CM | POA: Diagnosis not present

## 2019-11-13 DIAGNOSIS — Z299 Encounter for prophylactic measures, unspecified: Secondary | ICD-10-CM | POA: Diagnosis not present

## 2019-11-13 DIAGNOSIS — I10 Essential (primary) hypertension: Secondary | ICD-10-CM | POA: Diagnosis not present

## 2019-11-14 DIAGNOSIS — I10 Essential (primary) hypertension: Secondary | ICD-10-CM | POA: Diagnosis not present

## 2019-11-14 DIAGNOSIS — E7800 Pure hypercholesterolemia, unspecified: Secondary | ICD-10-CM | POA: Diagnosis not present

## 2019-11-14 DIAGNOSIS — E119 Type 2 diabetes mellitus without complications: Secondary | ICD-10-CM | POA: Diagnosis not present

## 2019-11-14 DIAGNOSIS — J449 Chronic obstructive pulmonary disease, unspecified: Secondary | ICD-10-CM | POA: Diagnosis not present

## 2019-11-18 DIAGNOSIS — E119 Type 2 diabetes mellitus without complications: Secondary | ICD-10-CM | POA: Diagnosis not present

## 2019-11-18 DIAGNOSIS — E7800 Pure hypercholesterolemia, unspecified: Secondary | ICD-10-CM | POA: Diagnosis not present

## 2019-11-18 DIAGNOSIS — J449 Chronic obstructive pulmonary disease, unspecified: Secondary | ICD-10-CM | POA: Diagnosis not present

## 2019-11-18 DIAGNOSIS — I10 Essential (primary) hypertension: Secondary | ICD-10-CM | POA: Diagnosis not present

## 2019-11-19 DIAGNOSIS — Z299 Encounter for prophylactic measures, unspecified: Secondary | ICD-10-CM | POA: Diagnosis not present

## 2019-11-19 DIAGNOSIS — M47816 Spondylosis without myelopathy or radiculopathy, lumbar region: Secondary | ICD-10-CM | POA: Diagnosis not present

## 2019-11-19 DIAGNOSIS — F321 Major depressive disorder, single episode, moderate: Secondary | ICD-10-CM | POA: Diagnosis not present

## 2019-11-19 DIAGNOSIS — R109 Unspecified abdominal pain: Secondary | ICD-10-CM | POA: Diagnosis not present

## 2019-11-19 DIAGNOSIS — Z6834 Body mass index (BMI) 34.0-34.9, adult: Secondary | ICD-10-CM | POA: Diagnosis not present

## 2019-11-19 DIAGNOSIS — I10 Essential (primary) hypertension: Secondary | ICD-10-CM | POA: Diagnosis not present

## 2019-11-19 DIAGNOSIS — E1165 Type 2 diabetes mellitus with hyperglycemia: Secondary | ICD-10-CM | POA: Diagnosis not present

## 2019-11-19 DIAGNOSIS — J449 Chronic obstructive pulmonary disease, unspecified: Secondary | ICD-10-CM | POA: Diagnosis not present

## 2019-11-19 DIAGNOSIS — M47817 Spondylosis without myelopathy or radiculopathy, lumbosacral region: Secondary | ICD-10-CM | POA: Diagnosis not present

## 2019-11-23 DIAGNOSIS — J449 Chronic obstructive pulmonary disease, unspecified: Secondary | ICD-10-CM | POA: Diagnosis not present

## 2019-11-23 DIAGNOSIS — U071 COVID-19: Secondary | ICD-10-CM | POA: Diagnosis not present

## 2019-11-27 DIAGNOSIS — I700 Atherosclerosis of aorta: Secondary | ICD-10-CM | POA: Diagnosis not present

## 2019-11-27 DIAGNOSIS — R0602 Shortness of breath: Secondary | ICD-10-CM | POA: Diagnosis not present

## 2019-11-27 DIAGNOSIS — I2510 Atherosclerotic heart disease of native coronary artery without angina pectoris: Secondary | ICD-10-CM | POA: Diagnosis not present

## 2019-11-27 DIAGNOSIS — J984 Other disorders of lung: Secondary | ICD-10-CM | POA: Diagnosis not present

## 2020-01-15 DIAGNOSIS — E7800 Pure hypercholesterolemia, unspecified: Secondary | ICD-10-CM | POA: Diagnosis not present

## 2020-01-15 DIAGNOSIS — E119 Type 2 diabetes mellitus without complications: Secondary | ICD-10-CM | POA: Diagnosis not present

## 2020-01-15 DIAGNOSIS — I10 Essential (primary) hypertension: Secondary | ICD-10-CM | POA: Diagnosis not present

## 2020-01-15 DIAGNOSIS — J449 Chronic obstructive pulmonary disease, unspecified: Secondary | ICD-10-CM | POA: Diagnosis not present

## 2020-01-20 DIAGNOSIS — J449 Chronic obstructive pulmonary disease, unspecified: Secondary | ICD-10-CM | POA: Diagnosis not present

## 2020-01-20 DIAGNOSIS — Z6835 Body mass index (BMI) 35.0-35.9, adult: Secondary | ICD-10-CM | POA: Diagnosis not present

## 2020-01-20 DIAGNOSIS — E1165 Type 2 diabetes mellitus with hyperglycemia: Secondary | ICD-10-CM | POA: Diagnosis not present

## 2020-01-20 DIAGNOSIS — Z299 Encounter for prophylactic measures, unspecified: Secondary | ICD-10-CM | POA: Diagnosis not present

## 2020-01-20 DIAGNOSIS — M545 Low back pain, unspecified: Secondary | ICD-10-CM | POA: Diagnosis not present

## 2020-01-20 DIAGNOSIS — F321 Major depressive disorder, single episode, moderate: Secondary | ICD-10-CM | POA: Diagnosis not present

## 2020-01-20 DIAGNOSIS — I10 Essential (primary) hypertension: Secondary | ICD-10-CM | POA: Diagnosis not present

## 2020-01-30 DIAGNOSIS — E7800 Pure hypercholesterolemia, unspecified: Secondary | ICD-10-CM | POA: Diagnosis not present

## 2020-01-30 DIAGNOSIS — Z1339 Encounter for screening examination for other mental health and behavioral disorders: Secondary | ICD-10-CM | POA: Diagnosis not present

## 2020-01-30 DIAGNOSIS — Z79899 Other long term (current) drug therapy: Secondary | ICD-10-CM | POA: Diagnosis not present

## 2020-01-30 DIAGNOSIS — Z7189 Other specified counseling: Secondary | ICD-10-CM | POA: Diagnosis not present

## 2020-01-30 DIAGNOSIS — Z0000 Encounter for general adult medical examination without abnormal findings: Secondary | ICD-10-CM | POA: Diagnosis not present

## 2020-01-30 DIAGNOSIS — R5383 Other fatigue: Secondary | ICD-10-CM | POA: Diagnosis not present

## 2020-01-30 DIAGNOSIS — Z6835 Body mass index (BMI) 35.0-35.9, adult: Secondary | ICD-10-CM | POA: Diagnosis not present

## 2020-01-30 DIAGNOSIS — I10 Essential (primary) hypertension: Secondary | ICD-10-CM | POA: Diagnosis not present

## 2020-01-30 DIAGNOSIS — Z299 Encounter for prophylactic measures, unspecified: Secondary | ICD-10-CM | POA: Diagnosis not present

## 2020-01-30 DIAGNOSIS — Z1331 Encounter for screening for depression: Secondary | ICD-10-CM | POA: Diagnosis not present

## 2020-02-13 DIAGNOSIS — J449 Chronic obstructive pulmonary disease, unspecified: Secondary | ICD-10-CM | POA: Diagnosis not present

## 2020-02-13 DIAGNOSIS — E7800 Pure hypercholesterolemia, unspecified: Secondary | ICD-10-CM | POA: Diagnosis not present

## 2020-02-13 DIAGNOSIS — I10 Essential (primary) hypertension: Secondary | ICD-10-CM | POA: Diagnosis not present

## 2020-02-13 DIAGNOSIS — E119 Type 2 diabetes mellitus without complications: Secondary | ICD-10-CM | POA: Diagnosis not present

## 2020-02-23 DIAGNOSIS — F321 Major depressive disorder, single episode, moderate: Secondary | ICD-10-CM | POA: Diagnosis not present

## 2020-02-23 DIAGNOSIS — Z299 Encounter for prophylactic measures, unspecified: Secondary | ICD-10-CM | POA: Diagnosis not present

## 2020-02-23 DIAGNOSIS — E1165 Type 2 diabetes mellitus with hyperglycemia: Secondary | ICD-10-CM | POA: Diagnosis not present

## 2020-02-23 DIAGNOSIS — J449 Chronic obstructive pulmonary disease, unspecified: Secondary | ICD-10-CM | POA: Diagnosis not present

## 2020-03-16 DIAGNOSIS — E7800 Pure hypercholesterolemia, unspecified: Secondary | ICD-10-CM | POA: Diagnosis not present

## 2020-03-16 DIAGNOSIS — J449 Chronic obstructive pulmonary disease, unspecified: Secondary | ICD-10-CM | POA: Diagnosis not present

## 2020-03-16 DIAGNOSIS — I10 Essential (primary) hypertension: Secondary | ICD-10-CM | POA: Diagnosis not present

## 2020-03-16 DIAGNOSIS — E119 Type 2 diabetes mellitus without complications: Secondary | ICD-10-CM | POA: Diagnosis not present

## 2020-03-17 DIAGNOSIS — F321 Major depressive disorder, single episode, moderate: Secondary | ICD-10-CM | POA: Diagnosis not present

## 2020-03-17 DIAGNOSIS — J449 Chronic obstructive pulmonary disease, unspecified: Secondary | ICD-10-CM | POA: Diagnosis not present

## 2020-03-17 DIAGNOSIS — E1165 Type 2 diabetes mellitus with hyperglycemia: Secondary | ICD-10-CM | POA: Diagnosis not present

## 2020-03-17 DIAGNOSIS — I10 Essential (primary) hypertension: Secondary | ICD-10-CM | POA: Diagnosis not present

## 2020-03-17 DIAGNOSIS — Z299 Encounter for prophylactic measures, unspecified: Secondary | ICD-10-CM | POA: Diagnosis not present

## 2020-04-01 DIAGNOSIS — Z299 Encounter for prophylactic measures, unspecified: Secondary | ICD-10-CM | POA: Diagnosis not present

## 2020-04-01 DIAGNOSIS — J449 Chronic obstructive pulmonary disease, unspecified: Secondary | ICD-10-CM | POA: Diagnosis not present

## 2020-04-01 DIAGNOSIS — J069 Acute upper respiratory infection, unspecified: Secondary | ICD-10-CM | POA: Diagnosis not present

## 2020-04-01 DIAGNOSIS — R569 Unspecified convulsions: Secondary | ICD-10-CM | POA: Diagnosis not present

## 2020-04-01 DIAGNOSIS — E1165 Type 2 diabetes mellitus with hyperglycemia: Secondary | ICD-10-CM | POA: Diagnosis not present

## 2020-04-16 DIAGNOSIS — E1165 Type 2 diabetes mellitus with hyperglycemia: Secondary | ICD-10-CM | POA: Diagnosis not present

## 2020-04-16 DIAGNOSIS — J449 Chronic obstructive pulmonary disease, unspecified: Secondary | ICD-10-CM | POA: Diagnosis not present

## 2020-04-16 DIAGNOSIS — Z720 Tobacco use: Secondary | ICD-10-CM | POA: Diagnosis not present

## 2020-05-07 DIAGNOSIS — J449 Chronic obstructive pulmonary disease, unspecified: Secondary | ICD-10-CM | POA: Diagnosis not present

## 2020-05-07 DIAGNOSIS — J440 Chronic obstructive pulmonary disease with acute lower respiratory infection: Secondary | ICD-10-CM | POA: Diagnosis not present

## 2020-05-07 DIAGNOSIS — E1165 Type 2 diabetes mellitus with hyperglycemia: Secondary | ICD-10-CM | POA: Diagnosis not present

## 2020-05-07 DIAGNOSIS — Z299 Encounter for prophylactic measures, unspecified: Secondary | ICD-10-CM | POA: Diagnosis not present

## 2020-05-07 DIAGNOSIS — J209 Acute bronchitis, unspecified: Secondary | ICD-10-CM | POA: Diagnosis not present

## 2020-06-02 DIAGNOSIS — J449 Chronic obstructive pulmonary disease, unspecified: Secondary | ICD-10-CM | POA: Diagnosis not present

## 2020-06-02 DIAGNOSIS — E1165 Type 2 diabetes mellitus with hyperglycemia: Secondary | ICD-10-CM | POA: Diagnosis not present

## 2020-06-02 DIAGNOSIS — I10 Essential (primary) hypertension: Secondary | ICD-10-CM | POA: Diagnosis not present

## 2020-06-02 DIAGNOSIS — J069 Acute upper respiratory infection, unspecified: Secondary | ICD-10-CM | POA: Diagnosis not present

## 2020-06-02 DIAGNOSIS — F321 Major depressive disorder, single episode, moderate: Secondary | ICD-10-CM | POA: Diagnosis not present

## 2020-06-02 DIAGNOSIS — Z299 Encounter for prophylactic measures, unspecified: Secondary | ICD-10-CM | POA: Diagnosis not present

## 2020-08-02 DIAGNOSIS — R200 Anesthesia of skin: Secondary | ICD-10-CM | POA: Diagnosis not present

## 2020-08-02 DIAGNOSIS — D692 Other nonthrombocytopenic purpura: Secondary | ICD-10-CM | POA: Diagnosis not present

## 2020-08-02 DIAGNOSIS — I10 Essential (primary) hypertension: Secondary | ICD-10-CM | POA: Diagnosis not present

## 2020-08-02 DIAGNOSIS — Z299 Encounter for prophylactic measures, unspecified: Secondary | ICD-10-CM | POA: Diagnosis not present

## 2020-08-14 DIAGNOSIS — J449 Chronic obstructive pulmonary disease, unspecified: Secondary | ICD-10-CM | POA: Diagnosis not present

## 2020-08-14 DIAGNOSIS — E1165 Type 2 diabetes mellitus with hyperglycemia: Secondary | ICD-10-CM | POA: Diagnosis not present

## 2020-08-14 DIAGNOSIS — Z720 Tobacco use: Secondary | ICD-10-CM | POA: Diagnosis not present

## 2020-09-08 DIAGNOSIS — J440 Chronic obstructive pulmonary disease with acute lower respiratory infection: Secondary | ICD-10-CM | POA: Diagnosis not present

## 2020-09-08 DIAGNOSIS — I10 Essential (primary) hypertension: Secondary | ICD-10-CM | POA: Diagnosis not present

## 2020-09-08 DIAGNOSIS — Z299 Encounter for prophylactic measures, unspecified: Secondary | ICD-10-CM | POA: Diagnosis not present

## 2020-09-08 DIAGNOSIS — R197 Diarrhea, unspecified: Secondary | ICD-10-CM | POA: Diagnosis not present

## 2020-09-08 DIAGNOSIS — J209 Acute bronchitis, unspecified: Secondary | ICD-10-CM | POA: Diagnosis not present

## 2020-09-18 DIAGNOSIS — R0989 Other specified symptoms and signs involving the circulatory and respiratory systems: Secondary | ICD-10-CM | POA: Diagnosis not present

## 2020-09-18 DIAGNOSIS — R0682 Tachypnea, not elsewhere classified: Secondary | ICD-10-CM | POA: Diagnosis not present

## 2020-09-18 DIAGNOSIS — R059 Cough, unspecified: Secondary | ICD-10-CM | POA: Diagnosis not present

## 2020-09-18 DIAGNOSIS — R0600 Dyspnea, unspecified: Secondary | ICD-10-CM | POA: Diagnosis not present

## 2020-10-05 DIAGNOSIS — J209 Acute bronchitis, unspecified: Secondary | ICD-10-CM | POA: Diagnosis not present

## 2020-10-05 DIAGNOSIS — R059 Cough, unspecified: Secondary | ICD-10-CM | POA: Diagnosis not present

## 2020-10-05 DIAGNOSIS — Z299 Encounter for prophylactic measures, unspecified: Secondary | ICD-10-CM | POA: Diagnosis not present

## 2020-10-05 DIAGNOSIS — J440 Chronic obstructive pulmonary disease with acute lower respiratory infection: Secondary | ICD-10-CM | POA: Diagnosis not present

## 2020-10-05 DIAGNOSIS — E1165 Type 2 diabetes mellitus with hyperglycemia: Secondary | ICD-10-CM | POA: Diagnosis not present

## 2020-10-05 DIAGNOSIS — I10 Essential (primary) hypertension: Secondary | ICD-10-CM | POA: Diagnosis not present

## 2020-10-05 DIAGNOSIS — R0602 Shortness of breath: Secondary | ICD-10-CM | POA: Diagnosis not present

## 2020-10-05 DIAGNOSIS — I517 Cardiomegaly: Secondary | ICD-10-CM | POA: Diagnosis not present

## 2020-10-05 DIAGNOSIS — J441 Chronic obstructive pulmonary disease with (acute) exacerbation: Secondary | ICD-10-CM | POA: Diagnosis not present

## 2020-10-05 DIAGNOSIS — Z6833 Body mass index (BMI) 33.0-33.9, adult: Secondary | ICD-10-CM | POA: Diagnosis not present

## 2020-10-14 DIAGNOSIS — J449 Chronic obstructive pulmonary disease, unspecified: Secondary | ICD-10-CM | POA: Diagnosis not present

## 2020-10-14 DIAGNOSIS — E1165 Type 2 diabetes mellitus with hyperglycemia: Secondary | ICD-10-CM | POA: Diagnosis not present

## 2020-10-14 DIAGNOSIS — Z720 Tobacco use: Secondary | ICD-10-CM | POA: Diagnosis not present

## 2020-10-19 DIAGNOSIS — Z6833 Body mass index (BMI) 33.0-33.9, adult: Secondary | ICD-10-CM | POA: Diagnosis not present

## 2020-10-19 DIAGNOSIS — Z299 Encounter for prophylactic measures, unspecified: Secondary | ICD-10-CM | POA: Diagnosis not present

## 2020-10-19 DIAGNOSIS — J440 Chronic obstructive pulmonary disease with acute lower respiratory infection: Secondary | ICD-10-CM | POA: Diagnosis not present

## 2020-10-19 DIAGNOSIS — E1165 Type 2 diabetes mellitus with hyperglycemia: Secondary | ICD-10-CM | POA: Diagnosis not present

## 2020-10-19 DIAGNOSIS — Z789 Other specified health status: Secondary | ICD-10-CM | POA: Diagnosis not present

## 2020-10-19 DIAGNOSIS — J209 Acute bronchitis, unspecified: Secondary | ICD-10-CM | POA: Diagnosis not present

## 2020-10-19 DIAGNOSIS — Z9189 Other specified personal risk factors, not elsewhere classified: Secondary | ICD-10-CM | POA: Diagnosis not present

## 2020-11-14 DIAGNOSIS — E1165 Type 2 diabetes mellitus with hyperglycemia: Secondary | ICD-10-CM | POA: Diagnosis not present

## 2020-11-14 DIAGNOSIS — J449 Chronic obstructive pulmonary disease, unspecified: Secondary | ICD-10-CM | POA: Diagnosis not present

## 2020-11-14 DIAGNOSIS — Z720 Tobacco use: Secondary | ICD-10-CM | POA: Diagnosis not present

## 2020-12-08 DIAGNOSIS — E1165 Type 2 diabetes mellitus with hyperglycemia: Secondary | ICD-10-CM | POA: Diagnosis not present

## 2020-12-08 DIAGNOSIS — Z961 Presence of intraocular lens: Secondary | ICD-10-CM | POA: Diagnosis not present

## 2020-12-08 DIAGNOSIS — H02423 Myogenic ptosis of bilateral eyelids: Secondary | ICD-10-CM | POA: Diagnosis not present

## 2020-12-15 DIAGNOSIS — E1165 Type 2 diabetes mellitus with hyperglycemia: Secondary | ICD-10-CM | POA: Diagnosis not present

## 2020-12-15 DIAGNOSIS — Z720 Tobacco use: Secondary | ICD-10-CM | POA: Diagnosis not present

## 2020-12-15 DIAGNOSIS — J449 Chronic obstructive pulmonary disease, unspecified: Secondary | ICD-10-CM | POA: Diagnosis not present

## 2021-01-05 DIAGNOSIS — Z79899 Other long term (current) drug therapy: Secondary | ICD-10-CM | POA: Diagnosis not present

## 2021-01-05 DIAGNOSIS — R569 Unspecified convulsions: Secondary | ICD-10-CM | POA: Diagnosis not present

## 2021-01-05 DIAGNOSIS — Z2821 Immunization not carried out because of patient refusal: Secondary | ICD-10-CM | POA: Diagnosis not present

## 2021-01-05 DIAGNOSIS — Z6831 Body mass index (BMI) 31.0-31.9, adult: Secondary | ICD-10-CM | POA: Diagnosis not present

## 2021-01-05 DIAGNOSIS — R5383 Other fatigue: Secondary | ICD-10-CM | POA: Diagnosis not present

## 2021-01-05 DIAGNOSIS — I700 Atherosclerosis of aorta: Secondary | ICD-10-CM | POA: Diagnosis not present

## 2021-01-05 DIAGNOSIS — F321 Major depressive disorder, single episode, moderate: Secondary | ICD-10-CM | POA: Diagnosis not present

## 2021-01-05 DIAGNOSIS — J449 Chronic obstructive pulmonary disease, unspecified: Secondary | ICD-10-CM | POA: Diagnosis not present

## 2021-01-05 DIAGNOSIS — Z299 Encounter for prophylactic measures, unspecified: Secondary | ICD-10-CM | POA: Diagnosis not present

## 2021-02-03 DIAGNOSIS — J449 Chronic obstructive pulmonary disease, unspecified: Secondary | ICD-10-CM | POA: Diagnosis not present

## 2021-02-03 DIAGNOSIS — R413 Other amnesia: Secondary | ICD-10-CM | POA: Diagnosis not present

## 2021-02-03 DIAGNOSIS — R5383 Other fatigue: Secondary | ICD-10-CM | POA: Diagnosis not present

## 2021-02-03 DIAGNOSIS — E1165 Type 2 diabetes mellitus with hyperglycemia: Secondary | ICD-10-CM | POA: Diagnosis not present

## 2021-02-03 DIAGNOSIS — E6609 Other obesity due to excess calories: Secondary | ICD-10-CM | POA: Diagnosis not present

## 2021-02-03 DIAGNOSIS — Z7189 Other specified counseling: Secondary | ICD-10-CM | POA: Diagnosis not present

## 2021-02-03 DIAGNOSIS — Z0000 Encounter for general adult medical examination without abnormal findings: Secondary | ICD-10-CM | POA: Diagnosis not present

## 2021-02-03 DIAGNOSIS — E7800 Pure hypercholesterolemia, unspecified: Secondary | ICD-10-CM | POA: Diagnosis not present

## 2021-02-03 DIAGNOSIS — Z6831 Body mass index (BMI) 31.0-31.9, adult: Secondary | ICD-10-CM | POA: Diagnosis not present

## 2021-02-03 DIAGNOSIS — Z1339 Encounter for screening examination for other mental health and behavioral disorders: Secondary | ICD-10-CM | POA: Diagnosis not present

## 2021-02-03 DIAGNOSIS — Z125 Encounter for screening for malignant neoplasm of prostate: Secondary | ICD-10-CM | POA: Diagnosis not present

## 2021-02-03 DIAGNOSIS — Z79899 Other long term (current) drug therapy: Secondary | ICD-10-CM | POA: Diagnosis not present

## 2021-02-14 DIAGNOSIS — E1165 Type 2 diabetes mellitus with hyperglycemia: Secondary | ICD-10-CM | POA: Diagnosis not present

## 2021-02-14 DIAGNOSIS — J449 Chronic obstructive pulmonary disease, unspecified: Secondary | ICD-10-CM | POA: Diagnosis not present

## 2021-02-14 DIAGNOSIS — Z720 Tobacco use: Secondary | ICD-10-CM | POA: Diagnosis not present

## 2021-02-16 DIAGNOSIS — R413 Other amnesia: Secondary | ICD-10-CM | POA: Diagnosis not present

## 2021-03-03 DIAGNOSIS — R4181 Age-related cognitive decline: Secondary | ICD-10-CM | POA: Diagnosis not present

## 2021-03-03 DIAGNOSIS — Z87891 Personal history of nicotine dependence: Secondary | ICD-10-CM | POA: Diagnosis not present

## 2021-03-03 DIAGNOSIS — Z6830 Body mass index (BMI) 30.0-30.9, adult: Secondary | ICD-10-CM | POA: Diagnosis not present

## 2021-03-03 DIAGNOSIS — Z299 Encounter for prophylactic measures, unspecified: Secondary | ICD-10-CM | POA: Diagnosis not present

## 2021-03-03 DIAGNOSIS — I10 Essential (primary) hypertension: Secondary | ICD-10-CM | POA: Diagnosis not present

## 2021-03-16 DIAGNOSIS — I10 Essential (primary) hypertension: Secondary | ICD-10-CM | POA: Diagnosis not present

## 2021-03-16 DIAGNOSIS — E7800 Pure hypercholesterolemia, unspecified: Secondary | ICD-10-CM | POA: Diagnosis not present

## 2021-03-24 DIAGNOSIS — J209 Acute bronchitis, unspecified: Secondary | ICD-10-CM | POA: Diagnosis not present

## 2021-03-24 DIAGNOSIS — J440 Chronic obstructive pulmonary disease with acute lower respiratory infection: Secondary | ICD-10-CM | POA: Diagnosis not present

## 2021-03-24 DIAGNOSIS — Z299 Encounter for prophylactic measures, unspecified: Secondary | ICD-10-CM | POA: Diagnosis not present

## 2021-03-24 DIAGNOSIS — F419 Anxiety disorder, unspecified: Secondary | ICD-10-CM | POA: Diagnosis not present

## 2021-03-24 DIAGNOSIS — I10 Essential (primary) hypertension: Secondary | ICD-10-CM | POA: Diagnosis not present

## 2021-04-28 DIAGNOSIS — J449 Chronic obstructive pulmonary disease, unspecified: Secondary | ICD-10-CM | POA: Diagnosis not present

## 2021-04-28 DIAGNOSIS — J019 Acute sinusitis, unspecified: Secondary | ICD-10-CM | POA: Diagnosis not present

## 2021-04-28 DIAGNOSIS — F321 Major depressive disorder, single episode, moderate: Secondary | ICD-10-CM | POA: Diagnosis not present

## 2021-04-28 DIAGNOSIS — Z299 Encounter for prophylactic measures, unspecified: Secondary | ICD-10-CM | POA: Diagnosis not present

## 2021-04-28 DIAGNOSIS — E1165 Type 2 diabetes mellitus with hyperglycemia: Secondary | ICD-10-CM | POA: Diagnosis not present

## 2021-05-27 DIAGNOSIS — G4733 Obstructive sleep apnea (adult) (pediatric): Secondary | ICD-10-CM | POA: Diagnosis not present

## 2021-05-27 DIAGNOSIS — Z299 Encounter for prophylactic measures, unspecified: Secondary | ICD-10-CM | POA: Diagnosis not present

## 2021-05-27 DIAGNOSIS — I10 Essential (primary) hypertension: Secondary | ICD-10-CM | POA: Diagnosis not present

## 2021-05-27 DIAGNOSIS — E1165 Type 2 diabetes mellitus with hyperglycemia: Secondary | ICD-10-CM | POA: Diagnosis not present

## 2021-05-27 DIAGNOSIS — J449 Chronic obstructive pulmonary disease, unspecified: Secondary | ICD-10-CM | POA: Diagnosis not present

## 2021-06-17 DIAGNOSIS — I10 Essential (primary) hypertension: Secondary | ICD-10-CM | POA: Diagnosis not present

## 2021-06-17 DIAGNOSIS — E1165 Type 2 diabetes mellitus with hyperglycemia: Secondary | ICD-10-CM | POA: Diagnosis not present

## 2021-06-17 DIAGNOSIS — M545 Low back pain, unspecified: Secondary | ICD-10-CM | POA: Diagnosis not present

## 2021-06-17 DIAGNOSIS — J449 Chronic obstructive pulmonary disease, unspecified: Secondary | ICD-10-CM | POA: Diagnosis not present

## 2021-06-17 DIAGNOSIS — Z299 Encounter for prophylactic measures, unspecified: Secondary | ICD-10-CM | POA: Diagnosis not present

## 2021-07-08 DIAGNOSIS — Z299 Encounter for prophylactic measures, unspecified: Secondary | ICD-10-CM | POA: Diagnosis not present

## 2021-07-08 DIAGNOSIS — I10 Essential (primary) hypertension: Secondary | ICD-10-CM | POA: Diagnosis not present

## 2021-07-08 DIAGNOSIS — Z6831 Body mass index (BMI) 31.0-31.9, adult: Secondary | ICD-10-CM | POA: Diagnosis not present

## 2021-07-08 DIAGNOSIS — R350 Frequency of micturition: Secondary | ICD-10-CM | POA: Diagnosis not present

## 2021-07-08 DIAGNOSIS — M545 Low back pain, unspecified: Secondary | ICD-10-CM | POA: Diagnosis not present

## 2021-07-08 DIAGNOSIS — R32 Unspecified urinary incontinence: Secondary | ICD-10-CM | POA: Diagnosis not present

## 2021-07-28 DIAGNOSIS — J209 Acute bronchitis, unspecified: Secondary | ICD-10-CM | POA: Diagnosis not present

## 2021-07-28 DIAGNOSIS — Z299 Encounter for prophylactic measures, unspecified: Secondary | ICD-10-CM | POA: Diagnosis not present

## 2021-07-28 DIAGNOSIS — R059 Cough, unspecified: Secondary | ICD-10-CM | POA: Diagnosis not present

## 2021-07-28 DIAGNOSIS — J069 Acute upper respiratory infection, unspecified: Secondary | ICD-10-CM | POA: Diagnosis not present

## 2021-07-28 DIAGNOSIS — J440 Chronic obstructive pulmonary disease with acute lower respiratory infection: Secondary | ICD-10-CM | POA: Diagnosis not present

## 2021-08-05 DIAGNOSIS — C61 Malignant neoplasm of prostate: Secondary | ICD-10-CM | POA: Diagnosis not present

## 2021-08-09 DIAGNOSIS — Z789 Other specified health status: Secondary | ICD-10-CM | POA: Diagnosis not present

## 2021-08-09 DIAGNOSIS — R5383 Other fatigue: Secondary | ICD-10-CM | POA: Diagnosis not present

## 2021-08-09 DIAGNOSIS — R609 Edema, unspecified: Secondary | ICD-10-CM | POA: Diagnosis not present

## 2021-08-09 DIAGNOSIS — Z299 Encounter for prophylactic measures, unspecified: Secondary | ICD-10-CM | POA: Diagnosis not present

## 2021-08-09 DIAGNOSIS — I10 Essential (primary) hypertension: Secondary | ICD-10-CM | POA: Diagnosis not present

## 2021-08-16 DIAGNOSIS — J449 Chronic obstructive pulmonary disease, unspecified: Secondary | ICD-10-CM | POA: Diagnosis not present

## 2021-08-16 DIAGNOSIS — Z299 Encounter for prophylactic measures, unspecified: Secondary | ICD-10-CM | POA: Diagnosis not present

## 2021-08-16 DIAGNOSIS — I10 Essential (primary) hypertension: Secondary | ICD-10-CM | POA: Diagnosis not present

## 2021-08-16 DIAGNOSIS — F419 Anxiety disorder, unspecified: Secondary | ICD-10-CM | POA: Diagnosis not present

## 2021-08-16 DIAGNOSIS — Z789 Other specified health status: Secondary | ICD-10-CM | POA: Diagnosis not present

## 2021-08-21 IMAGING — DX PORTABLE CHEST - 1 VIEW SAME DAY
1 series · 1 of 1 positions shown · non-contrast
Comparison: December 10, 2018

CLINICAL DATA: Shortness of breath

EXAM:
PORTABLE CHEST 1 VIEW

[chest]
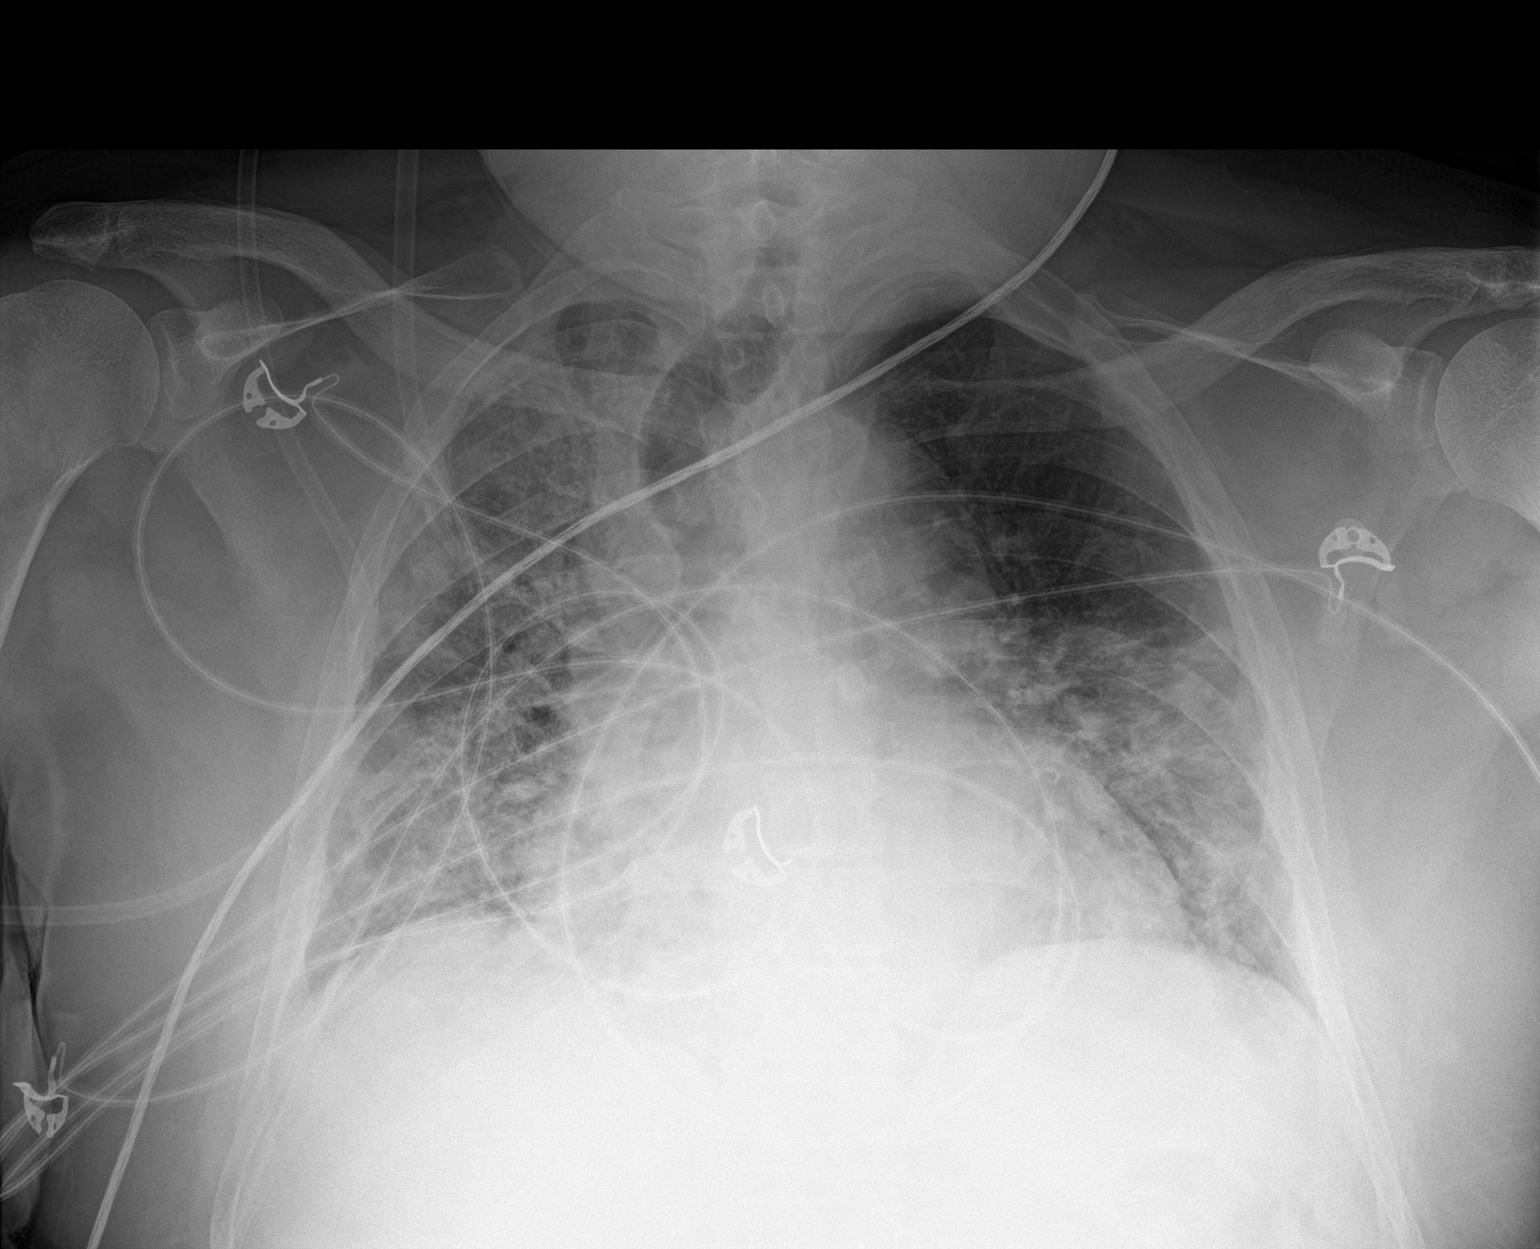

[1 of 1 positions shown; findings below may reference images not displayed]

FINDINGS: No pneumothorax. There is opacity diffusely throughout the right
lung which is worsened. There is opacity in left base which has
worsened. No other interval changes.
IMPRESSION: Worsening bilateral pulmonary opacities/infiltrates, diffusely
distributed on the right and localize in the left base on the left.
This may represent an infectious process. Asymmetric pulmonary edema
is considered less likely but possible.

## 2021-08-24 IMAGING — DX DG CHEST 1V PORT
1 series · 1 of 1 positions shown · non-contrast
Comparison: Chest x-ray 12/15/2018.

CLINICAL DATA: 72-year-old male with history of pneumonia from
[facility]-F4.

EXAM:
PORTABLE CHEST 1 VIEW

[chest]
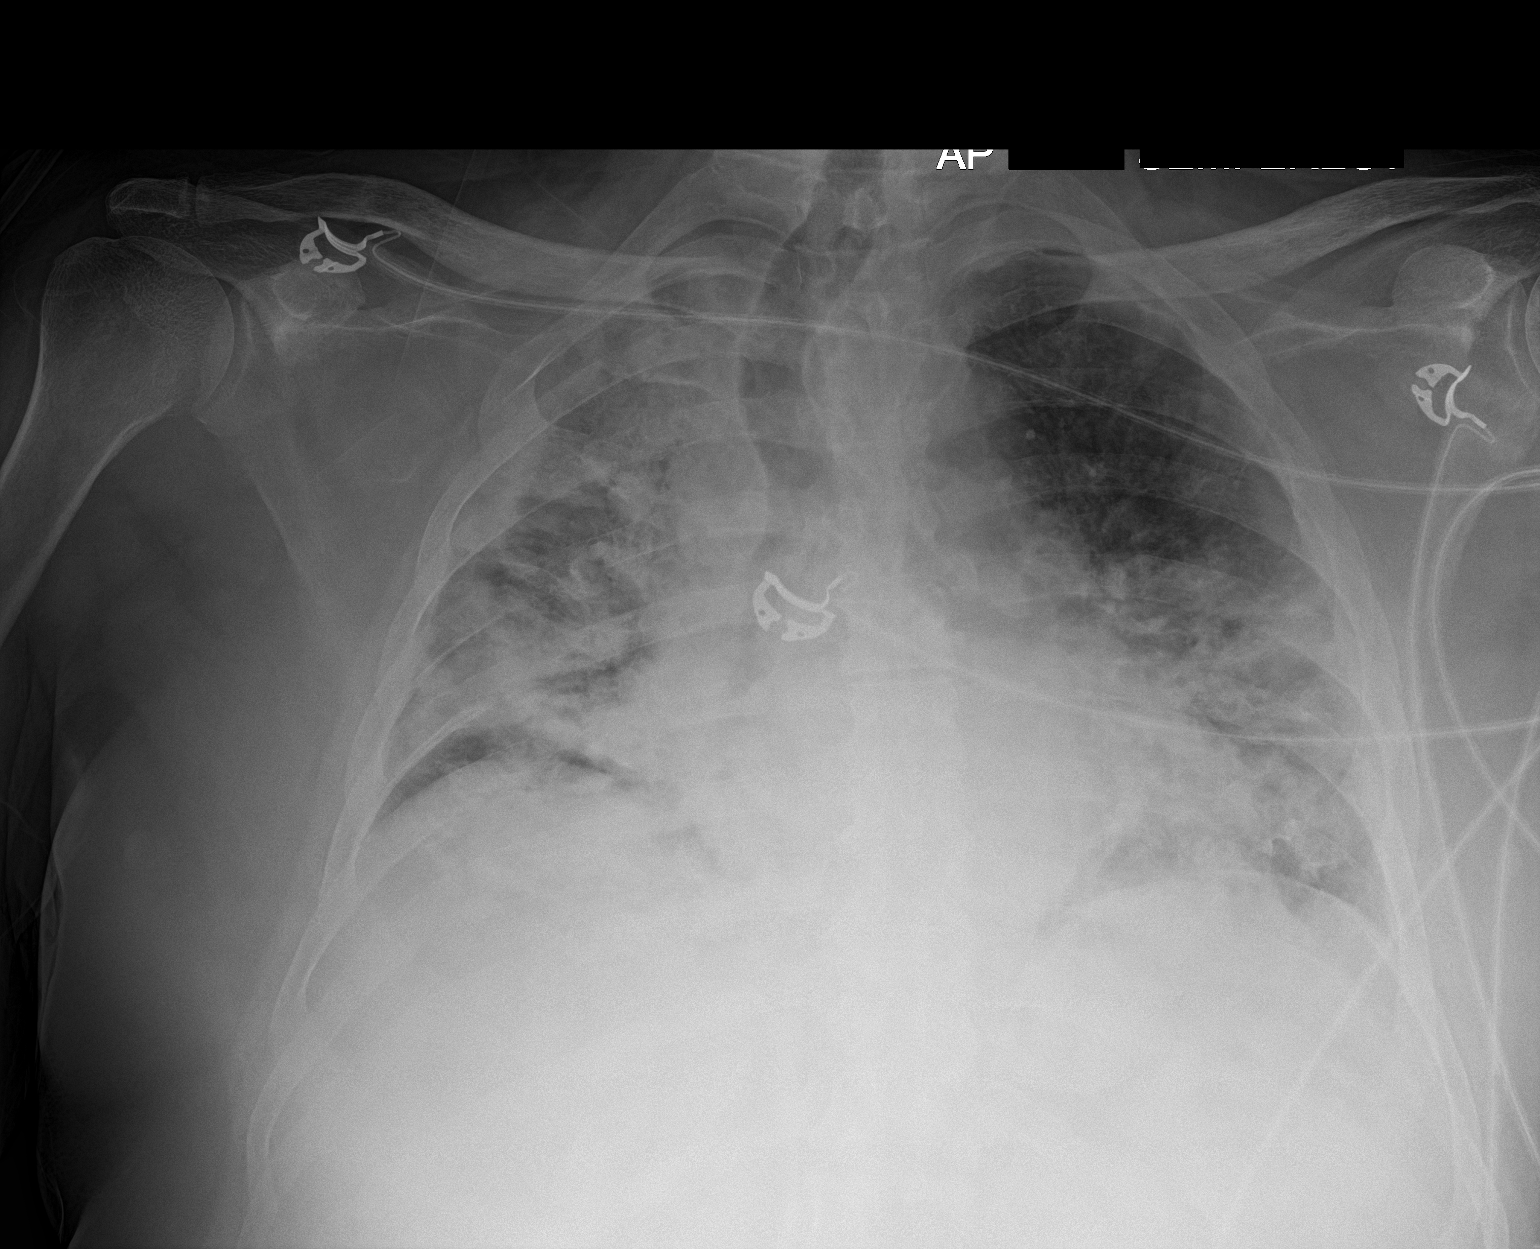

[1 of 1 positions shown; findings below may reference images not displayed]

FINDINGS: Lung volumes are low. Patchy multifocal airspace consolidation
throughout the lungs bilaterally, asymmetrically distributed (right
greater than left) with relative sparing of the left upper lobe.
Overall, aeration continues to worsened compared to the prior study.
No evidence of pulmonary edema. Heart size is mildly enlarged. Upper
mediastinal contours are unremarkable.
IMPRESSION: 1. Worsening multilobar bilateral pneumonia, as above.
2. Mild cardiomegaly.

## 2021-08-30 DIAGNOSIS — G4700 Insomnia, unspecified: Secondary | ICD-10-CM | POA: Diagnosis not present

## 2021-08-30 DIAGNOSIS — I10 Essential (primary) hypertension: Secondary | ICD-10-CM | POA: Diagnosis not present

## 2021-08-30 DIAGNOSIS — Z299 Encounter for prophylactic measures, unspecified: Secondary | ICD-10-CM | POA: Diagnosis not present

## 2021-08-30 DIAGNOSIS — Z6831 Body mass index (BMI) 31.0-31.9, adult: Secondary | ICD-10-CM | POA: Diagnosis not present

## 2021-08-30 DIAGNOSIS — E1165 Type 2 diabetes mellitus with hyperglycemia: Secondary | ICD-10-CM | POA: Diagnosis not present

## 2021-09-25 NOTE — Progress Notes (Unsigned)
Clinical Summary Derrick Bender is a 76 y.o.male seen today as a new patient, last seen in our clinic in 2017. Seen for the following medical problems.   1.LE edema - 2017 echo LVEF 65-70%, no WMAs, grade Idd, mild MR and AI - started 2-3 weeks ago. No recent SOB/DOE. No orthopnea - bilateral edema, nonpainful    2. OSA - he is compliant with meds   3. HTN - he is compliant with meds   4. Hyperlipidemia    5. History of TIA - has been on plavix   6. COPD - followed by primary Past Medical History:  Diagnosis Date   Asthma    COPD (chronic obstructive pulmonary disease) ([place])    CVA (cerebral vascular accident) ([place]) 01/2011   [place]   Depression    Diabetes ([place])    Dysarthria    Hyperlipidemia    Hypertension    OSA (obstructive sleep apnea)    Prostate cancer ([place])    Dr. Exie Parody   Seizure [facility])    TIA (transient ischemic attack)      Allergies  Allergen Reactions   Sulfamethoxazole Dermatitis     Current Outpatient Medications  Medication Sig Dispense Refill   albuterol (PROAIR HFA) 108 (90 Base) MCG/ACT inhaler Inhale 2 puffs into the lungs every 6 (six) hours as needed.     budesonide-formoterol (SYMBICORT) 160-4.5 MCG/ACT inhaler Inhale 2 puffs into the lungs daily.     clopidogrel (PLAVIX) 75 MG tablet Take 75 mg by mouth daily.     diazepam (VALIUM) 5 MG tablet Take 5 mg by mouth 2 (two) times daily as needed.     diltiazem (TIAZAC) 240 MG 24 hr capsule Take 1 capsule (240 mg total) by mouth daily. 30 capsule 0   escitalopram (LEXAPRO) 20 MG tablet Take 20 mg by mouth at bedtime.      Hydrocortisone, Perianal, (PROCTO-PAK) 1 % CREA Place 1 Bottle rectally 3 (three) times daily as needed (rectal discomfort). 28.4 g 0   hydrOXYzine (ATARAX/VISTARIL) 25 MG tablet Take 25 mg by mouth 3 (three) times daily.     isosorbide mononitrate (IMDUR) 30 MG 24 hr tablet Take 1 tablet (30 mg total) by mouth daily. 30 tablet 0   loperamide (IMODIUM A-D) 2 MG tablet  Take 1 tablet (2 mg total) by mouth 4 (four) times daily as needed for diarrhea or loose stools. 15 tablet 0   Melatonin 10 MG TABS Take 10 mg by mouth at bedtime.     omeprazole (PRILOSEC) 40 MG capsule Take 1 capsule by mouth daily.     oxybutynin (DITROPAN) 5 MG tablet Take 1 tablet by mouth 3 (three) times daily.     phenytoin (DILANTIN) 100 MG ER capsule Take 100 mg by mouth 3 (three) times daily.     predniSONE (DELTASONE) 10 MG tablet Take 1 tablet (10 mg total) by mouth daily. Once you finish the prescribed prednisone taper given at the hospital you can continue this medication as before if you were supposed to be on it chronically.  Check with your primary care physician.     predniSONE (DELTASONE) 5 MG tablet Label  & dispense according to the schedule below. take 8 Pills PO for 3 days, 6 Pills PO for 3 days, 4 Pills PO for 3 days, 2 Pills PO for 3 days, then continue your previous home dose of prednisone if your family physician asks you to do so, call your family physician 2 days  prior to this course is finishing to cross check. Total 65 pills. 65 tablet 0   simvastatin (ZOCOR) 20 MG tablet Take 20 mg by mouth at bedtime.     Testosterone 12.5 MG/ACT (1%) GEL Apply 1.25 g topically daily.  4   topiramate (TOPAMAX) 100 MG tablet Take 100 mg by mouth 2 (two) times daily.     No current facility-administered medications for this visit.     Past Surgical History:  Procedure Laterality Date   XI ROBOTIC ASSISTED SIMPLE PROSTATECTOMY  03/2013   Dr. Brendia Sacks     Allergies  Allergen Reactions   Sulfamethoxazole Dermatitis      Family History  Problem Relation Age of Onset   Bone cancer Mother    Pancreatic cancer Father      Social History Derrick Bender reports that he quit smoking about 26 years ago. His smoking use included cigarettes. He started smoking about 61 years ago. He has a 18.50 pack-year smoking history. He has never used smokeless tobacco. Derrick Bender has no history  on file for alcohol use.   Review of Systems CONSTITUTIONAL: No weight loss, fever, chills, weakness or fatigue.  HEENT: Eyes: No visual loss, blurred vision, double vision or yellow sclerae.No hearing loss, sneezing, congestion, runny nose or sore throat.  SKIN: No rash or itching.  CARDIOVASCULAR: per hpi RESPIRATORY: No shortness of breath, cough or sputum.  GASTROINTESTINAL: No anorexia, nausea, vomiting or diarrhea. No abdominal pain or blood.  GENITOURINARY: No burning on urination, no polyuria NEUROLOGICAL: No headache, dizziness, syncope, paralysis, ataxia, numbness or tingling in the extremities. No change in bowel or bladder control.  MUSCULOSKELETAL: No muscle, back pain, joint pain or stiffness.  LYMPHATICS: No enlarged nodes. No history of splenectomy.  PSYCHIATRIC: No history of depression or anxiety.  ENDOCRINOLOGIC: No reports of sweating, cold or heat intolerance. No polyuria or polydipsia.  Marland Kitchen   Physical Examination Vitals:   09/26/21 0902  BP: 124/68  Pulse: (!) 57  SpO2: 99%    Gen: resting comfortably, no acute distress HEENT: no scleral icterus, pupils equal round and reactive, no palptable cervical adenopathy,  CV: RRR, no m/r/g no jvd Resp: Clear to auscultation bilaterally GI: abdomen is soft, non-tender, non-distended, normal bowel sounds, no hepatosplenomegaly MSK: extremities are warm, 2+ edema.  Skin: warm, no rash Neuro:  no focal deficits Psych: appropriate affect   Diagnostic Studies  12/2015 nuclear stress  No diagnostic ST segment changes to indicate ischemia. Occasional PVCs. Small, mild intensity, inferior defect that is fixed and most prominent at rest, consistent with soft tissue attenuation. No large ischemic territories. This is a low risk study. Nuclear stress EF: 65%.   Assessment and Plan  LE edema - recent LE edema bilateral, some chronic SOB/DOE - will start lasix '20mg'$  daily, check bmet/mg/bnp in 2 weeks - obtain echo  2.  HTN - at goal, continue current meds      Arnoldo Lenis, M.D.

## 2021-09-26 ENCOUNTER — Encounter: Payer: Self-pay | Admitting: Cardiology

## 2021-09-26 ENCOUNTER — Ambulatory Visit: Payer: Medicare PPO | Admitting: Cardiology

## 2021-09-26 VITALS — BP 124/68 | HR 57 | Ht 69.0 in | Wt 207.0 lb

## 2021-09-26 DIAGNOSIS — I10 Essential (primary) hypertension: Secondary | ICD-10-CM | POA: Diagnosis not present

## 2021-09-26 DIAGNOSIS — Z79899 Other long term (current) drug therapy: Secondary | ICD-10-CM

## 2021-09-26 DIAGNOSIS — R600 Localized edema: Secondary | ICD-10-CM | POA: Diagnosis not present

## 2021-09-26 DIAGNOSIS — R0602 Shortness of breath: Secondary | ICD-10-CM | POA: Diagnosis not present

## 2021-09-26 MED ORDER — FUROSEMIDE 20 MG PO TABS: 20.0000 mg | ORAL_TABLET | Freq: Every day | ORAL | 6 refills | Status: DC

## 2021-09-26 NOTE — Patient Instructions (Addendum)
Medication Instructions:  Begin Lasix '20mg'$  daily  Continue all other medications.     Labwork: BMET, MG, BNP - orders given today Please do in 2 weeks (around 10/10/21)  Testing/Procedures: Your physician has requested that you have an echocardiogram. Echocardiography is a painless test that uses sound waves to create images of your heart. It provides your doctor with information about the size and shape of your heart and how well your heart's chambers and valves are working. This procedure takes approximately one hour. There are no restrictions for this procedure.  Follow-Up: Office will contact with results via phone or letter.    3 months   Any Other Special Instructions Will Be Listed Below (If Applicable).   If you need a refill on your cardiac medications before your next appointment, please call your pharmacy.

## 2021-09-27 ENCOUNTER — Ambulatory Visit (INDEPENDENT_AMBULATORY_CARE_PROVIDER_SITE_OTHER): Payer: Medicare PPO

## 2021-09-27 DIAGNOSIS — R0602 Shortness of breath: Secondary | ICD-10-CM | POA: Diagnosis not present

## 2021-09-27 LAB — ECHOCARDIOGRAM COMPLETE
AR max vel: 1.58 cm2
AV Area VTI: 1.72 cm2
AV Area mean vel: 1.59 cm2
AV Mean grad: 5.8 mmHg
AV Peak grad: 14.5 mmHg
AV Vena cont: 0.43 cm
Ao pk vel: 1.91 m/s
Area-P 1/2: 2.96 cm2
Calc EF: 61.6 %
MV M vel: 4.66 m/s
MV Peak grad: 86.7 mmHg
P 1/2 time: 772 msec
S' Lateral: 3.38 cm
Single Plane A2C EF: 60 %
Single Plane A4C EF: 61.6 %

## 2021-09-30 DIAGNOSIS — I10 Essential (primary) hypertension: Secondary | ICD-10-CM | POA: Diagnosis not present

## 2021-09-30 DIAGNOSIS — Z299 Encounter for prophylactic measures, unspecified: Secondary | ICD-10-CM | POA: Diagnosis not present

## 2021-09-30 DIAGNOSIS — J440 Chronic obstructive pulmonary disease with acute lower respiratory infection: Secondary | ICD-10-CM | POA: Diagnosis not present

## 2021-09-30 DIAGNOSIS — E1165 Type 2 diabetes mellitus with hyperglycemia: Secondary | ICD-10-CM | POA: Diagnosis not present

## 2021-09-30 DIAGNOSIS — J209 Acute bronchitis, unspecified: Secondary | ICD-10-CM | POA: Diagnosis not present

## 2021-10-25 ENCOUNTER — Encounter: Payer: Self-pay | Admitting: *Deleted

## 2021-10-25 ENCOUNTER — Telehealth: Payer: Self-pay | Admitting: *Deleted

## 2021-10-25 NOTE — Telephone Encounter (Signed)
Laurine Blazer, LPN  5/79/7282  0:60 PM EDT Back to Top    Patient notified via letter.  Copy to pcp.     Laurine Blazer, LPN  04/22/6151  7:94 PM EDT     No answer.    Arnoldo Lenis, MD  10/19/2021  3:53 PM EDT     Echo shows normal heart pumping function. Some mild age related stiffness that is common and a minor finding. Two heart valves have mild leaks which are not worrisome and just something to monitor   Zandra Abts MD

## 2021-11-16 DIAGNOSIS — M545 Low back pain, unspecified: Secondary | ICD-10-CM | POA: Diagnosis not present

## 2021-11-16 DIAGNOSIS — I10 Essential (primary) hypertension: Secondary | ICD-10-CM | POA: Diagnosis not present

## 2021-11-16 DIAGNOSIS — Z6832 Body mass index (BMI) 32.0-32.9, adult: Secondary | ICD-10-CM | POA: Diagnosis not present

## 2021-11-16 DIAGNOSIS — Z299 Encounter for prophylactic measures, unspecified: Secondary | ICD-10-CM | POA: Diagnosis not present

## 2021-11-16 DIAGNOSIS — Z713 Dietary counseling and surveillance: Secondary | ICD-10-CM | POA: Diagnosis not present

## 2021-12-08 DIAGNOSIS — I10 Essential (primary) hypertension: Secondary | ICD-10-CM | POA: Diagnosis not present

## 2021-12-08 DIAGNOSIS — H02402 Unspecified ptosis of left eyelid: Secondary | ICD-10-CM | POA: Diagnosis not present

## 2021-12-08 DIAGNOSIS — Z299 Encounter for prophylactic measures, unspecified: Secondary | ICD-10-CM | POA: Diagnosis not present

## 2021-12-08 DIAGNOSIS — I152 Hypertension secondary to endocrine disorders: Secondary | ICD-10-CM | POA: Diagnosis not present

## 2021-12-08 DIAGNOSIS — Z6832 Body mass index (BMI) 32.0-32.9, adult: Secondary | ICD-10-CM | POA: Diagnosis not present

## 2021-12-08 DIAGNOSIS — E1159 Type 2 diabetes mellitus with other circulatory complications: Secondary | ICD-10-CM | POA: Diagnosis not present

## 2021-12-12 DIAGNOSIS — M7989 Other specified soft tissue disorders: Secondary | ICD-10-CM | POA: Diagnosis not present

## 2021-12-12 DIAGNOSIS — E1165 Type 2 diabetes mellitus with hyperglycemia: Secondary | ICD-10-CM | POA: Diagnosis not present

## 2021-12-12 DIAGNOSIS — J449 Chronic obstructive pulmonary disease, unspecified: Secondary | ICD-10-CM | POA: Diagnosis not present

## 2021-12-12 DIAGNOSIS — R600 Localized edema: Secondary | ICD-10-CM | POA: Diagnosis not present

## 2021-12-12 DIAGNOSIS — I152 Hypertension secondary to endocrine disorders: Secondary | ICD-10-CM | POA: Diagnosis not present

## 2021-12-12 DIAGNOSIS — Z299 Encounter for prophylactic measures, unspecified: Secondary | ICD-10-CM | POA: Diagnosis not present

## 2021-12-12 DIAGNOSIS — E1159 Type 2 diabetes mellitus with other circulatory complications: Secondary | ICD-10-CM | POA: Diagnosis not present

## 2021-12-12 DIAGNOSIS — M25571 Pain in right ankle and joints of right foot: Secondary | ICD-10-CM | POA: Diagnosis not present

## 2021-12-27 ENCOUNTER — Encounter: Payer: Self-pay | Admitting: Cardiology

## 2021-12-27 ENCOUNTER — Ambulatory Visit: Payer: Medicare PPO | Attending: Cardiology | Admitting: Cardiology

## 2021-12-27 VITALS — BP 134/78 | HR 63 | Ht 69.0 in | Wt 220.0 lb

## 2021-12-27 DIAGNOSIS — I10 Essential (primary) hypertension: Secondary | ICD-10-CM | POA: Diagnosis not present

## 2021-12-27 DIAGNOSIS — Z79899 Other long term (current) drug therapy: Secondary | ICD-10-CM

## 2021-12-27 DIAGNOSIS — R600 Localized edema: Secondary | ICD-10-CM

## 2021-12-27 DIAGNOSIS — M62232 Nontraumatic ischemic infarction of muscle, left forearm: Secondary | ICD-10-CM | POA: Diagnosis not present

## 2021-12-27 NOTE — Patient Instructions (Signed)
Medication Instructions:  Continue all current medications.  Labwork: none  Testing/Procedures: BMET, BNP, Mg - orders given today. Office will contact with results via phone, letter or mychart.     Follow-Up: 6 months   Any Other Special Instructions Will Be Listed Below (If Applicable).   If you need a refill on your cardiac medications before your next appointment, please call your pharmacy.

## 2021-12-27 NOTE — Progress Notes (Signed)
Clinical Summary Derrick Bender is a 76 y.o.male seen today for a focused visit on recent issues with LE edema.   1.LE edema - 2017 echo LVEF 65-70%, no WMAs, grade Idd, mild MR and AI 09/2021 echo: LVEF 60-65%, no WMAs, grade I dd, normal RV, mild MR, mild to mod AI - started lasix '20mg'$  daily - don't see that he went for labs since last visit - swelling has improved since last visit     Other medical problems not addressed this visit   2. OSA - he is compliant with meds   3. HTN - he is compliant with meds   4. Hyperlipidemia     5. History of TIA - has been on plavix   6. COPD - followed by primary Past Medical History:  Diagnosis Date   Asthma    COPD (chronic obstructive pulmonary disease) ([place])    CVA (cerebral vascular accident) ([place]) 01/2011   [place]   Depression    Diabetes ([place])    Dysarthria    Hyperlipidemia    Hypertension    OSA (obstructive sleep apnea)    Prostate cancer ([place])    Dr. Exie Parody   Seizure [facility])    TIA (transient ischemic attack)      Allergies  Allergen Reactions   Sulfamethoxazole Dermatitis     Current Outpatient Medications  Medication Sig Dispense Refill   albuterol (PROAIR HFA) 108 (90 Base) MCG/ACT inhaler Inhale 2 puffs into the lungs every 6 (six) hours as needed.     ALPRAZolam (XANAX) 0.25 MG tablet Take 0.25 mg by mouth 2 (two) times daily as needed.     budesonide-formoterol (SYMBICORT) 160-4.5 MCG/ACT inhaler Inhale 2 puffs into the lungs daily.     clopidogrel (PLAVIX) 75 MG tablet Take 75 mg by mouth daily.     diazepam (VALIUM) 5 MG tablet Take 5 mg by mouth 2 (two) times daily as needed. (Patient not taking: Reported on 09/26/2021)     diltiazem (TIAZAC) 240 MG 24 hr capsule Take 1 capsule (240 mg total) by mouth daily. 30 capsule 0   donepezil (ARICEPT) 5 MG tablet Take 5 mg by mouth daily.     escitalopram (LEXAPRO) 20 MG tablet Take 20 mg by mouth at bedtime.  (Patient not taking: Reported on 09/26/2021)      furosemide (LASIX) 20 MG tablet Take 1 tablet (20 mg total) by mouth daily. 30 tablet 6   Hydrocortisone, Perianal, (PROCTO-PAK) 1 % CREA Place 1 Bottle rectally 3 (three) times daily as needed (rectal discomfort). (Patient not taking: Reported on 09/26/2021) 28.4 g 0   hydrOXYzine (ATARAX/VISTARIL) 25 MG tablet Take 25 mg by mouth 3 (three) times daily. (Patient not taking: Reported on 09/26/2021)     isosorbide mononitrate (IMDUR) 30 MG 24 hr tablet Take 1 tablet (30 mg total) by mouth daily. (Patient not taking: Reported on 09/26/2021) 30 tablet 0   lisinopril (ZESTRIL) 20 MG tablet Take by mouth.     loperamide (IMODIUM A-D) 2 MG tablet Take 1 tablet (2 mg total) by mouth 4 (four) times daily as needed for diarrhea or loose stools. 15 tablet 0   Melatonin 10 MG TABS Take 10 mg by mouth at bedtime.     mirtazapine (REMERON) 7.5 MG tablet Take 7.5 mg by mouth at bedtime.     omeprazole (PRILOSEC) 40 MG capsule Take 1 capsule by mouth daily. (Patient not taking: Reported on 09/26/2021)     oxybutynin (DITROPAN)  5 MG tablet Take 1 tablet by mouth 3 (three) times daily.     pantoprazole (PROTONIX) 40 MG tablet Take 40 mg by mouth daily.     phenytoin (DILANTIN) 100 MG ER capsule Take 100 mg by mouth 3 (three) times daily.     predniSONE (DELTASONE) 10 MG tablet Take 1 tablet (10 mg total) by mouth daily. Once you finish the prescribed prednisone taper given at the hospital you can continue this medication as before if you were supposed to be on it chronically.  Check with your primary care physician.     predniSONE (DELTASONE) 5 MG tablet Label  & dispense according to the schedule below. take 8 Pills PO for 3 days, 6 Pills PO for 3 days, 4 Pills PO for 3 days, 2 Pills PO for 3 days, then continue your previous home dose of prednisone if your family physician asks you to do so, call your family physician 2 days prior to this course is finishing to cross check. Total 65 pills. (Patient not taking: Reported on  09/26/2021) 65 tablet 0   simvastatin (ZOCOR) 10 MG tablet Take 10 mg by mouth daily.     simvastatin (ZOCOR) 20 MG tablet Take 20 mg by mouth at bedtime. (Patient not taking: Reported on 09/26/2021)     Testosterone 12.5 MG/ACT (1%) GEL Apply 1.25 g topically daily. (Patient not taking: Reported on 09/26/2021)  4   topiramate (TOPAMAX) 100 MG tablet Take 100 mg by mouth 2 (two) times daily.     No current facility-administered medications for this visit.     Past Surgical History:  Procedure Laterality Date   XI ROBOTIC ASSISTED SIMPLE PROSTATECTOMY  03/2013   Dr. Brendia Sacks     Allergies  Allergen Reactions   Sulfamethoxazole Dermatitis      Family History  Problem Relation Age of Onset   Bone cancer Mother    Pancreatic cancer Father      Social History Derrick Bender reports that he quit smoking about 26 years ago. His smoking use included cigarettes. He started smoking about 61 years ago. He has a 18.50 pack-year smoking history. He has never used smokeless tobacco. Derrick Bender has no history on file for alcohol use.   Review of Systems CONSTITUTIONAL: No weight loss, fever, chills, weakness or fatigue.  HEENT: Eyes: No visual loss, blurred vision, double vision or yellow sclerae.No hearing loss, sneezing, congestion, runny nose or sore throat.  SKIN: No rash or itching.  CARDIOVASCULAR: per hpi RESPIRATORY: No shortness of breath, cough or sputum.  GASTROINTESTINAL: No anorexia, nausea, vomiting or diarrhea. No abdominal pain or blood.  GENITOURINARY: No burning on urination, no polyuria NEUROLOGICAL: No headache, dizziness, syncope, paralysis, ataxia, numbness or tingling in the extremities. No change in bowel or bladder control.  MUSCULOSKELETAL: No muscle, back pain, joint pain or stiffness.  LYMPHATICS: No enlarged nodes. No history of splenectomy.  PSYCHIATRIC: No history of depression or anxiety.  ENDOCRINOLOGIC: No reports of sweating, cold or heat intolerance. No  polyuria or polydipsia.  Marland Kitchen   Physical Examination Today's Vitals   12/27/21 1121  BP: 134/78  Pulse: 63  SpO2: 96%  Weight: 220 lb (99.8 kg)  Height: '5\' 9"'$  (1.753 m)   Body mass index is 32.49 kg/m.  Gen: resting comfortably, no acute distress HEENT: no scleral icterus, pupils equal round and reactive, no palptable cervical adenopathy,  CV: RRR, no m/r/g, no jvd Resp: Clear to auscultation bilaterally GI: abdomen is soft, non-tender, non-distended, normal  bowel sounds, no hepatosplenomegaly MSK: extremities are warm, no edema.  Skin: warm, no rash Neuro:  no focal deficits Psych: appropriate affect   Diagnostic Studies  12/2015 nuclear stress   No diagnostic ST segment changes to indicate ischemia. Occasional PVCs. Small, mild intensity, inferior defect that is fixed and most prominent at rest, consistent with soft tissue attenuation. No large ischemic territories. This is a low risk study. Nuclear stress EF: 65%.  09/2021 echo  IMPRESSIONS     1. Left ventricular ejection fraction, by estimation, is 60 to 65%. The  left ventricle has normal function. The left ventricle has no regional  wall motion abnormalities. Left ventricular diastolic parameters are  consistent with Grade I diastolic  dysfunction (impaired relaxation).   2. Right ventricular systolic function is normal. The right ventricular  size is normal. Tricuspid regurgitation signal is inadequate for assessing  PA pressure.   3. The mitral valve is abnormal. Mild mitral valve regurgitation. No  evidence of mitral stenosis.   4. The tricuspid valve is abnormal.   5. The aortic valve is tricuspid. There is mild calcification of the  aortic valve. There is mild thickening of the aortic valve. Aortic valve  regurgitation is mild to moderate. No aortic stenosis is present.    Assessment and Plan  LE edema - recent LE edema bilateral, some chronic SOB/DOE - echo overall benign findings - improving with  lasix '20mg'$  daily, reorder his bmet/mg/bnp - continue current meds           Arnoldo Lenis, M.D

## 2022-01-19 DIAGNOSIS — J440 Chronic obstructive pulmonary disease with acute lower respiratory infection: Secondary | ICD-10-CM | POA: Diagnosis not present

## 2022-01-19 DIAGNOSIS — I700 Atherosclerosis of aorta: Secondary | ICD-10-CM | POA: Diagnosis not present

## 2022-01-19 DIAGNOSIS — J209 Acute bronchitis, unspecified: Secondary | ICD-10-CM | POA: Diagnosis not present

## 2022-01-19 DIAGNOSIS — Z299 Encounter for prophylactic measures, unspecified: Secondary | ICD-10-CM | POA: Diagnosis not present

## 2022-01-19 DIAGNOSIS — E1165 Type 2 diabetes mellitus with hyperglycemia: Secondary | ICD-10-CM | POA: Diagnosis not present

## 2022-01-19 DIAGNOSIS — J069 Acute upper respiratory infection, unspecified: Secondary | ICD-10-CM | POA: Diagnosis not present

## 2022-01-19 DIAGNOSIS — F419 Anxiety disorder, unspecified: Secondary | ICD-10-CM | POA: Diagnosis not present

## 2022-02-01 DIAGNOSIS — J310 Chronic rhinitis: Secondary | ICD-10-CM | POA: Diagnosis not present

## 2022-02-01 DIAGNOSIS — Z299 Encounter for prophylactic measures, unspecified: Secondary | ICD-10-CM | POA: Diagnosis not present

## 2022-02-01 DIAGNOSIS — R0981 Nasal congestion: Secondary | ICD-10-CM | POA: Diagnosis not present

## 2022-02-09 DIAGNOSIS — Z87891 Personal history of nicotine dependence: Secondary | ICD-10-CM | POA: Diagnosis not present

## 2022-02-09 DIAGNOSIS — E7800 Pure hypercholesterolemia, unspecified: Secondary | ICD-10-CM | POA: Diagnosis not present

## 2022-02-09 DIAGNOSIS — R5383 Other fatigue: Secondary | ICD-10-CM | POA: Diagnosis not present

## 2022-02-09 DIAGNOSIS — Z125 Encounter for screening for malignant neoplasm of prostate: Secondary | ICD-10-CM | POA: Diagnosis not present

## 2022-02-09 DIAGNOSIS — Z79899 Other long term (current) drug therapy: Secondary | ICD-10-CM | POA: Diagnosis not present

## 2022-02-09 DIAGNOSIS — Z7189 Other specified counseling: Secondary | ICD-10-CM | POA: Diagnosis not present

## 2022-02-09 DIAGNOSIS — Z1331 Encounter for screening for depression: Secondary | ICD-10-CM | POA: Diagnosis not present

## 2022-02-09 DIAGNOSIS — Z299 Encounter for prophylactic measures, unspecified: Secondary | ICD-10-CM | POA: Diagnosis not present

## 2022-02-09 DIAGNOSIS — Z1339 Encounter for screening examination for other mental health and behavioral disorders: Secondary | ICD-10-CM | POA: Diagnosis not present

## 2022-02-09 DIAGNOSIS — Z0000 Encounter for general adult medical examination without abnormal findings: Secondary | ICD-10-CM | POA: Diagnosis not present

## 2022-02-18 DIAGNOSIS — M545 Low back pain, unspecified: Secondary | ICD-10-CM | POA: Diagnosis not present

## 2022-03-16 DIAGNOSIS — J029 Acute pharyngitis, unspecified: Secondary | ICD-10-CM | POA: Diagnosis not present

## 2022-03-16 DIAGNOSIS — R5383 Other fatigue: Secondary | ICD-10-CM | POA: Diagnosis not present

## 2022-03-16 DIAGNOSIS — Z299 Encounter for prophylactic measures, unspecified: Secondary | ICD-10-CM | POA: Diagnosis not present

## 2022-03-16 DIAGNOSIS — J069 Acute upper respiratory infection, unspecified: Secondary | ICD-10-CM | POA: Diagnosis not present

## 2022-03-21 ENCOUNTER — Other Ambulatory Visit: Payer: Self-pay | Admitting: Cardiology

## 2022-03-24 DIAGNOSIS — M545 Low back pain, unspecified: Secondary | ICD-10-CM | POA: Diagnosis not present

## 2022-03-29 DIAGNOSIS — Z9849 Cataract extraction status, unspecified eye: Secondary | ICD-10-CM | POA: Diagnosis not present

## 2022-03-29 DIAGNOSIS — H35373 Puckering of macula, bilateral: Secondary | ICD-10-CM | POA: Diagnosis not present

## 2022-03-29 DIAGNOSIS — Z961 Presence of intraocular lens: Secondary | ICD-10-CM | POA: Diagnosis not present

## 2022-03-29 DIAGNOSIS — H02413 Mechanical ptosis of bilateral eyelids: Secondary | ICD-10-CM | POA: Diagnosis not present

## 2022-04-11 DIAGNOSIS — M545 Low back pain, unspecified: Secondary | ICD-10-CM | POA: Diagnosis not present

## 2022-04-13 DIAGNOSIS — I10 Essential (primary) hypertension: Secondary | ICD-10-CM | POA: Diagnosis not present

## 2022-04-13 DIAGNOSIS — Z789 Other specified health status: Secondary | ICD-10-CM | POA: Diagnosis not present

## 2022-04-13 DIAGNOSIS — E1159 Type 2 diabetes mellitus with other circulatory complications: Secondary | ICD-10-CM | POA: Diagnosis not present

## 2022-04-13 DIAGNOSIS — D509 Iron deficiency anemia, unspecified: Secondary | ICD-10-CM | POA: Diagnosis not present

## 2022-04-13 DIAGNOSIS — I152 Hypertension secondary to endocrine disorders: Secondary | ICD-10-CM | POA: Diagnosis not present

## 2022-04-13 DIAGNOSIS — Z299 Encounter for prophylactic measures, unspecified: Secondary | ICD-10-CM | POA: Diagnosis not present

## 2022-04-18 DIAGNOSIS — D649 Anemia, unspecified: Secondary | ICD-10-CM | POA: Diagnosis not present

## 2022-04-28 DIAGNOSIS — M545 Low back pain, unspecified: Secondary | ICD-10-CM | POA: Diagnosis not present

## 2022-05-08 DIAGNOSIS — I700 Atherosclerosis of aorta: Secondary | ICD-10-CM | POA: Diagnosis not present

## 2022-05-08 DIAGNOSIS — J440 Chronic obstructive pulmonary disease with acute lower respiratory infection: Secondary | ICD-10-CM | POA: Diagnosis not present

## 2022-05-08 DIAGNOSIS — J209 Acute bronchitis, unspecified: Secondary | ICD-10-CM | POA: Diagnosis not present

## 2022-05-08 DIAGNOSIS — J069 Acute upper respiratory infection, unspecified: Secondary | ICD-10-CM | POA: Diagnosis not present

## 2022-05-08 DIAGNOSIS — J029 Acute pharyngitis, unspecified: Secondary | ICD-10-CM | POA: Diagnosis not present

## 2022-05-08 DIAGNOSIS — Z299 Encounter for prophylactic measures, unspecified: Secondary | ICD-10-CM | POA: Diagnosis not present

## 2022-05-24 DIAGNOSIS — M545 Low back pain, unspecified: Secondary | ICD-10-CM | POA: Diagnosis not present

## 2022-05-24 DIAGNOSIS — G8929 Other chronic pain: Secondary | ICD-10-CM | POA: Diagnosis not present

## 2022-05-24 DIAGNOSIS — M7918 Myalgia, other site: Secondary | ICD-10-CM | POA: Diagnosis not present

## 2022-05-24 DIAGNOSIS — M549 Dorsalgia, unspecified: Secondary | ICD-10-CM | POA: Diagnosis not present

## 2022-05-28 DIAGNOSIS — K8689 Other specified diseases of pancreas: Secondary | ICD-10-CM | POA: Diagnosis not present

## 2022-05-28 DIAGNOSIS — I5032 Chronic diastolic (congestive) heart failure: Secondary | ICD-10-CM | POA: Diagnosis not present

## 2022-05-28 DIAGNOSIS — E119 Type 2 diabetes mellitus without complications: Secondary | ICD-10-CM | POA: Diagnosis not present

## 2022-05-28 DIAGNOSIS — I110 Hypertensive heart disease with heart failure: Secondary | ICD-10-CM | POA: Diagnosis not present

## 2022-05-28 DIAGNOSIS — J440 Chronic obstructive pulmonary disease with acute lower respiratory infection: Secondary | ICD-10-CM | POA: Diagnosis not present

## 2022-05-28 DIAGNOSIS — Z79899 Other long term (current) drug therapy: Secondary | ICD-10-CM | POA: Diagnosis not present

## 2022-05-28 DIAGNOSIS — N390 Urinary tract infection, site not specified: Secondary | ICD-10-CM | POA: Diagnosis not present

## 2022-05-28 DIAGNOSIS — K5780 Diverticulitis of intestine, part unspecified, with perforation and abscess without bleeding: Secondary | ICD-10-CM | POA: Diagnosis not present

## 2022-05-28 DIAGNOSIS — I10 Essential (primary) hypertension: Secondary | ICD-10-CM | POA: Diagnosis not present

## 2022-05-28 DIAGNOSIS — J439 Emphysema, unspecified: Secondary | ICD-10-CM | POA: Diagnosis not present

## 2022-05-28 DIAGNOSIS — N179 Acute kidney failure, unspecified: Secondary | ICD-10-CM | POA: Diagnosis not present

## 2022-05-28 DIAGNOSIS — K5730 Diverticulosis of large intestine without perforation or abscess without bleeding: Secondary | ICD-10-CM | POA: Diagnosis not present

## 2022-05-28 DIAGNOSIS — E785 Hyperlipidemia, unspecified: Secondary | ICD-10-CM | POA: Diagnosis not present

## 2022-05-28 DIAGNOSIS — K5720 Diverticulitis of large intestine with perforation and abscess without bleeding: Secondary | ICD-10-CM | POA: Diagnosis not present

## 2022-05-28 DIAGNOSIS — J441 Chronic obstructive pulmonary disease with (acute) exacerbation: Secondary | ICD-10-CM | POA: Diagnosis not present

## 2022-05-28 DIAGNOSIS — G4733 Obstructive sleep apnea (adult) (pediatric): Secondary | ICD-10-CM | POA: Diagnosis not present

## 2022-05-28 DIAGNOSIS — Z792 Long term (current) use of antibiotics: Secondary | ICD-10-CM | POA: Diagnosis not present

## 2022-05-28 DIAGNOSIS — Z20822 Contact with and (suspected) exposure to covid-19: Secondary | ICD-10-CM | POA: Diagnosis not present

## 2022-05-28 DIAGNOSIS — A419 Sepsis, unspecified organism: Secondary | ICD-10-CM | POA: Diagnosis not present

## 2022-05-28 DIAGNOSIS — B966 Bacteroides fragilis [B. fragilis] as the cause of diseases classified elsewhere: Secondary | ICD-10-CM | POA: Diagnosis not present

## 2022-05-28 DIAGNOSIS — K5792 Diverticulitis of intestine, part unspecified, without perforation or abscess without bleeding: Secondary | ICD-10-CM | POA: Diagnosis not present

## 2022-05-28 DIAGNOSIS — N281 Cyst of kidney, acquired: Secondary | ICD-10-CM | POA: Diagnosis not present

## 2022-05-28 DIAGNOSIS — R918 Other nonspecific abnormal finding of lung field: Secondary | ICD-10-CM | POA: Diagnosis not present

## 2022-05-28 DIAGNOSIS — R509 Fever, unspecified: Secondary | ICD-10-CM | POA: Diagnosis not present

## 2022-05-28 DIAGNOSIS — R7881 Bacteremia: Secondary | ICD-10-CM | POA: Diagnosis not present

## 2022-05-28 DIAGNOSIS — K838 Other specified diseases of biliary tract: Secondary | ICD-10-CM | POA: Diagnosis not present

## 2022-05-28 DIAGNOSIS — R110 Nausea: Secondary | ICD-10-CM | POA: Diagnosis not present

## 2022-05-28 DIAGNOSIS — R059 Cough, unspecified: Secondary | ICD-10-CM | POA: Diagnosis not present

## 2022-05-28 DIAGNOSIS — R6510 Systemic inflammatory response syndrome (SIRS) of non-infectious origin without acute organ dysfunction: Secondary | ICD-10-CM | POA: Diagnosis not present

## 2022-05-28 DIAGNOSIS — Z9989 Dependence on other enabling machines and devices: Secondary | ICD-10-CM | POA: Diagnosis not present

## 2022-05-28 DIAGNOSIS — K37 Unspecified appendicitis: Secondary | ICD-10-CM | POA: Diagnosis not present

## 2022-05-29 DIAGNOSIS — Z79899 Other long term (current) drug therapy: Secondary | ICD-10-CM | POA: Diagnosis not present

## 2022-05-29 DIAGNOSIS — N179 Acute kidney failure, unspecified: Secondary | ICD-10-CM | POA: Diagnosis not present

## 2022-05-29 DIAGNOSIS — E119 Type 2 diabetes mellitus without complications: Secondary | ICD-10-CM | POA: Diagnosis not present

## 2022-05-29 DIAGNOSIS — E785 Hyperlipidemia, unspecified: Secondary | ICD-10-CM | POA: Diagnosis not present

## 2022-05-29 DIAGNOSIS — I10 Essential (primary) hypertension: Secondary | ICD-10-CM | POA: Diagnosis not present

## 2022-05-29 DIAGNOSIS — Z792 Long term (current) use of antibiotics: Secondary | ICD-10-CM | POA: Diagnosis not present

## 2022-05-29 DIAGNOSIS — R6510 Systemic inflammatory response syndrome (SIRS) of non-infectious origin without acute organ dysfunction: Secondary | ICD-10-CM | POA: Diagnosis not present

## 2022-06-06 DIAGNOSIS — I5032 Chronic diastolic (congestive) heart failure: Secondary | ICD-10-CM | POA: Diagnosis not present

## 2022-06-06 DIAGNOSIS — J441 Chronic obstructive pulmonary disease with (acute) exacerbation: Secondary | ICD-10-CM | POA: Diagnosis not present

## 2022-06-06 DIAGNOSIS — G40909 Epilepsy, unspecified, not intractable, without status epilepticus: Secondary | ICD-10-CM | POA: Diagnosis not present

## 2022-06-06 DIAGNOSIS — N179 Acute kidney failure, unspecified: Secondary | ICD-10-CM | POA: Diagnosis not present

## 2022-06-06 DIAGNOSIS — I110 Hypertensive heart disease with heart failure: Secondary | ICD-10-CM | POA: Diagnosis not present

## 2022-06-06 DIAGNOSIS — E119 Type 2 diabetes mellitus without complications: Secondary | ICD-10-CM | POA: Diagnosis not present

## 2022-06-08 DIAGNOSIS — K5720 Diverticulitis of large intestine with perforation and abscess without bleeding: Secondary | ICD-10-CM | POA: Diagnosis not present

## 2022-06-08 DIAGNOSIS — E46 Unspecified protein-calorie malnutrition: Secondary | ICD-10-CM | POA: Diagnosis not present

## 2022-06-08 DIAGNOSIS — Z299 Encounter for prophylactic measures, unspecified: Secondary | ICD-10-CM | POA: Diagnosis not present

## 2022-06-08 DIAGNOSIS — J449 Chronic obstructive pulmonary disease, unspecified: Secondary | ICD-10-CM | POA: Diagnosis not present

## 2022-06-08 DIAGNOSIS — Z09 Encounter for follow-up examination after completed treatment for conditions other than malignant neoplasm: Secondary | ICD-10-CM | POA: Diagnosis not present

## 2022-06-09 DIAGNOSIS — J441 Chronic obstructive pulmonary disease with (acute) exacerbation: Secondary | ICD-10-CM | POA: Diagnosis not present

## 2022-06-09 DIAGNOSIS — N179 Acute kidney failure, unspecified: Secondary | ICD-10-CM | POA: Diagnosis not present

## 2022-06-09 DIAGNOSIS — E119 Type 2 diabetes mellitus without complications: Secondary | ICD-10-CM | POA: Diagnosis not present

## 2022-06-09 DIAGNOSIS — I5032 Chronic diastolic (congestive) heart failure: Secondary | ICD-10-CM | POA: Diagnosis not present

## 2022-06-09 DIAGNOSIS — G40909 Epilepsy, unspecified, not intractable, without status epilepticus: Secondary | ICD-10-CM | POA: Diagnosis not present

## 2022-06-09 DIAGNOSIS — I110 Hypertensive heart disease with heart failure: Secondary | ICD-10-CM | POA: Diagnosis not present

## 2022-06-12 DIAGNOSIS — I110 Hypertensive heart disease with heart failure: Secondary | ICD-10-CM | POA: Diagnosis not present

## 2022-06-12 DIAGNOSIS — I5032 Chronic diastolic (congestive) heart failure: Secondary | ICD-10-CM | POA: Diagnosis not present

## 2022-06-12 DIAGNOSIS — G40909 Epilepsy, unspecified, not intractable, without status epilepticus: Secondary | ICD-10-CM | POA: Diagnosis not present

## 2022-06-12 DIAGNOSIS — J441 Chronic obstructive pulmonary disease with (acute) exacerbation: Secondary | ICD-10-CM | POA: Diagnosis not present

## 2022-06-12 DIAGNOSIS — E119 Type 2 diabetes mellitus without complications: Secondary | ICD-10-CM | POA: Diagnosis not present

## 2022-06-12 DIAGNOSIS — N179 Acute kidney failure, unspecified: Secondary | ICD-10-CM | POA: Diagnosis not present

## 2022-06-14 DIAGNOSIS — G40909 Epilepsy, unspecified, not intractable, without status epilepticus: Secondary | ICD-10-CM | POA: Diagnosis not present

## 2022-06-14 DIAGNOSIS — J441 Chronic obstructive pulmonary disease with (acute) exacerbation: Secondary | ICD-10-CM | POA: Diagnosis not present

## 2022-06-14 DIAGNOSIS — E119 Type 2 diabetes mellitus without complications: Secondary | ICD-10-CM | POA: Diagnosis not present

## 2022-06-14 DIAGNOSIS — N179 Acute kidney failure, unspecified: Secondary | ICD-10-CM | POA: Diagnosis not present

## 2022-06-14 DIAGNOSIS — I110 Hypertensive heart disease with heart failure: Secondary | ICD-10-CM | POA: Diagnosis not present

## 2022-06-14 DIAGNOSIS — I5032 Chronic diastolic (congestive) heart failure: Secondary | ICD-10-CM | POA: Diagnosis not present

## 2022-06-16 DIAGNOSIS — G40909 Epilepsy, unspecified, not intractable, without status epilepticus: Secondary | ICD-10-CM | POA: Diagnosis not present

## 2022-06-16 DIAGNOSIS — I5032 Chronic diastolic (congestive) heart failure: Secondary | ICD-10-CM | POA: Diagnosis not present

## 2022-06-16 DIAGNOSIS — I110 Hypertensive heart disease with heart failure: Secondary | ICD-10-CM | POA: Diagnosis not present

## 2022-06-16 DIAGNOSIS — J441 Chronic obstructive pulmonary disease with (acute) exacerbation: Secondary | ICD-10-CM | POA: Diagnosis not present

## 2022-06-16 DIAGNOSIS — E119 Type 2 diabetes mellitus without complications: Secondary | ICD-10-CM | POA: Diagnosis not present

## 2022-06-16 DIAGNOSIS — N179 Acute kidney failure, unspecified: Secondary | ICD-10-CM | POA: Diagnosis not present

## 2022-06-20 DIAGNOSIS — J441 Chronic obstructive pulmonary disease with (acute) exacerbation: Secondary | ICD-10-CM | POA: Diagnosis not present

## 2022-06-20 DIAGNOSIS — I5032 Chronic diastolic (congestive) heart failure: Secondary | ICD-10-CM | POA: Diagnosis not present

## 2022-06-20 DIAGNOSIS — E119 Type 2 diabetes mellitus without complications: Secondary | ICD-10-CM | POA: Diagnosis not present

## 2022-06-20 DIAGNOSIS — G40909 Epilepsy, unspecified, not intractable, without status epilepticus: Secondary | ICD-10-CM | POA: Diagnosis not present

## 2022-06-20 DIAGNOSIS — I110 Hypertensive heart disease with heart failure: Secondary | ICD-10-CM | POA: Diagnosis not present

## 2022-06-20 DIAGNOSIS — N179 Acute kidney failure, unspecified: Secondary | ICD-10-CM | POA: Diagnosis not present

## 2022-06-21 DIAGNOSIS — I110 Hypertensive heart disease with heart failure: Secondary | ICD-10-CM | POA: Diagnosis not present

## 2022-06-21 DIAGNOSIS — J441 Chronic obstructive pulmonary disease with (acute) exacerbation: Secondary | ICD-10-CM | POA: Diagnosis not present

## 2022-06-21 DIAGNOSIS — E119 Type 2 diabetes mellitus without complications: Secondary | ICD-10-CM | POA: Diagnosis not present

## 2022-06-21 DIAGNOSIS — I5032 Chronic diastolic (congestive) heart failure: Secondary | ICD-10-CM | POA: Diagnosis not present

## 2022-06-21 DIAGNOSIS — G40909 Epilepsy, unspecified, not intractable, without status epilepticus: Secondary | ICD-10-CM | POA: Diagnosis not present

## 2022-06-21 DIAGNOSIS — N179 Acute kidney failure, unspecified: Secondary | ICD-10-CM | POA: Diagnosis not present

## 2022-06-22 DIAGNOSIS — I5032 Chronic diastolic (congestive) heart failure: Secondary | ICD-10-CM | POA: Diagnosis not present

## 2022-06-22 DIAGNOSIS — G40909 Epilepsy, unspecified, not intractable, without status epilepticus: Secondary | ICD-10-CM | POA: Diagnosis not present

## 2022-06-22 DIAGNOSIS — J441 Chronic obstructive pulmonary disease with (acute) exacerbation: Secondary | ICD-10-CM | POA: Diagnosis not present

## 2022-06-22 DIAGNOSIS — N179 Acute kidney failure, unspecified: Secondary | ICD-10-CM | POA: Diagnosis not present

## 2022-06-22 DIAGNOSIS — E119 Type 2 diabetes mellitus without complications: Secondary | ICD-10-CM | POA: Diagnosis not present

## 2022-06-22 DIAGNOSIS — I110 Hypertensive heart disease with heart failure: Secondary | ICD-10-CM | POA: Diagnosis not present

## 2022-06-27 DIAGNOSIS — I110 Hypertensive heart disease with heart failure: Secondary | ICD-10-CM | POA: Diagnosis not present

## 2022-06-27 DIAGNOSIS — N179 Acute kidney failure, unspecified: Secondary | ICD-10-CM | POA: Diagnosis not present

## 2022-06-27 DIAGNOSIS — G40909 Epilepsy, unspecified, not intractable, without status epilepticus: Secondary | ICD-10-CM | POA: Diagnosis not present

## 2022-06-27 DIAGNOSIS — I5032 Chronic diastolic (congestive) heart failure: Secondary | ICD-10-CM | POA: Diagnosis not present

## 2022-06-27 DIAGNOSIS — J441 Chronic obstructive pulmonary disease with (acute) exacerbation: Secondary | ICD-10-CM | POA: Diagnosis not present

## 2022-06-27 DIAGNOSIS — E119 Type 2 diabetes mellitus without complications: Secondary | ICD-10-CM | POA: Diagnosis not present

## 2022-06-30 DIAGNOSIS — N179 Acute kidney failure, unspecified: Secondary | ICD-10-CM | POA: Diagnosis not present

## 2022-06-30 DIAGNOSIS — J069 Acute upper respiratory infection, unspecified: Secondary | ICD-10-CM | POA: Diagnosis not present

## 2022-06-30 DIAGNOSIS — J441 Chronic obstructive pulmonary disease with (acute) exacerbation: Secondary | ICD-10-CM | POA: Diagnosis not present

## 2022-06-30 DIAGNOSIS — D692 Other nonthrombocytopenic purpura: Secondary | ICD-10-CM | POA: Diagnosis not present

## 2022-06-30 DIAGNOSIS — I110 Hypertensive heart disease with heart failure: Secondary | ICD-10-CM | POA: Diagnosis not present

## 2022-06-30 DIAGNOSIS — Z299 Encounter for prophylactic measures, unspecified: Secondary | ICD-10-CM | POA: Diagnosis not present

## 2022-06-30 DIAGNOSIS — G40909 Epilepsy, unspecified, not intractable, without status epilepticus: Secondary | ICD-10-CM | POA: Diagnosis not present

## 2022-06-30 DIAGNOSIS — I5032 Chronic diastolic (congestive) heart failure: Secondary | ICD-10-CM | POA: Diagnosis not present

## 2022-06-30 DIAGNOSIS — I10 Essential (primary) hypertension: Secondary | ICD-10-CM | POA: Diagnosis not present

## 2022-06-30 DIAGNOSIS — R109 Unspecified abdominal pain: Secondary | ICD-10-CM | POA: Diagnosis not present

## 2022-06-30 DIAGNOSIS — E119 Type 2 diabetes mellitus without complications: Secondary | ICD-10-CM | POA: Diagnosis not present

## 2022-07-06 DIAGNOSIS — Z299 Encounter for prophylactic measures, unspecified: Secondary | ICD-10-CM | POA: Diagnosis not present

## 2022-07-06 DIAGNOSIS — D485 Neoplasm of uncertain behavior of skin: Secondary | ICD-10-CM | POA: Diagnosis not present

## 2022-07-06 DIAGNOSIS — L821 Other seborrheic keratosis: Secondary | ICD-10-CM | POA: Diagnosis not present

## 2022-07-06 DIAGNOSIS — I10 Essential (primary) hypertension: Secondary | ICD-10-CM | POA: Diagnosis not present

## 2022-07-17 ENCOUNTER — Ambulatory Visit: Payer: Medicare HMO | Attending: Cardiology | Admitting: Cardiology

## 2022-07-17 ENCOUNTER — Encounter: Payer: Self-pay | Admitting: Cardiology

## 2022-07-17 VITALS — BP 130/78 | HR 71 | Ht 69.0 in | Wt 213.8 lb

## 2022-07-17 DIAGNOSIS — R600 Localized edema: Secondary | ICD-10-CM

## 2022-07-17 DIAGNOSIS — I10 Essential (primary) hypertension: Secondary | ICD-10-CM | POA: Diagnosis not present

## 2022-07-17 DIAGNOSIS — J449 Chronic obstructive pulmonary disease, unspecified: Secondary | ICD-10-CM | POA: Diagnosis not present

## 2022-07-17 DIAGNOSIS — F339 Major depressive disorder, recurrent, unspecified: Secondary | ICD-10-CM | POA: Diagnosis not present

## 2022-07-17 DIAGNOSIS — I700 Atherosclerosis of aorta: Secondary | ICD-10-CM | POA: Diagnosis not present

## 2022-07-17 DIAGNOSIS — E1165 Type 2 diabetes mellitus with hyperglycemia: Secondary | ICD-10-CM | POA: Diagnosis not present

## 2022-07-17 DIAGNOSIS — E782 Mixed hyperlipidemia: Secondary | ICD-10-CM

## 2022-07-17 DIAGNOSIS — G4733 Obstructive sleep apnea (adult) (pediatric): Secondary | ICD-10-CM

## 2022-07-17 DIAGNOSIS — Z299 Encounter for prophylactic measures, unspecified: Secondary | ICD-10-CM | POA: Diagnosis not present

## 2022-07-17 MED ORDER — ATORVASTATIN CALCIUM 40 MG PO TABS: 40.0000 mg | ORAL_TABLET | Freq: Every day | ORAL | 6 refills | Status: DC

## 2022-07-17 NOTE — Addendum Note (Signed)
Addended by: Laurine Blazer on: 07/17/2022 08:46 AM   Modules accepted: Orders

## 2022-07-17 NOTE — Progress Notes (Signed)
Clinical Summary Derrick Bender is a 77 y.o.male seen today for follow up of the following medical problems.   1.LE edema - 2017 echo LVEF 65-70%, no WMAs, grade Idd, mild MR and AI 09/2021 echo: LVEF 60-65%, no WMAs, grade I dd, normal RV, mild MR, mild to mod AI - started lasix 20mg  daily - don't see that he went for labs since last visit - swelling has improved since last visit    05/2022 [facility] with bacteremia, lasix was stopped at discharge - no recent troubles. He is not sure if continued lasix or not at home. Unclear why was discontinued during admission, his Cr was at baseline.      2. OSA - he is compliant with meds - sleep study 2020, severe OSA   3. HTN - he is compliant with meds however has not taken yet today  05/2022 admission [facility] lisinopril stopped, though he is unsure if still taking.    4. Hyperlipidemia - 01/2022 TC 175 TG 107 HDL 63 LDL 93     5. History of TIA - has been on plavix   6. COPD - followed by primary  Past Medical History:  Diagnosis Date   Asthma    COPD (chronic obstructive pulmonary disease)    CVA (cerebral vascular accident) 01/2011   [facility]   Depression    Diabetes    Dysarthria    Hyperlipidemia    Hypertension    OSA (obstructive sleep apnea)    Prostate cancer    Dr. Exie Bender   Seizure    TIA (transient ischemic attack)      Allergies  Allergen Reactions   Sulfamethoxazole Dermatitis     Current Outpatient Medications  Medication Sig Dispense Refill   albuterol (PROAIR HFA) 108 (90 Base) MCG/ACT inhaler Inhale 2 puffs into the lungs every 6 (six) hours as needed.     ALPRAZolam (XANAX) 0.25 MG tablet Take 0.25 mg by mouth 2 (two) times daily as needed.     budesonide-formoterol (SYMBICORT) 160-4.5 MCG/ACT inhaler Inhale 2 puffs into the lungs daily.     clopidogrel (PLAVIX) 75 MG tablet Take 75 mg by mouth daily.     diazepam (VALIUM) 5 MG tablet Take 5 mg by mouth 2 (two) times daily as needed.      diltiazem (TIAZAC) 240 MG 24 hr capsule Take 1 capsule (240 mg total) by mouth daily. 30 capsule 0   donepezil (ARICEPT) 5 MG tablet Take 5 mg by mouth daily.     escitalopram (LEXAPRO) 20 MG tablet Take 20 mg by mouth at bedtime.     furosemide (LASIX) 20 MG tablet TAKE 1 TABLET BY MOUTH EVERY DAY 90 tablet 3   isosorbide mononitrate (IMDUR) 30 MG 24 hr tablet Take 1 tablet (30 mg total) by mouth daily. 30 tablet 0   lisinopril (ZESTRIL) 20 MG tablet Take by mouth.     loperamide (IMODIUM A-D) 2 MG tablet Take 1 tablet (2 mg total) by mouth 4 (four) times daily as needed for diarrhea or loose stools. 15 tablet 0   Melatonin 10 MG TABS Take 10 mg by mouth at bedtime.     mirtazapine (REMERON) 7.5 MG tablet Take 7.5 mg by mouth at bedtime.     oxybutynin (DITROPAN) 5 MG tablet Take 1 tablet by mouth 3 (three) times daily.     pantoprazole (PROTONIX) 40 MG tablet Take 40 mg by mouth daily.     phenytoin (  DILANTIN) 100 MG ER capsule Take 100 mg by mouth 3 (three) times daily.     predniSONE (DELTASONE) 10 MG tablet Take 1 tablet (10 mg total) by mouth daily. Once you finish the prescribed prednisone taper given at the hospital you can continue this medication as before if you were supposed to be on it chronically.  Check with your primary care physician.     predniSONE (DELTASONE) 5 MG tablet Label  & dispense according to the schedule below. take 8 Pills PO for 3 days, 6 Pills PO for 3 days, 4 Pills PO for 3 days, 2 Pills PO for 3 days, then continue your previous home dose of prednisone if your family physician asks you to do so, call your family physician 2 days prior to this course is finishing to cross check. Total 65 pills. 65 tablet 0   simvastatin (ZOCOR) 10 MG tablet Take 10 mg by mouth daily.     simvastatin (ZOCOR) 20 MG tablet Take 20 mg by mouth at bedtime.     Testosterone 12.5 MG/ACT (1%) GEL Apply 1.25 g topically daily.  4   topiramate (TOPAMAX) 100 MG tablet Take 100 mg by mouth 2  (two) times daily.     No current facility-administered medications for this visit.     Past Surgical History:  Procedure Laterality Date   XI ROBOTIC ASSISTED SIMPLE PROSTATECTOMY  03/2013   Dr. Brendia Bender     Allergies  Allergen Reactions   Sulfamethoxazole Dermatitis      Family History  Problem Relation Age of Onset   Bone cancer Mother    Pancreatic cancer Father      Social History Mr. Derrick Bender reports that he quit smoking about 27 years ago. His smoking use included cigarettes. He started smoking about 62 years ago. He has a 18.50 pack-year smoking history. He has never been exposed to tobacco smoke. He has never used smokeless tobacco. Mr. Derrick Bender has no history on file for alcohol use.   Review of Systems CONSTITUTIONAL: No weight loss, fever, chills, weakness or fatigue.  HEENT: Eyes: No visual loss, blurred vision, double vision or yellow sclerae.No hearing loss, sneezing, congestion, runny nose or sore throat.  SKIN: No rash or itching.  CARDIOVASCULAR: per hpi RESPIRATORY: No shortness of breath, cough or sputum.  GASTROINTESTINAL: No anorexia, nausea, vomiting or diarrhea. No abdominal pain or blood.  GENITOURINARY: No burning on urination, no polyuria NEUROLOGICAL: No headache, dizziness, syncope, paralysis, ataxia, numbness or tingling in the extremities. No change in bowel or bladder control.  MUSCULOSKELETAL: No muscle, back pain, joint pain or stiffness.  LYMPHATICS: No enlarged nodes. No history of splenectomy.  PSYCHIATRIC: No history of depression or anxiety.  ENDOCRINOLOGIC: No reports of sweating, cold or heat intolerance. No polyuria or polydipsia.  Marland Kitchen   Physical Examination Today's Vitals   07/17/22 0816 07/17/22 0838  BP: (!) 140/80 130/78  Pulse: 71   SpO2: 96%   Weight: 213 lb 12.8 oz (97 kg)   Height: 5\' 9"  (1.753 m)    Body mass index is 31.57 kg/m.  Gen: resting comfortably, no acute distress HEENT: no scleral icterus, pupils equal  round and reactive, no palptable cervical adenopathy,  CV: RRR, no m/rg, no jvd Resp: Clear to auscultation bilaterally GI: abdomen is soft, non-tender, non-distended, normal bowel sounds, no hepatosplenomegaly MSK: extremities are warm, no edema.  Skin: warm, no rash Neuro:  no focal deficits Psych: appropriate affect   Diagnostic Studies  12/2015 nuclear stress  No diagnostic ST segment changes to indicate ischemia. Occasional PVCs. Small, mild intensity, inferior defect that is fixed and most prominent at rest, consistent with soft tissue attenuation. No large ischemic territories. This is a low risk study. Nuclear stress EF: 65%.   09/2021 echo   IMPRESSIONS     1. Left ventricular ejection fraction, by estimation, is 60 to 65%. The  left ventricle has normal function. The left ventricle has no regional  wall motion abnormalities. Left ventricular diastolic parameters are  consistent with Grade I diastolic  dysfunction (impaired relaxation).   2. Right ventricular systolic function is normal. The right ventricular  size is normal. Tricuspid regurgitation signal is inadequate for assessing  PA pressure.   3. The mitral valve is abnormal. Mild mitral valve regurgitation. No  evidence of mitral stenosis.   4. The tricuspid valve is abnormal.   5. The aortic valve is tricuspid. There is mild calcification of the  aortic valve. There is mild thickening of the aortic valve. Aortic valve  regurgitation is mild to moderate. No aortic stenosis is present.      Assessment and Plan  LE edema - echo overall benign findings - unclear if still taking lasix at home or note, he will call office and update [place].From d/c summary was discontineud but unclear reason as Cr was at baseline.  - no edema today  2. HTN - bp at goal, clarify if taking home lisinopril or not. He will call our office back  3. Hyperlipidemia - history of TIA, LDL is not at goal. Stop simvastatin, start  atorvastatin 40mg  daily  4. OSA - refer to pulmonary, has not been followed by a provider - severe OSA by 2020 study in epic      Arnoldo Lenis, M.D.

## 2022-07-17 NOTE — Patient Instructions (Addendum)
Medication Instructions:  Stop Simvastatin (Zocor) Begin Atorvastatin 40mg  daily  Continue all other medications.     Labwork: none  Testing/Procedures: none  Follow-Up: 6 months   Any Other Special Instructions Will Be Listed Below (If Applicable). You have been referred to:  Pulmonology    If you need a refill on your cardiac medications before your next appointment, please call your pharmacy.

## 2022-07-25 DIAGNOSIS — D649 Anemia, unspecified: Secondary | ICD-10-CM | POA: Diagnosis not present

## 2022-07-27 DIAGNOSIS — Z125 Encounter for screening for malignant neoplasm of prostate: Secondary | ICD-10-CM | POA: Diagnosis not present

## 2022-07-27 DIAGNOSIS — Z1339 Encounter for screening examination for other mental health and behavioral disorders: Secondary | ICD-10-CM | POA: Diagnosis not present

## 2022-07-27 DIAGNOSIS — E6609 Other obesity due to excess calories: Secondary | ICD-10-CM | POA: Diagnosis not present

## 2022-07-27 DIAGNOSIS — Z87891 Personal history of nicotine dependence: Secondary | ICD-10-CM | POA: Diagnosis not present

## 2022-07-27 DIAGNOSIS — E7800 Pure hypercholesterolemia, unspecified: Secondary | ICD-10-CM | POA: Diagnosis not present

## 2022-07-27 DIAGNOSIS — Z7189 Other specified counseling: Secondary | ICD-10-CM | POA: Diagnosis not present

## 2022-07-27 DIAGNOSIS — R5383 Other fatigue: Secondary | ICD-10-CM | POA: Diagnosis not present

## 2022-07-27 DIAGNOSIS — Z1331 Encounter for screening for depression: Secondary | ICD-10-CM | POA: Diagnosis not present

## 2022-07-27 DIAGNOSIS — Z299 Encounter for prophylactic measures, unspecified: Secondary | ICD-10-CM | POA: Diagnosis not present

## 2022-07-27 DIAGNOSIS — I10 Essential (primary) hypertension: Secondary | ICD-10-CM | POA: Diagnosis not present

## 2022-07-27 DIAGNOSIS — Z79899 Other long term (current) drug therapy: Secondary | ICD-10-CM | POA: Diagnosis not present

## 2022-07-27 DIAGNOSIS — Z0000 Encounter for general adult medical examination without abnormal findings: Secondary | ICD-10-CM | POA: Diagnosis not present

## 2022-07-27 DIAGNOSIS — Z6833 Body mass index (BMI) 33.0-33.9, adult: Secondary | ICD-10-CM | POA: Diagnosis not present

## 2022-08-01 DIAGNOSIS — R0602 Shortness of breath: Secondary | ICD-10-CM | POA: Diagnosis not present

## 2022-08-01 DIAGNOSIS — Z20822 Contact with and (suspected) exposure to covid-19: Secondary | ICD-10-CM | POA: Diagnosis not present

## 2022-08-01 DIAGNOSIS — J449 Chronic obstructive pulmonary disease, unspecified: Secondary | ICD-10-CM | POA: Diagnosis not present

## 2022-08-21 DIAGNOSIS — M5416 Radiculopathy, lumbar region: Secondary | ICD-10-CM | POA: Diagnosis not present

## 2022-08-21 DIAGNOSIS — E119 Type 2 diabetes mellitus without complications: Secondary | ICD-10-CM | POA: Diagnosis not present

## 2022-09-01 ENCOUNTER — Encounter: Payer: Self-pay | Admitting: Pulmonary Disease

## 2022-09-01 ENCOUNTER — Ambulatory Visit (INDEPENDENT_AMBULATORY_CARE_PROVIDER_SITE_OTHER): Payer: Medicare HMO | Admitting: Pulmonary Disease

## 2022-09-01 VITALS — BP 133/77 | HR 69 | Ht 69.0 in | Wt 218.2 lb

## 2022-09-01 DIAGNOSIS — G4733 Obstructive sleep apnea (adult) (pediatric): Secondary | ICD-10-CM | POA: Diagnosis not present

## 2022-09-01 DIAGNOSIS — J449 Chronic obstructive pulmonary disease, unspecified: Secondary | ICD-10-CM

## 2022-09-01 DIAGNOSIS — J849 Interstitial pulmonary disease, unspecified: Secondary | ICD-10-CM

## 2022-09-01 NOTE — Assessment & Plan Note (Signed)
He has severe OSA.  He has been maintained on CPAP for more than 10 years.  He is very compliant and CPAP is definitely helped improve his daytime somnolence and fatigue. 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 Will go ahead and send in a prescription for auto CPAP 5 to 15 cm with nasal mask to DME.  He will need to be established with a new DME since we do not know home he has been following with study can get new supplies  Weight loss encouraged, compliance with goal of at least 4-6 hrs every night is the expectation. Advised against medications with sedative side effects Cautioned against driving when sleepy - understanding that sleepiness will vary on a day to day basis

## 2022-09-01 NOTE — Addendum Note (Signed)
Addended by: Jaynee Eagles on: 09/01/2022 10:19 AM   Modules accepted: Orders

## 2022-09-01 NOTE — Assessment & Plan Note (Signed)
His PFTs dating back to 2009 show moderate restriction, DLCO is decreased but corrects for alveolar volume.  Imaging dating back to 2021 shows groundglass opacities which may be post-COVID He has been maintained on low-dose prednisone for some years.  I discussed tapering prednisone to off but he states that at his advanced age he does not want to mess with this.  Previous attempts has caused him severe weakness likely because he is adrenally insufficient and he does not want to go through that feeling anymore  We will reassess with PFTs and serial CT scans.  At this time he would like to continue with 5 mg prednisone

## 2022-09-01 NOTE — Progress Notes (Signed)
Subjective:    Patient ID: Derrick Bender, male    DOB: 1945/11/11, 77 y.o.   MRN: [id number]  HPI  Chief Complaint  Patient presents with   Consult    Pt consult states that he has been on CPAP now for 8-39yrs, he is unaware of what DME company supplied his machine and hasn't received new supplies ever. Does have ongoing SOB >52yrs. Had sleep study in 5029   77 year old remote smoker presents for evaluation of OSA and "COPD" He reports a lifelong history of asthma.  He smoked about half pack per day as a young adult until he quit in 1997, about 20 pack years.  He worked in [employer].  For some reason he has been maintained on prednisone 5 to 10 mg for almost 20 years by his PCP review of imaging studies shows scattered groundglass infiltrates dating back to 2021 and present on more recent CT imaging in February 2024. He was admitted to [facility] for diverticular abscess causing Bacteroides bacteremia and treated with antibiotics  Epworth Sleepiness Scale is 0.  He was diagnosed with sleep apnea as far back as 2012 and has been maintained on CPAP machine since then.  He is very compliant and CPAP has helped improve his daytime somnolence and fatigue.  He has a [employer] for which she has not been receiving supplies, he settled down with a nasal mask. Up with sleepiness score is 0.  Bedtime is around 10 PM he sleeps on his back with 1 pillow, reports 2-3 nocturnal awakenings and is out of bed at 6:30 AM feeling rested without dryness of mouth or headaches.  He would like to get heated air There is no history suggestive of cataplexy, sleep paralysis or parasomnias  I reviewed cardiology consultation  Significant tests/ events reviewed  10/2018 NPSG >> AHI 43/h, low sat 89% , wt 227 lbs 03/2011 NPSG >> AHI 29/h, low sat 85%  PFTs 11/2007 >> ratio 83, FVC 53%, TLC 63, DLCO 55 corrects for Va - mod restriction, wt 255  CTA chest 05/2022 - exp images,  scattered GGO 11/2019 CT chest wo con >> areas of irregular scarring, previous CT 06/2017 clear  Echo 09/2021 mod AI, nml RVSF  PMH : seizure d/o Adrenal insufficiency due to long-term steroids   Past Medical History:  Diagnosis Date   Asthma    COPD (chronic obstructive pulmonary disease) (HCC)    CVA (cerebral vascular accident) (HCC) 01/2011   [facility]   Depression    Diabetes (HCC)    Dysarthria    Hyperlipidemia    Hypertension    OSA (obstructive sleep apnea)    Prostate cancer (HCC)    Dr. Nechama Guard   Seizure [facility])    TIA (transient ischemic attack)     Past Surgical History:  Procedure Laterality Date   XI ROBOTIC ASSISTED SIMPLE PROSTATECTOMY  03/2013   Dr. Luane School   Allergies  Allergen Reactions   Sulfamethoxazole Dermatitis   Social History   Socioeconomic History   Marital status: Married    Spouse name: Not on file   Number of children: Not on file   Years of education: Not on file   Highest education level: Not on file  Occupational History   Not on file  Tobacco Use   Smoking status: Former    Packs/day: 0.50    Years: 37.00    Additional pack years: 0.00    Total pack years: 18.50  Types: Cigarettes    Start date: 01/16/1960    Quit date: 04/18/1995    Years since quitting: 27.3    Passive exposure: Never   Smokeless tobacco: Never  Substance and Sexual Activity   Alcohol use: Not on file   Drug use: Not on file   Sexual activity: Not on file  Other Topics Concern   Not on file  Social History Narrative   Not on file   Social Determinants of Health   Financial Resource Strain: Not on file  Food Insecurity: Not on file  Transportation Needs: Not on file  Physical Activity: Not on file  Stress: Not on file  Social Connections: Not on file  Intimate Partner Violence: Not on file    Family History  Problem Relation Age of Onset   Bone cancer Mother    Pancreatic cancer Father       Review of Systems Constitutional: negative for  anorexia, fevers and sweats  Eyes: negative for irritation, redness and visual disturbance  Ears, nose, mouth, throat, and face: negative for earaches, epistaxis, nasal congestion and sore throat  Respiratory: negative for cough,  sputum and wheezing  Cardiovascular: negative for chest pain,  orthopnea, palpitations and syncope  Gastrointestinal: negative for abdominal pain, constipation, diarrhea, melena, nausea and vomiting  Genitourinary:negative for dysuria, frequency and hematuria  Hematologic/lymphatic: negative for bleeding, easy bruising and lymphadenopathy  Musculoskeletal:negative for arthralgias, muscle weakness and stiff joints  Neurological: negative for coordination problems, gait problems, headaches and weakness  Endocrine: negative for diabetic symptoms including polydipsia, polyuria and weight loss     Objective:   Physical Exam  Gen. Pleasant, obese, in no distress, normal affect ENT - no pallor,icterus, no post nasal drip, class 2 airway, steroid facies Neck: No JVD, no thyromegaly, no carotid bruits Lungs: no use of accessory muscles, no dullness to percussion, decreased without rales or rhonchi  Cardiovascular: Rhythm regular, heart sounds  normal, no murmurs or gallops, no peripheral edema Abdomen: soft and non-tender, no hepatosplenomegaly, BS normal. Musculoskeletal: No deformities, no cyanosis or clubbing Neuro:  alert, non focal, no tremors       Assessment & Plan:

## 2022-09-01 NOTE — Patient Instructions (Addendum)
  X AutoCPAP 5-15 cm with nasal mask   X schedule PFTs X Continue 5mg  prednisone

## 2022-09-01 NOTE — Assessment & Plan Note (Signed)
He carries a diagnosis of COPD /asthma and is maintained on Symbicort. Previous PFTs only showed restriction not convinced about this diagnosis. Will repeat PFTs

## 2022-09-05 DIAGNOSIS — M545 Low back pain, unspecified: Secondary | ICD-10-CM | POA: Diagnosis not present

## 2022-09-05 DIAGNOSIS — M7918 Myalgia, other site: Secondary | ICD-10-CM | POA: Diagnosis not present

## 2022-09-05 DIAGNOSIS — G8929 Other chronic pain: Secondary | ICD-10-CM | POA: Diagnosis not present

## 2022-09-05 DIAGNOSIS — M5416 Radiculopathy, lumbar region: Secondary | ICD-10-CM | POA: Diagnosis not present

## 2022-09-05 DIAGNOSIS — M549 Dorsalgia, unspecified: Secondary | ICD-10-CM | POA: Diagnosis not present

## 2022-09-07 DIAGNOSIS — R32 Unspecified urinary incontinence: Secondary | ICD-10-CM | POA: Diagnosis not present

## 2022-09-07 DIAGNOSIS — I10 Essential (primary) hypertension: Secondary | ICD-10-CM | POA: Diagnosis not present

## 2022-09-07 DIAGNOSIS — Z299 Encounter for prophylactic measures, unspecified: Secondary | ICD-10-CM | POA: Diagnosis not present

## 2022-09-07 DIAGNOSIS — M545 Low back pain, unspecified: Secondary | ICD-10-CM | POA: Diagnosis not present

## 2022-09-07 DIAGNOSIS — R350 Frequency of micturition: Secondary | ICD-10-CM | POA: Diagnosis not present

## 2022-09-07 DIAGNOSIS — F419 Anxiety disorder, unspecified: Secondary | ICD-10-CM | POA: Diagnosis not present

## 2022-09-29 DIAGNOSIS — R0602 Shortness of breath: Secondary | ICD-10-CM | POA: Diagnosis not present

## 2022-09-29 DIAGNOSIS — J069 Acute upper respiratory infection, unspecified: Secondary | ICD-10-CM | POA: Diagnosis not present

## 2022-09-29 DIAGNOSIS — R5383 Other fatigue: Secondary | ICD-10-CM | POA: Diagnosis not present

## 2022-09-29 DIAGNOSIS — R059 Cough, unspecified: Secondary | ICD-10-CM | POA: Diagnosis not present

## 2022-09-29 DIAGNOSIS — Z299 Encounter for prophylactic measures, unspecified: Secondary | ICD-10-CM | POA: Diagnosis not present

## 2022-09-29 DIAGNOSIS — I10 Essential (primary) hypertension: Secondary | ICD-10-CM | POA: Diagnosis not present

## 2022-10-02 DIAGNOSIS — R5383 Other fatigue: Secondary | ICD-10-CM | POA: Diagnosis not present

## 2022-10-05 DIAGNOSIS — J209 Acute bronchitis, unspecified: Secondary | ICD-10-CM | POA: Diagnosis not present

## 2022-10-05 DIAGNOSIS — Z299 Encounter for prophylactic measures, unspecified: Secondary | ICD-10-CM | POA: Diagnosis not present

## 2022-10-05 DIAGNOSIS — J440 Chronic obstructive pulmonary disease with acute lower respiratory infection: Secondary | ICD-10-CM | POA: Diagnosis not present

## 2022-10-05 DIAGNOSIS — I10 Essential (primary) hypertension: Secondary | ICD-10-CM | POA: Diagnosis not present

## 2022-10-13 ENCOUNTER — Encounter (HOSPITAL_COMMUNITY): Payer: Medicare HMO

## 2022-10-13 ENCOUNTER — Ambulatory Visit (HOSPITAL_COMMUNITY)
Admission: RE | Admit: 2022-10-13 | Discharge: 2022-10-13 | Disposition: A | Discharge status: Home / Self Care | Payer: Medicare HMO | Source: Ambulatory Visit | Attending: Pulmonary Disease | Admitting: Pulmonary Disease

## 2022-10-13 DIAGNOSIS — J849 Interstitial pulmonary disease, unspecified: Secondary | ICD-10-CM | POA: Insufficient documentation

## 2022-10-13 LAB — PULMONARY FUNCTION TEST
DL/VA % pred: 129 %
DL/VA: 5.03 ml/min/mmHg/L
DLCO unc % pred: 57 %
DLCO unc: 13.82 ml/min/mmHg
FEF 25-75 Post: 2.3 L/sec
FEF 25-75 Pre: 2.37 L/sec
FEF2575-%Change-Post: -2 %
FEF2575-%Pred-Post: 113 %
FEF2575-%Pred-Pre: 116 %
FEV1-%Change-Post: -1 %
FEV1-%Pred-Post: 63 %
FEV1-%Pred-Pre: 64 %
FEV1-Post: 1.83 L
FEV1-Pre: 1.85 L
FEV1FVC-%Change-Post: 2 %
FEV1FVC-%Pred-Pre: 117 %
FEV6-%Change-Post: -4 %
FEV6-%Pred-Post: 56 %
FEV6-%Pred-Pre: 58 %
FEV6-Post: 2.09 L
FEV6-Pre: 2.19 L
FEV6FVC-%Pred-Post: 106 %
FEV6FVC-%Pred-Pre: 106 %
FVC-%Change-Post: -4 %
FVC-%Pred-Post: 52 %
FVC-%Pred-Pre: 54 %
FVC-Post: 2.09 L
FVC-Pre: 2.19 L
Post FEV1/FVC ratio: 87 %
Post FEV6/FVC ratio: 100 %
Pre FEV1/FVC ratio: 85 %
Pre FEV6/FVC Ratio: 100 %
RV % pred: 59 %
RV: 1.51 L
TLC % pred: 52 %
TLC: 3.6 L

## 2022-10-13 MED ORDER — ALBUTEROL SULFATE (2.5 MG/3ML) 0.083% IN NEBU
2.5000 mg | INHALATION_SOLUTION | Freq: Once | RESPIRATORY_TRACT | Status: AC
  Administered 2022-10-13: 2.5 mg via RESPIRATORY_TRACT

## 2022-10-26 DIAGNOSIS — D649 Anemia, unspecified: Secondary | ICD-10-CM | POA: Diagnosis not present

## 2022-10-31 DIAGNOSIS — F1320 Sedative, hypnotic or anxiolytic dependence, uncomplicated: Secondary | ICD-10-CM | POA: Diagnosis not present

## 2022-10-31 DIAGNOSIS — Z299 Encounter for prophylactic measures, unspecified: Secondary | ICD-10-CM | POA: Diagnosis not present

## 2022-10-31 DIAGNOSIS — D849 Immunodeficiency, unspecified: Secondary | ICD-10-CM | POA: Diagnosis not present

## 2022-10-31 DIAGNOSIS — I700 Atherosclerosis of aorta: Secondary | ICD-10-CM | POA: Diagnosis not present

## 2022-10-31 DIAGNOSIS — I10 Essential (primary) hypertension: Secondary | ICD-10-CM | POA: Diagnosis not present

## 2022-10-31 DIAGNOSIS — E1159 Type 2 diabetes mellitus with other circulatory complications: Secondary | ICD-10-CM | POA: Diagnosis not present

## 2022-10-31 DIAGNOSIS — M545 Low back pain, unspecified: Secondary | ICD-10-CM | POA: Diagnosis not present

## 2022-10-31 DIAGNOSIS — I152 Hypertension secondary to endocrine disorders: Secondary | ICD-10-CM | POA: Diagnosis not present

## 2022-11-18 DIAGNOSIS — M545 Low back pain, unspecified: Secondary | ICD-10-CM | POA: Diagnosis not present

## 2022-11-23 DIAGNOSIS — M5416 Radiculopathy, lumbar region: Secondary | ICD-10-CM | POA: Diagnosis not present

## 2022-11-23 DIAGNOSIS — G8929 Other chronic pain: Secondary | ICD-10-CM | POA: Diagnosis not present

## 2022-11-23 DIAGNOSIS — M549 Dorsalgia, unspecified: Secondary | ICD-10-CM | POA: Diagnosis not present

## 2022-11-23 DIAGNOSIS — M7918 Myalgia, other site: Secondary | ICD-10-CM | POA: Diagnosis not present

## 2022-11-23 DIAGNOSIS — M545 Low back pain, unspecified: Secondary | ICD-10-CM | POA: Diagnosis not present

## 2022-12-07 DIAGNOSIS — E119 Type 2 diabetes mellitus without complications: Secondary | ICD-10-CM | POA: Diagnosis not present

## 2022-12-07 DIAGNOSIS — M5416 Radiculopathy, lumbar region: Secondary | ICD-10-CM | POA: Diagnosis not present

## 2022-12-09 ENCOUNTER — Other Ambulatory Visit: Payer: Self-pay | Admitting: Cardiology

## 2022-12-17 DIAGNOSIS — I10 Essential (primary) hypertension: Secondary | ICD-10-CM | POA: Diagnosis not present

## 2022-12-17 DIAGNOSIS — M545 Low back pain, unspecified: Secondary | ICD-10-CM | POA: Diagnosis not present

## 2022-12-21 DIAGNOSIS — E46 Unspecified protein-calorie malnutrition: Secondary | ICD-10-CM | POA: Diagnosis not present

## 2022-12-21 DIAGNOSIS — I10 Essential (primary) hypertension: Secondary | ICD-10-CM | POA: Diagnosis not present

## 2022-12-21 DIAGNOSIS — Z299 Encounter for prophylactic measures, unspecified: Secondary | ICD-10-CM | POA: Diagnosis not present

## 2022-12-21 DIAGNOSIS — D692 Other nonthrombocytopenic purpura: Secondary | ICD-10-CM | POA: Diagnosis not present

## 2022-12-21 DIAGNOSIS — J449 Chronic obstructive pulmonary disease, unspecified: Secondary | ICD-10-CM | POA: Diagnosis not present

## 2022-12-21 DIAGNOSIS — R569 Unspecified convulsions: Secondary | ICD-10-CM | POA: Diagnosis not present

## 2022-12-29 DIAGNOSIS — M6281 Muscle weakness (generalized): Secondary | ICD-10-CM | POA: Diagnosis not present

## 2022-12-29 DIAGNOSIS — M5459 Other low back pain: Secondary | ICD-10-CM | POA: Diagnosis not present

## 2023-01-03 ENCOUNTER — Other Ambulatory Visit: Payer: Self-pay | Admitting: Cardiology

## 2023-01-04 DIAGNOSIS — M549 Dorsalgia, unspecified: Secondary | ICD-10-CM | POA: Diagnosis not present

## 2023-01-04 DIAGNOSIS — M5416 Radiculopathy, lumbar region: Secondary | ICD-10-CM | POA: Diagnosis not present

## 2023-01-04 DIAGNOSIS — G8929 Other chronic pain: Secondary | ICD-10-CM | POA: Diagnosis not present

## 2023-01-04 DIAGNOSIS — M7918 Myalgia, other site: Secondary | ICD-10-CM | POA: Diagnosis not present

## 2023-01-04 DIAGNOSIS — M545 Low back pain, unspecified: Secondary | ICD-10-CM | POA: Diagnosis not present

## 2023-01-10 DIAGNOSIS — M5416 Radiculopathy, lumbar region: Secondary | ICD-10-CM | POA: Diagnosis not present

## 2023-01-10 DIAGNOSIS — M545 Low back pain, unspecified: Secondary | ICD-10-CM | POA: Diagnosis not present

## 2023-01-10 DIAGNOSIS — M48061 Spinal stenosis, lumbar region without neurogenic claudication: Secondary | ICD-10-CM | POA: Diagnosis not present

## 2023-01-18 DIAGNOSIS — I10 Essential (primary) hypertension: Secondary | ICD-10-CM | POA: Diagnosis not present

## 2023-01-18 DIAGNOSIS — Z299 Encounter for prophylactic measures, unspecified: Secondary | ICD-10-CM | POA: Diagnosis not present

## 2023-01-18 DIAGNOSIS — F339 Major depressive disorder, recurrent, unspecified: Secondary | ICD-10-CM | POA: Diagnosis not present

## 2023-01-18 DIAGNOSIS — Z23 Encounter for immunization: Secondary | ICD-10-CM | POA: Diagnosis not present

## 2023-01-18 DIAGNOSIS — E1165 Type 2 diabetes mellitus with hyperglycemia: Secondary | ICD-10-CM | POA: Diagnosis not present

## 2023-01-18 DIAGNOSIS — F419 Anxiety disorder, unspecified: Secondary | ICD-10-CM | POA: Diagnosis not present

## 2023-02-12 DIAGNOSIS — F339 Major depressive disorder, recurrent, unspecified: Secondary | ICD-10-CM | POA: Diagnosis not present

## 2023-02-12 DIAGNOSIS — I10 Essential (primary) hypertension: Secondary | ICD-10-CM | POA: Diagnosis not present

## 2023-02-12 DIAGNOSIS — E1159 Type 2 diabetes mellitus with other circulatory complications: Secondary | ICD-10-CM | POA: Diagnosis not present

## 2023-02-12 DIAGNOSIS — J449 Chronic obstructive pulmonary disease, unspecified: Secondary | ICD-10-CM | POA: Diagnosis not present

## 2023-02-12 DIAGNOSIS — Z0000 Encounter for general adult medical examination without abnormal findings: Secondary | ICD-10-CM | POA: Diagnosis not present

## 2023-02-12 DIAGNOSIS — I152 Hypertension secondary to endocrine disorders: Secondary | ICD-10-CM | POA: Diagnosis not present

## 2023-02-12 DIAGNOSIS — Z299 Encounter for prophylactic measures, unspecified: Secondary | ICD-10-CM | POA: Diagnosis not present

## 2023-02-16 DIAGNOSIS — M5416 Radiculopathy, lumbar region: Secondary | ICD-10-CM | POA: Diagnosis not present

## 2023-02-16 DIAGNOSIS — Z01811 Encounter for preprocedural respiratory examination: Secondary | ICD-10-CM | POA: Diagnosis not present

## 2023-02-16 DIAGNOSIS — M4807 Spinal stenosis, lumbosacral region: Secondary | ICD-10-CM | POA: Diagnosis not present

## 2023-02-16 DIAGNOSIS — M48061 Spinal stenosis, lumbar region without neurogenic claudication: Secondary | ICD-10-CM | POA: Diagnosis not present

## 2023-02-21 DIAGNOSIS — I129 Hypertensive chronic kidney disease with stage 1 through stage 4 chronic kidney disease, or unspecified chronic kidney disease: Secondary | ICD-10-CM | POA: Diagnosis not present

## 2023-02-21 DIAGNOSIS — I10 Essential (primary) hypertension: Secondary | ICD-10-CM | POA: Diagnosis not present

## 2023-02-21 DIAGNOSIS — D72829 Elevated white blood cell count, unspecified: Secondary | ICD-10-CM | POA: Diagnosis not present

## 2023-02-21 DIAGNOSIS — R531 Weakness: Secondary | ICD-10-CM | POA: Diagnosis not present

## 2023-02-21 DIAGNOSIS — N390 Urinary tract infection, site not specified: Secondary | ICD-10-CM | POA: Diagnosis not present

## 2023-02-21 DIAGNOSIS — N182 Chronic kidney disease, stage 2 (mild): Secondary | ICD-10-CM | POA: Diagnosis not present

## 2023-02-21 DIAGNOSIS — F01518 Vascular dementia, unspecified severity, with other behavioral disturbance: Secondary | ICD-10-CM | POA: Diagnosis not present

## 2023-02-21 DIAGNOSIS — M4807 Spinal stenosis, lumbosacral region: Secondary | ICD-10-CM | POA: Diagnosis not present

## 2023-02-21 DIAGNOSIS — F03918 Unspecified dementia, unspecified severity, with other behavioral disturbance: Secondary | ICD-10-CM | POA: Diagnosis not present

## 2023-02-21 DIAGNOSIS — R296 Repeated falls: Secondary | ICD-10-CM | POA: Diagnosis not present

## 2023-02-21 DIAGNOSIS — S0990XA Unspecified injury of head, initial encounter: Secondary | ICD-10-CM | POA: Diagnosis not present

## 2023-02-21 DIAGNOSIS — E119 Type 2 diabetes mellitus without complications: Secondary | ICD-10-CM | POA: Diagnosis not present

## 2023-02-21 DIAGNOSIS — M4726 Other spondylosis with radiculopathy, lumbar region: Secondary | ICD-10-CM | POA: Diagnosis not present

## 2023-02-21 DIAGNOSIS — E1165 Type 2 diabetes mellitus with hyperglycemia: Secondary | ICD-10-CM | POA: Diagnosis not present

## 2023-02-21 DIAGNOSIS — J449 Chronic obstructive pulmonary disease, unspecified: Secondary | ICD-10-CM | POA: Diagnosis not present

## 2023-02-21 DIAGNOSIS — R339 Retention of urine, unspecified: Secondary | ICD-10-CM | POA: Diagnosis not present

## 2023-02-21 DIAGNOSIS — E785 Hyperlipidemia, unspecified: Secondary | ICD-10-CM | POA: Diagnosis not present

## 2023-02-21 DIAGNOSIS — Z7401 Bed confinement status: Secondary | ICD-10-CM | POA: Diagnosis not present

## 2023-02-21 DIAGNOSIS — G40909 Epilepsy, unspecified, not intractable, without status epilepticus: Secondary | ICD-10-CM | POA: Diagnosis not present

## 2023-02-21 DIAGNOSIS — G4733 Obstructive sleep apnea (adult) (pediatric): Secondary | ICD-10-CM | POA: Diagnosis not present

## 2023-02-21 DIAGNOSIS — M5416 Radiculopathy, lumbar region: Secondary | ICD-10-CM | POA: Diagnosis not present

## 2023-02-21 DIAGNOSIS — E1169 Type 2 diabetes mellitus with other specified complication: Secondary | ICD-10-CM | POA: Diagnosis not present

## 2023-02-21 DIAGNOSIS — Z4789 Encounter for other orthopedic aftercare: Secondary | ICD-10-CM | POA: Diagnosis not present

## 2023-02-21 DIAGNOSIS — E1122 Type 2 diabetes mellitus with diabetic chronic kidney disease: Secondary | ICD-10-CM | POA: Diagnosis not present

## 2023-02-21 DIAGNOSIS — M5116 Intervertebral disc disorders with radiculopathy, lumbar region: Secondary | ICD-10-CM | POA: Diagnosis not present

## 2023-02-21 DIAGNOSIS — R42 Dizziness and giddiness: Secondary | ICD-10-CM | POA: Diagnosis not present

## 2023-02-21 DIAGNOSIS — E876 Hypokalemia: Secondary | ICD-10-CM | POA: Diagnosis not present

## 2023-02-21 DIAGNOSIS — N179 Acute kidney failure, unspecified: Secondary | ICD-10-CM | POA: Diagnosis not present

## 2023-02-21 DIAGNOSIS — R059 Cough, unspecified: Secondary | ICD-10-CM | POA: Diagnosis not present

## 2023-02-21 DIAGNOSIS — M5417 Radiculopathy, lumbosacral region: Secondary | ICD-10-CM | POA: Diagnosis not present

## 2023-02-21 DIAGNOSIS — M48061 Spinal stenosis, lumbar region without neurogenic claudication: Secondary | ICD-10-CM | POA: Diagnosis not present

## 2023-02-21 DIAGNOSIS — R569 Unspecified convulsions: Secondary | ICD-10-CM | POA: Diagnosis not present

## 2023-02-22 DIAGNOSIS — M4807 Spinal stenosis, lumbosacral region: Secondary | ICD-10-CM | POA: Diagnosis not present

## 2023-02-23 DIAGNOSIS — M4807 Spinal stenosis, lumbosacral region: Secondary | ICD-10-CM | POA: Diagnosis not present

## 2023-02-23 DIAGNOSIS — R059 Cough, unspecified: Secondary | ICD-10-CM | POA: Diagnosis not present

## 2023-02-24 DIAGNOSIS — M4807 Spinal stenosis, lumbosacral region: Secondary | ICD-10-CM | POA: Diagnosis not present

## 2023-02-24 DIAGNOSIS — S0990XA Unspecified injury of head, initial encounter: Secondary | ICD-10-CM | POA: Diagnosis not present

## 2023-02-25 DIAGNOSIS — M4807 Spinal stenosis, lumbosacral region: Secondary | ICD-10-CM | POA: Diagnosis not present

## 2023-02-26 DIAGNOSIS — I10 Essential (primary) hypertension: Secondary | ICD-10-CM | POA: Diagnosis not present

## 2023-02-26 DIAGNOSIS — J449 Chronic obstructive pulmonary disease, unspecified: Secondary | ICD-10-CM | POA: Diagnosis not present

## 2023-02-26 DIAGNOSIS — N400 Enlarged prostate without lower urinary tract symptoms: Secondary | ICD-10-CM | POA: Diagnosis not present

## 2023-02-26 DIAGNOSIS — R569 Unspecified convulsions: Secondary | ICD-10-CM | POA: Diagnosis not present

## 2023-02-26 DIAGNOSIS — Z4789 Encounter for other orthopedic aftercare: Secondary | ICD-10-CM | POA: Diagnosis not present

## 2023-02-26 DIAGNOSIS — Z7401 Bed confinement status: Secondary | ICD-10-CM | POA: Diagnosis not present

## 2023-02-26 DIAGNOSIS — E1169 Type 2 diabetes mellitus with other specified complication: Secondary | ICD-10-CM | POA: Diagnosis not present

## 2023-02-26 DIAGNOSIS — M48061 Spinal stenosis, lumbar region without neurogenic claudication: Secondary | ICD-10-CM | POA: Diagnosis not present

## 2023-02-26 DIAGNOSIS — R296 Repeated falls: Secondary | ICD-10-CM | POA: Diagnosis not present

## 2023-02-26 DIAGNOSIS — R531 Weakness: Secondary | ICD-10-CM | POA: Diagnosis not present

## 2023-02-26 DIAGNOSIS — G459 Transient cerebral ischemic attack, unspecified: Secondary | ICD-10-CM | POA: Diagnosis not present

## 2023-02-26 DIAGNOSIS — F03918 Unspecified dementia, unspecified severity, with other behavioral disturbance: Secondary | ICD-10-CM | POA: Diagnosis not present

## 2023-02-26 DIAGNOSIS — E785 Hyperlipidemia, unspecified: Secondary | ICD-10-CM | POA: Diagnosis not present

## 2023-02-26 DIAGNOSIS — T703XXA Caisson disease [decompression sickness], initial encounter: Secondary | ICD-10-CM | POA: Diagnosis not present

## 2023-02-26 DIAGNOSIS — E119 Type 2 diabetes mellitus without complications: Secondary | ICD-10-CM | POA: Diagnosis not present

## 2023-02-26 DIAGNOSIS — F0390 Unspecified dementia without behavioral disturbance: Secondary | ICD-10-CM | POA: Diagnosis not present

## 2023-02-26 DIAGNOSIS — G40909 Epilepsy, unspecified, not intractable, without status epilepticus: Secondary | ICD-10-CM | POA: Diagnosis not present

## 2023-02-26 DIAGNOSIS — D72829 Elevated white blood cell count, unspecified: Secondary | ICD-10-CM | POA: Diagnosis not present

## 2023-02-26 DIAGNOSIS — E876 Hypokalemia: Secondary | ICD-10-CM | POA: Diagnosis not present

## 2023-02-26 DIAGNOSIS — M4807 Spinal stenosis, lumbosacral region: Secondary | ICD-10-CM | POA: Diagnosis not present

## 2023-02-26 DIAGNOSIS — N3281 Overactive bladder: Secondary | ICD-10-CM | POA: Diagnosis not present

## 2023-02-26 DIAGNOSIS — N390 Urinary tract infection, site not specified: Secondary | ICD-10-CM | POA: Diagnosis not present

## 2023-02-26 DIAGNOSIS — N182 Chronic kidney disease, stage 2 (mild): Secondary | ICD-10-CM | POA: Diagnosis not present

## 2023-02-26 DIAGNOSIS — M5116 Intervertebral disc disorders with radiculopathy, lumbar region: Secondary | ICD-10-CM | POA: Diagnosis not present

## 2023-02-27 DIAGNOSIS — F03918 Unspecified dementia, unspecified severity, with other behavioral disturbance: Secondary | ICD-10-CM | POA: Diagnosis not present

## 2023-02-27 DIAGNOSIS — E785 Hyperlipidemia, unspecified: Secondary | ICD-10-CM | POA: Diagnosis not present

## 2023-02-27 DIAGNOSIS — I10 Essential (primary) hypertension: Secondary | ICD-10-CM | POA: Diagnosis not present

## 2023-02-27 DIAGNOSIS — E1169 Type 2 diabetes mellitus with other specified complication: Secondary | ICD-10-CM | POA: Diagnosis not present

## 2023-03-01 DIAGNOSIS — G459 Transient cerebral ischemic attack, unspecified: Secondary | ICD-10-CM | POA: Diagnosis not present

## 2023-03-01 DIAGNOSIS — E785 Hyperlipidemia, unspecified: Secondary | ICD-10-CM | POA: Diagnosis not present

## 2023-03-02 DIAGNOSIS — N400 Enlarged prostate without lower urinary tract symptoms: Secondary | ICD-10-CM | POA: Diagnosis not present

## 2023-03-02 DIAGNOSIS — I10 Essential (primary) hypertension: Secondary | ICD-10-CM | POA: Diagnosis not present

## 2023-03-02 DIAGNOSIS — N3281 Overactive bladder: Secondary | ICD-10-CM | POA: Diagnosis not present

## 2023-03-05 DIAGNOSIS — I10 Essential (primary) hypertension: Secondary | ICD-10-CM | POA: Diagnosis not present

## 2023-03-05 DIAGNOSIS — N400 Enlarged prostate without lower urinary tract symptoms: Secondary | ICD-10-CM | POA: Diagnosis not present

## 2023-03-05 DIAGNOSIS — M48061 Spinal stenosis, lumbar region without neurogenic claudication: Secondary | ICD-10-CM | POA: Diagnosis not present

## 2023-03-05 DIAGNOSIS — N3281 Overactive bladder: Secondary | ICD-10-CM | POA: Diagnosis not present

## 2023-03-08 DIAGNOSIS — E785 Hyperlipidemia, unspecified: Secondary | ICD-10-CM | POA: Diagnosis not present

## 2023-03-08 DIAGNOSIS — E1169 Type 2 diabetes mellitus with other specified complication: Secondary | ICD-10-CM | POA: Diagnosis not present

## 2023-03-08 DIAGNOSIS — F03918 Unspecified dementia, unspecified severity, with other behavioral disturbance: Secondary | ICD-10-CM | POA: Diagnosis not present

## 2023-03-08 DIAGNOSIS — I10 Essential (primary) hypertension: Secondary | ICD-10-CM | POA: Diagnosis not present

## 2023-03-14 DIAGNOSIS — T703XXA Caisson disease [decompression sickness], initial encounter: Secondary | ICD-10-CM | POA: Diagnosis not present

## 2023-03-15 DIAGNOSIS — I10 Essential (primary) hypertension: Secondary | ICD-10-CM | POA: Diagnosis not present

## 2023-03-15 DIAGNOSIS — F03918 Unspecified dementia, unspecified severity, with other behavioral disturbance: Secondary | ICD-10-CM | POA: Diagnosis not present

## 2023-03-15 DIAGNOSIS — E1169 Type 2 diabetes mellitus with other specified complication: Secondary | ICD-10-CM | POA: Diagnosis not present

## 2023-03-15 DIAGNOSIS — E785 Hyperlipidemia, unspecified: Secondary | ICD-10-CM | POA: Diagnosis not present

## 2023-03-18 DIAGNOSIS — D72829 Elevated white blood cell count, unspecified: Secondary | ICD-10-CM | POA: Diagnosis not present

## 2023-03-18 DIAGNOSIS — E876 Hypokalemia: Secondary | ICD-10-CM | POA: Diagnosis not present

## 2023-03-18 DIAGNOSIS — E785 Hyperlipidemia, unspecified: Secondary | ICD-10-CM | POA: Diagnosis not present

## 2023-03-18 DIAGNOSIS — J449 Chronic obstructive pulmonary disease, unspecified: Secondary | ICD-10-CM | POA: Diagnosis not present

## 2023-03-18 DIAGNOSIS — F0390 Unspecified dementia without behavioral disturbance: Secondary | ICD-10-CM | POA: Diagnosis not present

## 2023-03-18 DIAGNOSIS — R296 Repeated falls: Secondary | ICD-10-CM | POA: Diagnosis not present

## 2023-03-18 DIAGNOSIS — M48061 Spinal stenosis, lumbar region without neurogenic claudication: Secondary | ICD-10-CM | POA: Diagnosis not present

## 2023-03-18 DIAGNOSIS — E119 Type 2 diabetes mellitus without complications: Secondary | ICD-10-CM | POA: Diagnosis not present

## 2023-03-19 DIAGNOSIS — F0154 Vascular dementia, unspecified severity, with anxiety: Secondary | ICD-10-CM | POA: Diagnosis not present

## 2023-03-19 DIAGNOSIS — F0153 Vascular dementia, unspecified severity, with mood disturbance: Secondary | ICD-10-CM | POA: Diagnosis not present

## 2023-03-19 DIAGNOSIS — E1122 Type 2 diabetes mellitus with diabetic chronic kidney disease: Secondary | ICD-10-CM | POA: Diagnosis not present

## 2023-03-19 DIAGNOSIS — Z4789 Encounter for other orthopedic aftercare: Secondary | ICD-10-CM | POA: Diagnosis not present

## 2023-03-19 DIAGNOSIS — N182 Chronic kidney disease, stage 2 (mild): Secondary | ICD-10-CM | POA: Diagnosis not present

## 2023-03-19 DIAGNOSIS — I129 Hypertensive chronic kidney disease with stage 1 through stage 4 chronic kidney disease, or unspecified chronic kidney disease: Secondary | ICD-10-CM | POA: Diagnosis not present

## 2023-03-20 DIAGNOSIS — F0153 Vascular dementia, unspecified severity, with mood disturbance: Secondary | ICD-10-CM | POA: Diagnosis not present

## 2023-03-20 DIAGNOSIS — I129 Hypertensive chronic kidney disease with stage 1 through stage 4 chronic kidney disease, or unspecified chronic kidney disease: Secondary | ICD-10-CM | POA: Diagnosis not present

## 2023-03-20 DIAGNOSIS — N182 Chronic kidney disease, stage 2 (mild): Secondary | ICD-10-CM | POA: Diagnosis not present

## 2023-03-20 DIAGNOSIS — E1122 Type 2 diabetes mellitus with diabetic chronic kidney disease: Secondary | ICD-10-CM | POA: Diagnosis not present

## 2023-03-20 DIAGNOSIS — Z4789 Encounter for other orthopedic aftercare: Secondary | ICD-10-CM | POA: Diagnosis not present

## 2023-03-20 DIAGNOSIS — F0154 Vascular dementia, unspecified severity, with anxiety: Secondary | ICD-10-CM | POA: Diagnosis not present

## 2023-03-26 DIAGNOSIS — C61 Malignant neoplasm of prostate: Secondary | ICD-10-CM | POA: Diagnosis not present

## 2023-03-26 DIAGNOSIS — R32 Unspecified urinary incontinence: Secondary | ICD-10-CM | POA: Diagnosis not present

## 2023-03-26 DIAGNOSIS — R310 Gross hematuria: Secondary | ICD-10-CM | POA: Diagnosis not present

## 2023-03-26 DIAGNOSIS — N35919 Unspecified urethral stricture, male, unspecified site: Secondary | ICD-10-CM | POA: Diagnosis not present

## 2023-03-26 DIAGNOSIS — R338 Other retention of urine: Secondary | ICD-10-CM | POA: Diagnosis not present

## 2023-03-27 DIAGNOSIS — F0153 Vascular dementia, unspecified severity, with mood disturbance: Secondary | ICD-10-CM | POA: Diagnosis not present

## 2023-03-27 DIAGNOSIS — E1122 Type 2 diabetes mellitus with diabetic chronic kidney disease: Secondary | ICD-10-CM | POA: Diagnosis not present

## 2023-03-27 DIAGNOSIS — F0154 Vascular dementia, unspecified severity, with anxiety: Secondary | ICD-10-CM | POA: Diagnosis not present

## 2023-03-27 DIAGNOSIS — Z4789 Encounter for other orthopedic aftercare: Secondary | ICD-10-CM | POA: Diagnosis not present

## 2023-03-27 DIAGNOSIS — I129 Hypertensive chronic kidney disease with stage 1 through stage 4 chronic kidney disease, or unspecified chronic kidney disease: Secondary | ICD-10-CM | POA: Diagnosis not present

## 2023-03-27 DIAGNOSIS — N182 Chronic kidney disease, stage 2 (mild): Secondary | ICD-10-CM | POA: Diagnosis not present

## 2023-03-29 DIAGNOSIS — R310 Gross hematuria: Secondary | ICD-10-CM | POA: Diagnosis not present

## 2023-03-29 DIAGNOSIS — N35919 Unspecified urethral stricture, male, unspecified site: Secondary | ICD-10-CM | POA: Diagnosis not present

## 2023-03-29 DIAGNOSIS — R32 Unspecified urinary incontinence: Secondary | ICD-10-CM | POA: Diagnosis not present

## 2023-03-29 DIAGNOSIS — C61 Malignant neoplasm of prostate: Secondary | ICD-10-CM | POA: Diagnosis not present

## 2023-03-29 DIAGNOSIS — R338 Other retention of urine: Secondary | ICD-10-CM | POA: Diagnosis not present

## 2023-04-03 DIAGNOSIS — Z4789 Encounter for other orthopedic aftercare: Secondary | ICD-10-CM | POA: Diagnosis not present

## 2023-04-03 DIAGNOSIS — N182 Chronic kidney disease, stage 2 (mild): Secondary | ICD-10-CM | POA: Diagnosis not present

## 2023-04-03 DIAGNOSIS — I129 Hypertensive chronic kidney disease with stage 1 through stage 4 chronic kidney disease, or unspecified chronic kidney disease: Secondary | ICD-10-CM | POA: Diagnosis not present

## 2023-04-03 DIAGNOSIS — F0154 Vascular dementia, unspecified severity, with anxiety: Secondary | ICD-10-CM | POA: Diagnosis not present

## 2023-04-03 DIAGNOSIS — F0153 Vascular dementia, unspecified severity, with mood disturbance: Secondary | ICD-10-CM | POA: Diagnosis not present

## 2023-04-03 DIAGNOSIS — E1122 Type 2 diabetes mellitus with diabetic chronic kidney disease: Secondary | ICD-10-CM | POA: Diagnosis not present

## 2023-04-04 DIAGNOSIS — F0153 Vascular dementia, unspecified severity, with mood disturbance: Secondary | ICD-10-CM | POA: Diagnosis not present

## 2023-04-04 DIAGNOSIS — E1122 Type 2 diabetes mellitus with diabetic chronic kidney disease: Secondary | ICD-10-CM | POA: Diagnosis not present

## 2023-04-04 DIAGNOSIS — N182 Chronic kidney disease, stage 2 (mild): Secondary | ICD-10-CM | POA: Diagnosis not present

## 2023-04-04 DIAGNOSIS — Z4789 Encounter for other orthopedic aftercare: Secondary | ICD-10-CM | POA: Diagnosis not present

## 2023-04-04 DIAGNOSIS — F0154 Vascular dementia, unspecified severity, with anxiety: Secondary | ICD-10-CM | POA: Diagnosis not present

## 2023-04-04 DIAGNOSIS — I129 Hypertensive chronic kidney disease with stage 1 through stage 4 chronic kidney disease, or unspecified chronic kidney disease: Secondary | ICD-10-CM | POA: Diagnosis not present

## 2023-04-06 DIAGNOSIS — Z299 Encounter for prophylactic measures, unspecified: Secondary | ICD-10-CM | POA: Diagnosis not present

## 2023-04-06 DIAGNOSIS — M79605 Pain in left leg: Secondary | ICD-10-CM | POA: Diagnosis not present

## 2023-04-06 DIAGNOSIS — R29898 Other symptoms and signs involving the musculoskeletal system: Secondary | ICD-10-CM | POA: Diagnosis not present

## 2023-04-06 DIAGNOSIS — I82412 Acute embolism and thrombosis of left femoral vein: Secondary | ICD-10-CM | POA: Diagnosis not present

## 2023-04-06 DIAGNOSIS — I10 Essential (primary) hypertension: Secondary | ICD-10-CM | POA: Diagnosis not present

## 2023-04-13 DIAGNOSIS — Z4789 Encounter for other orthopedic aftercare: Secondary | ICD-10-CM | POA: Diagnosis not present

## 2023-04-13 DIAGNOSIS — F0154 Vascular dementia, unspecified severity, with anxiety: Secondary | ICD-10-CM | POA: Diagnosis not present

## 2023-04-13 DIAGNOSIS — N182 Chronic kidney disease, stage 2 (mild): Secondary | ICD-10-CM | POA: Diagnosis not present

## 2023-04-13 DIAGNOSIS — F0153 Vascular dementia, unspecified severity, with mood disturbance: Secondary | ICD-10-CM | POA: Diagnosis not present

## 2023-04-13 DIAGNOSIS — I129 Hypertensive chronic kidney disease with stage 1 through stage 4 chronic kidney disease, or unspecified chronic kidney disease: Secondary | ICD-10-CM | POA: Diagnosis not present

## 2023-04-13 DIAGNOSIS — E1122 Type 2 diabetes mellitus with diabetic chronic kidney disease: Secondary | ICD-10-CM | POA: Diagnosis not present

## 2023-04-19 DIAGNOSIS — Z20822 Contact with and (suspected) exposure to covid-19: Secondary | ICD-10-CM | POA: Diagnosis not present

## 2023-04-19 DIAGNOSIS — I77810 Thoracic aortic ectasia: Secondary | ICD-10-CM | POA: Diagnosis not present

## 2023-04-19 DIAGNOSIS — R630 Anorexia: Secondary | ICD-10-CM | POA: Diagnosis not present

## 2023-04-19 DIAGNOSIS — J449 Chronic obstructive pulmonary disease, unspecified: Secondary | ICD-10-CM | POA: Diagnosis not present

## 2023-04-19 DIAGNOSIS — F0390 Unspecified dementia without behavioral disturbance: Secondary | ICD-10-CM | POA: Diagnosis not present

## 2023-04-19 DIAGNOSIS — E860 Dehydration: Secondary | ICD-10-CM | POA: Diagnosis not present

## 2023-04-19 DIAGNOSIS — I10 Essential (primary) hypertension: Secondary | ICD-10-CM | POA: Diagnosis not present

## 2023-04-19 DIAGNOSIS — R531 Weakness: Secondary | ICD-10-CM | POA: Diagnosis not present

## 2023-04-19 DIAGNOSIS — I700 Atherosclerosis of aorta: Secondary | ICD-10-CM | POA: Diagnosis not present

## 2023-04-19 DIAGNOSIS — Z882 Allergy status to sulfonamides status: Secondary | ICD-10-CM | POA: Diagnosis not present

## 2023-04-19 DIAGNOSIS — R079 Chest pain, unspecified: Secondary | ICD-10-CM | POA: Diagnosis not present

## 2023-04-19 DIAGNOSIS — Z1152 Encounter for screening for COVID-19: Secondary | ICD-10-CM | POA: Diagnosis not present

## 2023-04-19 DIAGNOSIS — Z299 Encounter for prophylactic measures, unspecified: Secondary | ICD-10-CM | POA: Diagnosis not present

## 2023-04-19 DIAGNOSIS — F1320 Sedative, hypnotic or anxiolytic dependence, uncomplicated: Secondary | ICD-10-CM | POA: Diagnosis not present

## 2023-04-19 DIAGNOSIS — Z880 Allergy status to penicillin: Secondary | ICD-10-CM | POA: Diagnosis not present

## 2023-04-25 DIAGNOSIS — J440 Chronic obstructive pulmonary disease with acute lower respiratory infection: Secondary | ICD-10-CM | POA: Diagnosis not present

## 2023-04-25 DIAGNOSIS — J209 Acute bronchitis, unspecified: Secondary | ICD-10-CM | POA: Diagnosis not present

## 2023-04-25 DIAGNOSIS — I10 Essential (primary) hypertension: Secondary | ICD-10-CM | POA: Diagnosis not present

## 2023-04-25 DIAGNOSIS — I82402 Acute embolism and thrombosis of unspecified deep veins of left lower extremity: Secondary | ICD-10-CM | POA: Diagnosis not present

## 2023-04-25 DIAGNOSIS — Z299 Encounter for prophylactic measures, unspecified: Secondary | ICD-10-CM | POA: Diagnosis not present

## 2023-04-25 DIAGNOSIS — D692 Other nonthrombocytopenic purpura: Secondary | ICD-10-CM | POA: Diagnosis not present

## 2023-05-02 ENCOUNTER — Other Ambulatory Visit: Payer: Self-pay

## 2023-05-02 ENCOUNTER — Emergency Department (HOSPITAL_COMMUNITY)
Admission: EM | Admit: 2023-05-02 | Discharge: 2023-05-02 | Disposition: A | Discharge status: Home / Self Care | Payer: Medicare PPO | Attending: Emergency Medicine | Admitting: Emergency Medicine

## 2023-05-02 ENCOUNTER — Emergency Department (HOSPITAL_COMMUNITY): Payer: Medicare PPO

## 2023-05-02 DIAGNOSIS — K8689 Other specified diseases of pancreas: Secondary | ICD-10-CM | POA: Insufficient documentation

## 2023-05-02 DIAGNOSIS — I700 Atherosclerosis of aorta: Secondary | ICD-10-CM | POA: Diagnosis not present

## 2023-05-02 DIAGNOSIS — R531 Weakness: Secondary | ICD-10-CM | POA: Diagnosis not present

## 2023-05-02 DIAGNOSIS — R0602 Shortness of breath: Secondary | ICD-10-CM | POA: Diagnosis not present

## 2023-05-02 DIAGNOSIS — K5730 Diverticulosis of large intestine without perforation or abscess without bleeding: Secondary | ICD-10-CM | POA: Diagnosis not present

## 2023-05-02 DIAGNOSIS — I509 Heart failure, unspecified: Secondary | ICD-10-CM | POA: Insufficient documentation

## 2023-05-02 DIAGNOSIS — R9431 Abnormal electrocardiogram [ECG] [EKG]: Secondary | ICD-10-CM | POA: Insufficient documentation

## 2023-05-02 DIAGNOSIS — J439 Emphysema, unspecified: Secondary | ICD-10-CM | POA: Diagnosis not present

## 2023-05-02 DIAGNOSIS — R112 Nausea with vomiting, unspecified: Secondary | ICD-10-CM | POA: Insufficient documentation

## 2023-05-02 DIAGNOSIS — R197 Diarrhea, unspecified: Secondary | ICD-10-CM | POA: Diagnosis not present

## 2023-05-02 DIAGNOSIS — Z86718 Personal history of other venous thrombosis and embolism: Secondary | ICD-10-CM | POA: Insufficient documentation

## 2023-05-02 DIAGNOSIS — J449 Chronic obstructive pulmonary disease, unspecified: Secondary | ICD-10-CM | POA: Insufficient documentation

## 2023-05-02 DIAGNOSIS — R000 Tachycardia, unspecified: Secondary | ICD-10-CM | POA: Diagnosis not present

## 2023-05-02 DIAGNOSIS — I2510 Atherosclerotic heart disease of native coronary artery without angina pectoris: Secondary | ICD-10-CM | POA: Insufficient documentation

## 2023-05-02 DIAGNOSIS — K529 Noninfective gastroenteritis and colitis, unspecified: Secondary | ICD-10-CM | POA: Diagnosis not present

## 2023-05-02 DIAGNOSIS — R918 Other nonspecific abnormal finding of lung field: Secondary | ICD-10-CM | POA: Diagnosis not present

## 2023-05-02 DIAGNOSIS — Z20822 Contact with and (suspected) exposure to covid-19: Secondary | ICD-10-CM | POA: Insufficient documentation

## 2023-05-02 DIAGNOSIS — I272 Pulmonary hypertension, unspecified: Secondary | ICD-10-CM | POA: Diagnosis not present

## 2023-05-02 DIAGNOSIS — E876 Hypokalemia: Secondary | ICD-10-CM | POA: Diagnosis not present

## 2023-05-02 DIAGNOSIS — Z7901 Long term (current) use of anticoagulants: Secondary | ICD-10-CM | POA: Insufficient documentation

## 2023-05-02 DIAGNOSIS — I499 Cardiac arrhythmia, unspecified: Secondary | ICD-10-CM | POA: Diagnosis not present

## 2023-05-02 LAB — COMPREHENSIVE METABOLIC PANEL
ALT: 20 U/L (ref 0–44)
AST: 39 U/L (ref 15–41)
Albumin: 3 g/dL — ABNORMAL LOW (ref 3.5–5.0)
Alkaline Phosphatase: 128 U/L — ABNORMAL HIGH (ref 38–126)
Anion gap: 9 (ref 5–15)
BUN: 14 mg/dL (ref 8–23)
CO2: 20 mmol/L — ABNORMAL LOW (ref 22–32)
Calcium: 8.8 mg/dL — ABNORMAL LOW (ref 8.9–10.3)
Chloride: 113 mmol/L — ABNORMAL HIGH (ref 98–111)
Creatinine, Ser: 1.14 mg/dL (ref 0.61–1.24)
GFR, Estimated: >60 mL/min (ref >60)
Glucose, Bld: 141 mg/dL — ABNORMAL HIGH (ref 70–99)
Potassium: 3.3 mmol/L — ABNORMAL LOW (ref 3.5–5.1)
Sodium: 142 mmol/L (ref 135–145)
Total Bilirubin: 0.7 mg/dL (ref 0.0–1.2)
Total Protein: 6.8 g/dL (ref 6.5–8.1)

## 2023-05-02 LAB — CBC WITH DIFFERENTIAL/PLATELET
Abs Immature Granulocytes: 0.02 10*3/uL (ref 0.00–0.07)
Basophils Absolute: 0 10*3/uL (ref 0.0–0.1)
Basophils Relative: 1 %
Eosinophils Absolute: 0.8 10*3/uL — ABNORMAL HIGH (ref 0.0–0.5)
Eosinophils Relative: 11 %
HCT: 47.9 % (ref 39.0–52.0)
Hemoglobin: 15.7 g/dL (ref 13.0–17.0)
Immature Granulocytes: 0 %
Lymphocytes Relative: 36 %
Lymphs Abs: 2.7 10*3/uL (ref 0.7–4.0)
MCH: 29.8 pg (ref 26.0–34.0)
MCHC: 32.8 g/dL (ref 30.0–36.0)
MCV: 91.1 fL (ref 80.0–100.0)
Monocytes Absolute: 1 10*3/uL (ref 0.1–1.0)
Monocytes Relative: 13 %
Neutro Abs: 2.8 10*3/uL (ref 1.7–7.7)
Neutrophils Relative %: 39 %
Platelets: 494 10*3/uL — ABNORMAL HIGH (ref 150–400)
RBC: 5.26 MIL/uL (ref 4.22–5.81)
RDW: 14.9 % (ref 11.5–15.5)
WBC: 7.4 10*3/uL (ref 4.0–10.5)
nRBC: 0 % (ref 0.0–0.2)

## 2023-05-02 LAB — RESP PANEL BY RT-PCR (RSV, FLU A&B, COVID)  RVPGX2
Influenza A by PCR: NEGATIVE
Influenza B by PCR: NEGATIVE
Resp Syncytial Virus by PCR: NEGATIVE
SARS Coronavirus 2 by RT PCR: NEGATIVE

## 2023-05-02 LAB — TROPONIN I (HIGH SENSITIVITY)
Troponin I (High Sensitivity): 12 ng/L (ref <18)
Troponin I (High Sensitivity): 10 ng/L (ref <18)

## 2023-05-02 LAB — BRAIN NATRIURETIC PEPTIDE: B Natriuretic Peptide: 222 pg/mL — ABNORMAL HIGH (ref 0.0–100.0)

## 2023-05-02 MED ORDER — POTASSIUM CHLORIDE CRYS ER 20 MEQ PO TBCR
40.0000 meq | EXTENDED_RELEASE_TABLET | Freq: Once | ORAL | Status: DC
  Filled 2023-05-02: qty 2

## 2023-05-02 MED ORDER — AMOXICILLIN-POT CLAVULANATE 875-125 MG PO TABS: 1.0000 | ORAL_TABLET | Freq: Two times a day (BID) | ORAL | 0 refills | Status: DC

## 2023-05-02 MED ORDER — HYDROCODONE-ACETAMINOPHEN 5-325 MG PO TABS
1.0000 | ORAL_TABLET | Freq: Once | ORAL | Status: AC
  Administered 2023-05-02: 1 via ORAL
  Filled 2023-05-02: qty 1

## 2023-05-02 MED ORDER — AMOXICILLIN-POT CLAVULANATE 875-125 MG PO TABS: 1.0000 | ORAL_TABLET | Freq: Once | ORAL | Status: DC

## 2023-05-02 MED ORDER — IOHEXOL 350 MG/ML SOLN
100.0000 mL | Freq: Once | INTRAVENOUS | Status: AC | PRN
  Administered 2023-05-02: 100 mL via INTRAVENOUS

## 2023-05-02 MED ORDER — LACTATED RINGERS IV BOLUS
500.0000 mL | Freq: Once | INTRAVENOUS | Status: AC
  Administered 2023-05-02: 500 mL via INTRAVENOUS

## 2023-05-02 NOTE — ED Triage Notes (Signed)
 Ptarrived from home via POV c/o on-going N/V/D. Pt reports he contacted his PCP and was advised to come to [facility] for evaluation of an irregular heartbeat.

## 2023-05-02 NOTE — Discharge Instructions (Addendum)
 Take care of you today.  You were seen in the department for diarrhea that has been ongoing for the past month, your CT does show colitis and you are being prescribed antibiotics.   Since you have been having some shortness of breath and have a blood clot in your leg as well, we did a CT scan of your chest which did not show any blood clot lungs but did show evidence of possible pulmonary hypertension.  It also showed some hardening of your arteries.  You need to follow-up with your cardiologist for further evaluation.  Come back if you have worsening shortness of breath, or other new or worsening symptoms.  CT scan of your abdomen pelvis also showed a seroma at your incision site, follow-up with your back surgeon, there is no sign of infection in this area but if you develop redness, increased pain, drainage or fevers come back to the ER.

## 2023-05-02 NOTE — ED Provider Notes (Signed)
[place] EMERGENCY DEPARTMENT AT [facility] Provider Note   CSN: [id number] Arrival date & time: 05/02/23  1004     History  Chief Complaint  Patient presents with   Diarrhea    Derrick Bender is a 78 y.o. male.  He has PMH of CHF, DVT, he is on Xarelto, has been having diarrhea for about a month, able to tolerate fluids but every time he has anything to eat or drink immediately has a bowel movement.  He has come compliant with his Xarelto, daughter reports's for about 17 days now.  He is able to tolerate fluids at home without vomiting.  This is the setting of being postop from back surgery.    Diarrhea      Home Medications Prior to Admission medications   Medication Sig Start Date End Date Taking? Authorizing Provider  albuterol (PROAIR HFA) 108 (90 Base) MCG/ACT inhaler Inhale 2 puffs into the lungs every 6 (six) hours as needed.    [provider]  ALPRAZolam Prudy Feeler) 0.25 MG tablet Take 0.25 mg by mouth 2 (two) times daily as needed. 08/16/21   [provider]  atorvastatin (LIPITOR) 40 MG tablet TAKE 1 TABLET BY MOUTH EVERY DAY **STOP SIMVASTATIN 12/11/22   Antoine Poche, MD  budesonide-formoterol [facility]) 160-4.5 MCG/ACT inhaler Inhale 2 puffs into the lungs daily.    [provider]  clopidogrel (PLAVIX) 75 MG tablet Take 75 mg by mouth daily.    [provider]  diazepam (VALIUM) 5 MG tablet Take 5 mg by mouth 2 (two) times daily as needed.    [provider]  diltiazem (TIAZAC) 240 MG 24 hr capsule Take 1 capsule (240 mg total) by mouth daily. 12/22/18   Leroy Sea, MD  donepezil (ARICEPT) 5 MG tablet Take 5 mg by mouth daily. 08/31/21   [provider]  escitalopram (LEXAPRO) 20 MG tablet Take 20 mg by mouth at bedtime. 08/16/18   [provider]  furosemide (LASIX) 20 MG tablet TAKE 1 TABLET BY MOUTH EVERY DAY 01/03/23   Antoine Poche, MD  isosorbide mononitrate (IMDUR) 30 MG 24  hr tablet Take 1 tablet (30 mg total) by mouth daily. 12/23/18   Leroy Sea, MD  lisinopril (ZESTRIL) 20 MG tablet Take by mouth. 08/24/21   [provider]  loperamide (IMODIUM A-D) 2 MG tablet Take 1 tablet (2 mg total) by mouth 4 (four) times daily as needed for diarrhea or loose stools. 12/22/18   Leroy Sea, MD  Melatonin 10 MG TABS Take 10 mg by mouth at bedtime.    [provider]  mirtazapine (REMERON) 7.5 MG tablet Take 7.5 mg by mouth at bedtime. 08/30/21   [provider]  oxybutynin (DITROPAN) 5 MG tablet Take 1 tablet by mouth 3 (three) times daily. 01/28/15   [provider]  pantoprazole (PROTONIX) 40 MG tablet Take 40 mg by mouth daily. 08/24/21   [provider]  phenytoin (DILANTIN) 100 MG ER capsule Take 100 mg by mouth 3 (three) times daily.    [provider]  predniSONE (DELTASONE) 10 MG tablet Take 1 tablet (10 mg total) by mouth daily. Once you finish the prescribed prednisone taper given at the hospital you can continue this medication as before if you were supposed to be on it chronically.  Check with your primary care physician. 12/22/18   Leroy Sea, MD  Testosterone 12.5 MG/ACT (1%) GEL Apply 1.25 g topically daily.  09/02/15   [provider]  topiramate (TOPAMAX) 100 MG tablet Take 100 mg by mouth 2 (two) times daily.    [provider]      Allergies    Sulfamethoxazole    Review of Systems   Review of Systems  Gastrointestinal:  Positive for diarrhea.    Physical Exam Updated Vital Signs BP 125/80   Pulse (!) 109   Temp 98 F (36.7 C) (Oral)   Resp 16   Ht 5\' 9"  (1.753 m)   Wt 99 kg   SpO2 100%   BMI 32.23 kg/m  Physical Exam Vitals and nursing note reviewed.  Constitutional:      General: He is not in acute distress.    Appearance: He is well-developed.  HENT:     Head: Normocephalic and atraumatic.  Eyes:     Conjunctiva/sclera: Conjunctivae normal.   Cardiovascular:     Rate and Rhythm: Normal rate and regular rhythm.     Heart sounds: No murmur heard. Pulmonary:     Effort: Pulmonary effort is normal. No respiratory distress.     Breath sounds: Normal breath sounds.  Abdominal:     Palpations: Abdomen is soft.     Tenderness: There is no abdominal tenderness.  Musculoskeletal:        General: No swelling.     Cervical back: Neck supple.  Skin:    General: Skin is warm and dry.     Capillary Refill: Capillary refill takes less than 2 seconds.  Neurological:     General: No focal deficit present.     Mental Status: He is alert and oriented to person, place, and time.  Psychiatric:        Mood and Affect: Mood normal.     ED Results / Procedures / Treatments   Labs (all labs ordered are listed, but only abnormal results are displayed) Labs Reviewed  CBC WITH DIFFERENTIAL/PLATELET - Abnormal; Notable for the following components:      Result Value   Platelets 494 (*)    Eosinophils Absolute 0.8 (*)    All other components within normal limits  RESP PANEL BY RT-PCR (RSV, FLU A&B, COVID)  [id number]  COMPREHENSIVE METABOLIC PANEL  BRAIN NATRIURETIC PEPTIDE  TROPONIN I (HIGH SENSITIVITY)    EKG None  Radiology No results found.  Procedures Procedures    Medications Ordered in ED Medications  lactated ringers bolus 500 mL (has no administration in time range)    ED Course/ Medical Decision Making/ A&P Clinical Course as of 05/02/23 1530  Wed May 02, 2023  1510 Patient presents the ER today has been had ongoing diarrhea with some nausea and occasional vomiting for about a month, she is able to make to take something by mouth he immediately has a watery bowel movement.  No blood in the stool.  Notes that he was having palpitations so PCP advised evaluation in the ED.  He also has had some generalized weakness, increased shortness of breath, he is on blood thinners for DVT and states he been compliant but given the  palpitations and shortness of breath PE study ordered which is negative today, CT abdomen pelvis shows long segment of mild colonic wall thickness of the transverse colon suggestive of colitis.  Also noted likely seroma postop in the lumbar area after his decompression surgery [CB]    Clinical Course User Index [CB] Carmel Sacramento A, PA-C  Medical Decision Making This patient presents to the ED for concern of diarrhea x 1 month, this involves an extensive number of treatment options, and is a complaint that carries with it a high risk of complications and morbidity.  The differential diagnosis includes but is, diverticulitis, infectious diarrhea, dehydration, other   Co morbidities that complicate the patient evaluation :   DVT Xarelto, COPD, seizures   Additional history obtained:  Additional history obtained from EMR External records from outside source obtained and reviewed including ED visit from [facility] on January 2 including labs from that visit, prior MRCP results from last year, prior notes and labs   Lab Tests:  I Ordered, and personally interpreted labs.  The pertinent results include: Patient is at baseline renal function, mild hypokalemia, mildly decreased CO2 from this diarrhea, no leukocytosis, no anemia   Imaging Studies ordered:  I ordered imaging studies including CTA chest which shows no PE, does show groundglass opacity in the right lung and enlargement of the pulmonary trunk suggesting pulmonary arterial hypertension; CT abdomen pelvis shows wall thickening of the transverse colon, Eckley seroma of surgical site I independently visualized and interpreted imaging within scope of identifying emergent findings  I agree with the radiologist interpretation   Cardiac Monitoring: / EKG:  The patient was maintained on a cardiac monitor.  I personally viewed and interpreted the cardiac monitored which showed an underlying rhythm of:  Sinus rhythm     Problem List / ED Course / Critical interventions / Medication management  Patient primarily here because he has been having diarrhea ongoing for the past month.  Denies recent antibiotics, no blood in the stool.  Denies overtly foul-smelling stool, denies abdominal pain.  He is tolerating fluids, but worried about continued diarrhea.  No bowel movements in the ER here even with drinking water with pain meds, she requested for his chronic pain.  CT shows likely colitis which would fit with his symptoms we will treat with Augmentin especially avoiding quinolones states borderline QT prolongation on EKG.  Discussed we will need to follow-up with GI patient and may need stool studies if not improving.  Ordered admission if he felt like he was weak and was not going to be able to hydrate appropriate at home but he would prefer to go home.  His mild tachycardia on arrival likely due to some slight volume depletion, he is given small fluid bolus here and heart rate now 86.  He has been afebrile.  Discharge vitals were recorded and patient was discharged without we be notified, did show temp of 96.6 though he has not been hypothermic on multiple other temperature checks in the ED likely an error at the typographical or in method of taking temperature.  He does not have any other systemic symptoms.  He has outpatient GI follow-up in [place] with Dr. Elodia Florence.  CT did show some biliary dilatation but has already been followed up with MRI MRCP in 2024 and he is going to follow-up with his GI doctor.  Family feels comfortable with him going home, no change in his functional status since he has come home from the nursing home they report. He is actively going to physical therapy.  Discussed incidental findings on the CT of his chest including the pulmonary hypertension, advised follow-up cardiology with this, did show possible infiltrate versus asymmetric edema.  He is not having cough or fever and  does not have leukocytosis, although he is being treated with Augmentin which will cover  for pneumonia if this was the case so do not feel any further workup is needed for this, likely some mild asymmetric edema as he does have known CHF which is why he was given judicious fluids  I have reviewed the patients home medicines and have made adjustments as needed    Amount and/or Complexity of Data Reviewed Labs: ordered. Radiology: ordered.  Risk Prescription drug management.           Final Clinical Impression(s) / ED Diagnoses Final diagnoses:  None    Rx / DC Orders ED Discharge Orders     None         Ma Rings, PA-C 05/02/23 1652    Vanetta Mulders, MD 05/05/23 1524

## 2023-05-09 DIAGNOSIS — D649 Anemia, unspecified: Secondary | ICD-10-CM | POA: Diagnosis not present

## 2023-05-09 DIAGNOSIS — R935 Abnormal findings on diagnostic imaging of other abdominal regions, including retroperitoneum: Secondary | ICD-10-CM | POA: Diagnosis not present

## 2023-05-09 DIAGNOSIS — R9389 Abnormal findings on diagnostic imaging of other specified body structures: Secondary | ICD-10-CM | POA: Diagnosis not present

## 2023-05-09 DIAGNOSIS — E876 Hypokalemia: Secondary | ICD-10-CM | POA: Diagnosis not present

## 2023-05-10 DIAGNOSIS — I272 Pulmonary hypertension, unspecified: Secondary | ICD-10-CM | POA: Diagnosis not present

## 2023-05-10 DIAGNOSIS — I10 Essential (primary) hypertension: Secondary | ICD-10-CM | POA: Diagnosis not present

## 2023-05-10 DIAGNOSIS — E46 Unspecified protein-calorie malnutrition: Secondary | ICD-10-CM | POA: Diagnosis not present

## 2023-05-10 DIAGNOSIS — E1159 Type 2 diabetes mellitus with other circulatory complications: Secondary | ICD-10-CM | POA: Diagnosis not present

## 2023-05-10 DIAGNOSIS — Z299 Encounter for prophylactic measures, unspecified: Secondary | ICD-10-CM | POA: Diagnosis not present

## 2023-05-10 DIAGNOSIS — E1165 Type 2 diabetes mellitus with hyperglycemia: Secondary | ICD-10-CM | POA: Diagnosis not present

## 2023-05-14 DIAGNOSIS — D649 Anemia, unspecified: Secondary | ICD-10-CM | POA: Diagnosis not present

## 2023-05-14 DIAGNOSIS — E876 Hypokalemia: Secondary | ICD-10-CM | POA: Diagnosis not present

## 2023-05-24 DIAGNOSIS — I700 Atherosclerosis of aorta: Secondary | ICD-10-CM | POA: Diagnosis not present

## 2023-05-24 DIAGNOSIS — Z299 Encounter for prophylactic measures, unspecified: Secondary | ICD-10-CM | POA: Diagnosis not present

## 2023-05-24 DIAGNOSIS — J069 Acute upper respiratory infection, unspecified: Secondary | ICD-10-CM | POA: Diagnosis not present

## 2023-05-24 DIAGNOSIS — R569 Unspecified convulsions: Secondary | ICD-10-CM | POA: Diagnosis not present

## 2023-05-24 DIAGNOSIS — I10 Essential (primary) hypertension: Secondary | ICD-10-CM | POA: Diagnosis not present

## 2023-06-04 DIAGNOSIS — J449 Chronic obstructive pulmonary disease, unspecified: Secondary | ICD-10-CM | POA: Diagnosis not present

## 2023-06-04 DIAGNOSIS — F339 Major depressive disorder, recurrent, unspecified: Secondary | ICD-10-CM | POA: Diagnosis not present

## 2023-06-04 DIAGNOSIS — Z299 Encounter for prophylactic measures, unspecified: Secondary | ICD-10-CM | POA: Diagnosis not present

## 2023-06-04 DIAGNOSIS — I10 Essential (primary) hypertension: Secondary | ICD-10-CM | POA: Diagnosis not present

## 2023-06-04 DIAGNOSIS — I152 Hypertension secondary to endocrine disorders: Secondary | ICD-10-CM | POA: Diagnosis not present

## 2023-06-04 DIAGNOSIS — I700 Atherosclerosis of aorta: Secondary | ICD-10-CM | POA: Diagnosis not present

## 2023-06-04 DIAGNOSIS — E1165 Type 2 diabetes mellitus with hyperglycemia: Secondary | ICD-10-CM | POA: Diagnosis not present

## 2023-06-04 DIAGNOSIS — E1159 Type 2 diabetes mellitus with other circulatory complications: Secondary | ICD-10-CM | POA: Diagnosis not present

## 2023-06-07 DIAGNOSIS — I129 Hypertensive chronic kidney disease with stage 1 through stage 4 chronic kidney disease, or unspecified chronic kidney disease: Secondary | ICD-10-CM | POA: Diagnosis not present

## 2023-06-07 DIAGNOSIS — E1122 Type 2 diabetes mellitus with diabetic chronic kidney disease: Secondary | ICD-10-CM | POA: Diagnosis not present

## 2023-06-07 DIAGNOSIS — F339 Major depressive disorder, recurrent, unspecified: Secondary | ICD-10-CM | POA: Diagnosis not present

## 2023-06-07 DIAGNOSIS — N182 Chronic kidney disease, stage 2 (mild): Secondary | ICD-10-CM | POA: Diagnosis not present

## 2023-06-12 DIAGNOSIS — Z9889 Other specified postprocedural states: Secondary | ICD-10-CM | POA: Diagnosis not present

## 2023-06-27 DIAGNOSIS — R350 Frequency of micturition: Secondary | ICD-10-CM | POA: Diagnosis not present

## 2023-06-27 DIAGNOSIS — R32 Unspecified urinary incontinence: Secondary | ICD-10-CM | POA: Diagnosis not present

## 2023-06-27 DIAGNOSIS — N35919 Unspecified urethral stricture, male, unspecified site: Secondary | ICD-10-CM | POA: Diagnosis not present

## 2023-06-27 DIAGNOSIS — R338 Other retention of urine: Secondary | ICD-10-CM | POA: Diagnosis not present

## 2023-06-27 DIAGNOSIS — N390 Urinary tract infection, site not specified: Secondary | ICD-10-CM | POA: Diagnosis not present

## 2023-06-27 DIAGNOSIS — C61 Malignant neoplasm of prostate: Secondary | ICD-10-CM | POA: Diagnosis not present

## 2023-07-02 ENCOUNTER — Emergency Department (HOSPITAL_COMMUNITY)

## 2023-07-02 ENCOUNTER — Emergency Department (HOSPITAL_COMMUNITY)
Admission: EM | Admit: 2023-07-02 | Discharge: 2023-07-02 | Disposition: A | Discharge status: Home / Self Care | Attending: Emergency Medicine | Admitting: Emergency Medicine

## 2023-07-02 ENCOUNTER — Other Ambulatory Visit: Payer: Self-pay

## 2023-07-02 ENCOUNTER — Encounter (HOSPITAL_COMMUNITY): Payer: Self-pay

## 2023-07-02 DIAGNOSIS — N200 Calculus of kidney: Secondary | ICD-10-CM | POA: Diagnosis not present

## 2023-07-02 DIAGNOSIS — R350 Frequency of micturition: Secondary | ICD-10-CM | POA: Diagnosis not present

## 2023-07-02 DIAGNOSIS — E119 Type 2 diabetes mellitus without complications: Secondary | ICD-10-CM | POA: Insufficient documentation

## 2023-07-02 DIAGNOSIS — R109 Unspecified abdominal pain: Secondary | ICD-10-CM | POA: Insufficient documentation

## 2023-07-02 DIAGNOSIS — Z7951 Long term (current) use of inhaled steroids: Secondary | ICD-10-CM | POA: Diagnosis not present

## 2023-07-02 DIAGNOSIS — Z8546 Personal history of malignant neoplasm of prostate: Secondary | ICD-10-CM | POA: Diagnosis not present

## 2023-07-02 DIAGNOSIS — N281 Cyst of kidney, acquired: Secondary | ICD-10-CM | POA: Diagnosis not present

## 2023-07-02 DIAGNOSIS — K5730 Diverticulosis of large intestine without perforation or abscess without bleeding: Secondary | ICD-10-CM | POA: Diagnosis not present

## 2023-07-02 DIAGNOSIS — J449 Chronic obstructive pulmonary disease, unspecified: Secondary | ICD-10-CM | POA: Diagnosis not present

## 2023-07-02 LAB — CBC
HCT: 36.4 % — ABNORMAL LOW (ref 39.0–52.0)
Hemoglobin: 10.6 g/dL — ABNORMAL LOW (ref 13.0–17.0)
MCH: 26.3 pg (ref 26.0–34.0)
MCHC: 29.1 g/dL — ABNORMAL LOW (ref 30.0–36.0)
MCV: 90.3 fL (ref 80.0–100.0)
Platelets: 438 10*3/uL — ABNORMAL HIGH (ref 150–400)
RBC: 4.03 MIL/uL — ABNORMAL LOW (ref 4.22–5.81)
RDW: 14.4 % (ref 11.5–15.5)
WBC: 11.5 10*3/uL — ABNORMAL HIGH (ref 4.0–10.5)
nRBC: 0.5 % — ABNORMAL HIGH (ref 0.0–0.2)

## 2023-07-02 LAB — BASIC METABOLIC PANEL
Anion gap: 12 (ref 5–15)
BUN: 27 mg/dL — ABNORMAL HIGH (ref 8–23)
CO2: 23 mmol/L (ref 22–32)
Calcium: 8.7 mg/dL — ABNORMAL LOW (ref 8.9–10.3)
Chloride: 108 mmol/L (ref 98–111)
Creatinine, Ser: 1.19 mg/dL (ref 0.61–1.24)
GFR, Estimated: >60 mL/min (ref >60)
Glucose, Bld: 79 mg/dL (ref 70–99)
Potassium: 3.4 mmol/L — ABNORMAL LOW (ref 3.5–5.1)
Sodium: 143 mmol/L (ref 135–145)

## 2023-07-02 LAB — URINALYSIS, ROUTINE W REFLEX MICROSCOPIC
Bacteria, UA: NONE SEEN
Glucose, UA: NEGATIVE mg/dL
Hgb urine dipstick: NEGATIVE
Ketones, ur: NEGATIVE mg/dL
Leukocytes,Ua: NEGATIVE
Nitrite: NEGATIVE
Protein, ur: 100 mg/dL — AB
Specific Gravity, Urine: 1.034 — ABNORMAL HIGH (ref 1.005–1.030)
pH: 5 (ref 5.0–8.0)

## 2023-07-02 MED ORDER — IBUPROFEN 800 MG PO TABS
800.0000 mg | ORAL_TABLET | Freq: Once | ORAL | Status: AC
  Administered 2023-07-02: 800 mg via ORAL
  Filled 2023-07-02: qty 1

## 2023-07-02 NOTE — Discharge Instructions (Signed)
 You are seen in the emergency department today for concerns of left-sided flank pain.  Your labs and imaging were largely reassuring with a notable 2 mm stone inside the left kidney observed.  This is likely not causing significant pain although may cause some occasional discomfort.  Your urine does not show any signs of infection at this time.  [facility] advised that you take ibuprofen for pain as needed and follow-up with your primary care provider.  Return to the emergency department for concerns of new or worsening symptoms such as fever, blood in urine, or inability to urinate.

## 2023-07-02 NOTE — ED Provider Notes (Signed)
 [place] EMERGENCY DEPARTMENT AT [facility] Provider Note   CSN: [id number] Arrival date & time: 07/02/23  1833     History Chief Complaint  Patient presents with   Flank Pain    Derrick Bender is a 78 y.o. male.  Patient with past significant for COPD, seizures, type 2 diabetes, interstitial lung disease, and prostate cancer presents to the emergency department today with concerns of flank pain.  Patient reportedly was diagnosed with a UTI recently was started on nitrofurantoin.  He has been on this medication for about 4 to 5 days without significant improvement.  Endorses some increased urinary frequency with incomplete voiding.  Denies any obvious hematuria and no significant dysuria.  Has progressed to now experiencing left flank tenderness.  Denies any abdominal pain.  No nausea, vomiting, or diarrhea.  No recent fever, chills, or bodyaches.   Flank Pain       Home Medications Prior to Admission medications   Medication Sig Start Date End Date Taking? Authorizing Provider  albuterol (PROAIR HFA) 108 (90 Base) MCG/ACT inhaler Inhale 2 puffs into the lungs every 6 (six) hours as needed.    [provider]  ALPRAZolam Prudy Feeler) 0.25 MG tablet Take 0.25 mg by mouth 2 (two) times daily as needed. 08/16/21   [provider]  amoxicillin-clavulanate (AUGMENTIN) 875-125 MG tablet Take 1 tablet by mouth every 12 (twelve) hours. 05/02/23   Beatty, Celeste A, PA-C  atorvastatin (LIPITOR) 40 MG tablet TAKE 1 TABLET BY MOUTH EVERY DAY **STOP SIMVASTATIN 12/11/22   Antoine Poche, MD  budesonide-formoterol [facility]) 160-4.5 MCG/ACT inhaler Inhale 2 puffs into the lungs daily.    [provider]  clopidogrel (PLAVIX) 75 MG tablet Take 75 mg by mouth daily.    [provider]  diazepam (VALIUM) 5 MG tablet Take 5 mg by mouth 2 (two) times daily as needed.    [provider]  diltiazem (TIAZAC) 240 MG 24 hr capsule Take 1 capsule (240  mg total) by mouth daily. 12/22/18   Leroy Sea, MD  donepezil (ARICEPT) 5 MG tablet Take 5 mg by mouth daily. 08/31/21   [provider]  escitalopram (LEXAPRO) 20 MG tablet Take 20 mg by mouth at bedtime. 08/16/18   [provider]  furosemide (LASIX) 20 MG tablet TAKE 1 TABLET BY MOUTH EVERY DAY 01/03/23   Antoine Poche, MD  isosorbide mononitrate (IMDUR) 30 MG 24 hr tablet Take 1 tablet (30 mg total) by mouth daily. 12/23/18   Leroy Sea, MD  lisinopril (ZESTRIL) 20 MG tablet Take by mouth. 08/24/21   [provider]  loperamide (IMODIUM A-D) 2 MG tablet Take 1 tablet (2 mg total) by mouth 4 (four) times daily as needed for diarrhea or loose stools. 12/22/18   Leroy Sea, MD  Melatonin 10 MG TABS Take 10 mg by mouth at bedtime.    [provider]  mirtazapine (REMERON) 7.5 MG tablet Take 7.5 mg by mouth at bedtime. 08/30/21   [provider]  oxybutynin (DITROPAN) 5 MG tablet Take 1 tablet by mouth 3 (three) times daily. 01/28/15   [provider]  pantoprazole (PROTONIX) 40 MG tablet Take 40 mg by mouth daily. 08/24/21   [provider]  phenytoin (DILANTIN) 100 MG ER capsule Take 100 mg by mouth 3 (three) times daily.    [provider]  predniSONE (DELTASONE) 10 MG tablet Take 1 tablet (10 mg total) by mouth daily. Once you  finish the prescribed prednisone taper given at the hospital you can continue this medication as before if you were supposed to be on it chronically.  Check with your primary care physician. 12/22/18   Leroy Sea, MD  Testosterone 12.5 MG/ACT (1%) GEL Apply 1.25 g topically daily. 09/02/15   [provider]  topiramate (TOPAMAX) 100 MG tablet Take 100 mg by mouth 2 (two) times daily.    [provider]      Allergies    Sulfamethoxazole    Review of Systems   Review of Systems  Genitourinary:  Positive for flank pain.  All other systems reviewed and are  negative.   Physical Exam Updated Vital Signs BP (!) 163/76 (BP Location: Right Arm)   Pulse 77   Temp 98.4 F (36.9 C) (Oral)   Resp 15   Ht 5\' 9"  (1.753 m)   SpO2 97%   BMI 32.23 kg/m  Physical Exam Vitals and nursing note reviewed.  Constitutional:      General: He is not in acute distress.    Appearance: He is well-developed.  HENT:     Head: Normocephalic and atraumatic.  Eyes:     Conjunctiva/sclera: Conjunctivae normal.  Cardiovascular:     Rate and Rhythm: Normal rate and regular rhythm.     Heart sounds: No murmur heard. Pulmonary:     Effort: Pulmonary effort is normal. No respiratory distress.     Breath sounds: Normal breath sounds.  Abdominal:     General: Abdomen is flat. Bowel sounds are normal. There is no distension.     Palpations: Abdomen is soft.     Tenderness: There is no abdominal tenderness. There is left CVA tenderness. There is no right CVA tenderness or guarding.     Comments: Left CVA tenderness, no abdominal tenderness.  Musculoskeletal:        General: No swelling.     Cervical back: Neck supple.  Skin:    General: Skin is warm and dry.     Capillary Refill: Capillary refill takes less than 2 seconds.  Neurological:     Mental Status: He is alert.  Psychiatric:        Mood and Affect: Mood normal.     ED Results / Procedures / Treatments   Labs (all labs ordered are listed, but only abnormal results are displayed) Labs Reviewed  URINALYSIS, ROUTINE W REFLEX MICROSCOPIC - Abnormal; Notable for the following components:      Result Value   Color, Urine AMBER (*)    APPearance HAZY (*)    Specific Gravity, Urine 1.034 (*)    Bilirubin Urine SMALL (*)    Protein, ur 100 (*)    All other components within normal limits  BASIC METABOLIC PANEL - Abnormal; Notable for the following components:   Potassium 3.4 (*)    BUN 27 (*)    Calcium 8.7 (*)    All other components within normal limits  CBC - Abnormal; Notable for the following  components:   WBC 11.5 (*)    RBC 4.03 (*)    Hemoglobin 10.6 (*)    HCT 36.4 (*)    MCHC 29.1 (*)    Platelets 438 (*)    nRBC 0.5 (*)    All other components within normal limits  URINE CULTURE    EKG None  Radiology CT Renal Stone Study Result Date: 07/02/2023 CLINICAL DATA:  Left flank pain, urinary tract infection EXAM: CT ABDOMEN AND PELVIS WITHOUT  CONTRAST TECHNIQUE: Multidetector CT imaging of the abdomen and pelvis was performed following the standard protocol without IV contrast. RADIATION DOSE REDUCTION: This exam was performed according to the departmental dose-optimization program which includes automated exposure control, adjustment of the mA and/or kV according to patient size and/or use of iterative reconstruction technique. COMPARISON:  05/02/2023 FINDINGS: Lower chest: No acute pleural or parenchymal lung disease. Hepatobiliary: Unremarkable unenhanced appearance of the liver and gallbladder. Pancreas: Unremarkable unenhanced appearance. Spleen: Unremarkable unenhanced appearance. Adrenals/Urinary Tract: 2 mm nonobstructing left renal calculus. No right-sided calculi. No obstructive uropathy within either kidney. Simple right renal cortical cysts do not require specific imaging follow-up. The adrenals are unremarkable. The bladder is decompressed, limiting its evaluation. Stomach/Bowel: No bowel obstruction or ileus. Normal appendix right lower quadrant. Diverticulosis of the descending and sigmoid colon without evidence of acute diverticulitis. No bowel wall thickening or inflammatory change. Vascular/Lymphatic: Aortic atherosclerosis. No enlarged abdominal or pelvic lymph nodes. Reproductive: Prior prostatectomy. No abnormalities within the prostate bed. Incidental note is made of a right hydrocele. Other: No free fluid or free intraperitoneal gas. No abdominal wall hernia. Musculoskeletal: No acute or destructive bony abnormalities. Multilevel lumbar spondylosis. Prior  laminectomies from L2-3 through L5-S1. Reconstructed images demonstrate no additional findings. IMPRESSION: 1. 2 mm nonobstructing left renal calculus. 2. Colonic diverticulosis without diverticulitis. 3. Incidental right hydrocele. 4.  Aortic Atherosclerosis (ICD10-I70.0). Electronically Signed   By: Sharlet Salina M.D.   On: 07/02/2023 21:28    Procedures Procedures    Medications Ordered in ED Medications  ibuprofen (ADVIL) tablet 800 mg (800 mg Oral Given 07/02/23 2050)    ED Course/ Medical Decision Making/ A&P                                 Medical Decision Making Amount and/or Complexity of Data Reviewed Labs: ordered. Radiology: ordered.  Risk Prescription drug management.   This patient presents to the ED for concern of flank pain.  Differential diagnosis includes pyelonephritis, urolithiasis, UTI, inguinal hernia   Lab Tests:  I Ordered, and personally interpreted labs.  The pertinent results include: CBC with a leukocytosis at 11.5, BMP unremarkable, UA without signs of infection we will send off for culture   Imaging Studies ordered:  I ordered imaging studies including CT renal stone study I independently visualized and interpreted imaging which showed 1. 2 mm nonobstructing left renal calculus. 2. Colonic diverticulosis without diverticulitis. 3. Incidental right hydrocele. I agree with the radiologist interpretation   Medicines ordered and prescription drug management:  I ordered medication including ibuprofen for pain Reevaluation of the patient after these medicines showed that the patient improved I have reviewed the patients home medicines and have made adjustments as needed   Problem List / ED Course:  Patient with past history significant for COPD, seizures, type 2 diabetes, residual lung disease, prostate cancer presents to ED with concerns of left-sided flank pain.  Reports that he was recently diagnosed with a UTI and started on nitrofurantoin.   States he has been taking the medication as prescribed without significant improvement in symptoms.  Endorsing some pain in the left flank.  No recent fever, chills, bodyaches, or hematuria.  Does endorse some increased urinary frequency.  Denies any significant improvement with antibiotics. Physical exam does reveal left-sided flank tenderness.  No focal abdominal tenderness.  Heart and lung sounds are unremarkable.  Will obtain basic labs and CT imaging to rule out  possible complicating stone. Patient labs are reassuring with mild leukocytosis 11.5.  Appears that patient someone has a baseline elevated white count most the time as well as elevated platelets.  BMP unremarkable with only mild hypokalemia seen.  UA without obvious signs of infection.  Urine culture collected and pending. CT renal negative for any acute findings.  80 mm nonobstructing left renal calculus is seen but likely not acute source of symptoms.  Given reassuring findings, advised patient to take ibuprofen at home for pain management.  Return precautions discussed such as development of fevers, severe hematuria, urinary retention, or any new or concerning symptoms.  Patient is otherwise stable and will plan on outpatient follow-up.  Discharged home in stable condition.  Final Clinical Impression(s) / ED Diagnoses Final diagnoses:  Left flank pain    Rx / DC Orders ED Discharge Orders     None         Salomon Mast 07/02/23 2317    Eber Hong, MD 07/04/23 1810

## 2023-07-02 NOTE — ED Triage Notes (Signed)
 Pt c/o left flank pain, was recently dx with a UTI and is currently taking antibiotics.

## 2023-07-04 LAB — URINE CULTURE: Special Requests: NORMAL

## 2023-07-11 DIAGNOSIS — N200 Calculus of kidney: Secondary | ICD-10-CM | POA: Diagnosis not present

## 2023-07-11 DIAGNOSIS — I10 Essential (primary) hypertension: Secondary | ICD-10-CM | POA: Diagnosis not present

## 2023-07-11 DIAGNOSIS — Z299 Encounter for prophylactic measures, unspecified: Secondary | ICD-10-CM | POA: Diagnosis not present

## 2023-07-24 DIAGNOSIS — C61 Malignant neoplasm of prostate: Secondary | ICD-10-CM | POA: Diagnosis not present

## 2023-07-24 DIAGNOSIS — N200 Calculus of kidney: Secondary | ICD-10-CM | POA: Diagnosis not present

## 2023-07-24 DIAGNOSIS — R8279 Other abnormal findings on microbiological examination of urine: Secondary | ICD-10-CM | POA: Diagnosis not present

## 2023-07-27 DIAGNOSIS — Z1339 Encounter for screening examination for other mental health and behavioral disorders: Secondary | ICD-10-CM | POA: Diagnosis not present

## 2023-07-27 DIAGNOSIS — Z1331 Encounter for screening for depression: Secondary | ICD-10-CM | POA: Diagnosis not present

## 2023-07-27 DIAGNOSIS — Z7189 Other specified counseling: Secondary | ICD-10-CM | POA: Diagnosis not present

## 2023-07-27 DIAGNOSIS — I10 Essential (primary) hypertension: Secondary | ICD-10-CM | POA: Diagnosis not present

## 2023-07-27 DIAGNOSIS — E6609 Other obesity due to excess calories: Secondary | ICD-10-CM | POA: Diagnosis not present

## 2023-07-27 DIAGNOSIS — Z0000 Encounter for general adult medical examination without abnormal findings: Secondary | ICD-10-CM | POA: Diagnosis not present

## 2023-07-27 DIAGNOSIS — R5383 Other fatigue: Secondary | ICD-10-CM | POA: Diagnosis not present

## 2023-07-27 DIAGNOSIS — Z299 Encounter for prophylactic measures, unspecified: Secondary | ICD-10-CM | POA: Diagnosis not present

## 2023-07-27 DIAGNOSIS — Z6831 Body mass index (BMI) 31.0-31.9, adult: Secondary | ICD-10-CM | POA: Diagnosis not present

## 2023-08-18 DIAGNOSIS — N390 Urinary tract infection, site not specified: Secondary | ICD-10-CM | POA: Diagnosis not present

## 2023-08-24 DIAGNOSIS — I10 Essential (primary) hypertension: Secondary | ICD-10-CM | POA: Diagnosis not present

## 2023-08-24 DIAGNOSIS — R350 Frequency of micturition: Secondary | ICD-10-CM | POA: Diagnosis not present

## 2023-08-24 DIAGNOSIS — M25552 Pain in left hip: Secondary | ICD-10-CM | POA: Diagnosis not present

## 2023-08-24 DIAGNOSIS — R32 Unspecified urinary incontinence: Secondary | ICD-10-CM | POA: Diagnosis not present

## 2023-08-24 DIAGNOSIS — Z299 Encounter for prophylactic measures, unspecified: Secondary | ICD-10-CM | POA: Diagnosis not present

## 2023-09-12 ENCOUNTER — Other Ambulatory Visit: Payer: Self-pay | Admitting: Cardiology

## 2023-09-13 DIAGNOSIS — I4891 Unspecified atrial fibrillation: Secondary | ICD-10-CM | POA: Diagnosis not present

## 2023-09-13 DIAGNOSIS — M6281 Muscle weakness (generalized): Secondary | ICD-10-CM | POA: Diagnosis not present

## 2023-09-13 DIAGNOSIS — N400 Enlarged prostate without lower urinary tract symptoms: Secondary | ICD-10-CM | POA: Diagnosis not present

## 2023-09-13 DIAGNOSIS — I5030 Unspecified diastolic (congestive) heart failure: Secondary | ICD-10-CM | POA: Diagnosis not present

## 2023-09-13 DIAGNOSIS — Z86718 Personal history of other venous thrombosis and embolism: Secondary | ICD-10-CM | POA: Diagnosis not present

## 2023-09-13 DIAGNOSIS — D649 Anemia, unspecified: Secondary | ICD-10-CM | POA: Diagnosis not present

## 2023-09-13 DIAGNOSIS — E876 Hypokalemia: Secondary | ICD-10-CM | POA: Diagnosis not present

## 2023-09-13 DIAGNOSIS — G4733 Obstructive sleep apnea (adult) (pediatric): Secondary | ICD-10-CM | POA: Diagnosis not present

## 2023-09-13 DIAGNOSIS — J449 Chronic obstructive pulmonary disease, unspecified: Secondary | ICD-10-CM | POA: Diagnosis not present

## 2023-09-14 DIAGNOSIS — Z79899 Other long term (current) drug therapy: Secondary | ICD-10-CM | POA: Diagnosis not present

## 2023-09-14 DIAGNOSIS — Z8673 Personal history of transient ischemic attack (TIA), and cerebral infarction without residual deficits: Secondary | ICD-10-CM | POA: Diagnosis not present

## 2023-09-14 DIAGNOSIS — I272 Pulmonary hypertension, unspecified: Secondary | ICD-10-CM | POA: Diagnosis not present

## 2023-09-14 DIAGNOSIS — I361 Nonrheumatic tricuspid (valve) insufficiency: Secondary | ICD-10-CM | POA: Diagnosis not present

## 2023-09-14 DIAGNOSIS — I4891 Unspecified atrial fibrillation: Secondary | ICD-10-CM | POA: Diagnosis not present

## 2023-09-14 DIAGNOSIS — E119 Type 2 diabetes mellitus without complications: Secondary | ICD-10-CM | POA: Diagnosis not present

## 2023-09-14 DIAGNOSIS — R569 Unspecified convulsions: Secondary | ICD-10-CM | POA: Diagnosis not present

## 2023-09-14 DIAGNOSIS — I5030 Unspecified diastolic (congestive) heart failure: Secondary | ICD-10-CM | POA: Diagnosis not present

## 2023-09-14 DIAGNOSIS — I351 Nonrheumatic aortic (valve) insufficiency: Secondary | ICD-10-CM | POA: Diagnosis not present

## 2023-09-14 DIAGNOSIS — N400 Enlarged prostate without lower urinary tract symptoms: Secondary | ICD-10-CM | POA: Diagnosis not present

## 2023-09-14 DIAGNOSIS — Z7982 Long term (current) use of aspirin: Secondary | ICD-10-CM | POA: Diagnosis not present

## 2023-09-14 DIAGNOSIS — J449 Chronic obstructive pulmonary disease, unspecified: Secondary | ICD-10-CM | POA: Diagnosis not present

## 2023-09-15 DIAGNOSIS — J449 Chronic obstructive pulmonary disease, unspecified: Secondary | ICD-10-CM | POA: Diagnosis not present

## 2023-09-15 DIAGNOSIS — F0390 Unspecified dementia without behavioral disturbance: Secondary | ICD-10-CM | POA: Diagnosis not present

## 2023-09-15 DIAGNOSIS — I4891 Unspecified atrial fibrillation: Secondary | ICD-10-CM | POA: Diagnosis not present

## 2023-09-15 DIAGNOSIS — I5020 Unspecified systolic (congestive) heart failure: Secondary | ICD-10-CM | POA: Diagnosis not present

## 2023-09-15 DIAGNOSIS — Z79899 Other long term (current) drug therapy: Secondary | ICD-10-CM | POA: Diagnosis not present

## 2023-09-15 DIAGNOSIS — G4733 Obstructive sleep apnea (adult) (pediatric): Secondary | ICD-10-CM | POA: Diagnosis not present

## 2023-09-15 DIAGNOSIS — N400 Enlarged prostate without lower urinary tract symptoms: Secondary | ICD-10-CM | POA: Diagnosis not present

## 2023-09-15 DIAGNOSIS — E119 Type 2 diabetes mellitus without complications: Secondary | ICD-10-CM | POA: Diagnosis not present

## 2023-09-15 DIAGNOSIS — R569 Unspecified convulsions: Secondary | ICD-10-CM | POA: Diagnosis not present

## 2023-09-16 DIAGNOSIS — E119 Type 2 diabetes mellitus without complications: Secondary | ICD-10-CM | POA: Diagnosis not present

## 2023-09-16 DIAGNOSIS — F0390 Unspecified dementia without behavioral disturbance: Secondary | ICD-10-CM | POA: Diagnosis not present

## 2023-09-16 DIAGNOSIS — G4733 Obstructive sleep apnea (adult) (pediatric): Secondary | ICD-10-CM | POA: Diagnosis not present

## 2023-09-16 DIAGNOSIS — I4891 Unspecified atrial fibrillation: Secondary | ICD-10-CM | POA: Diagnosis not present

## 2023-09-16 DIAGNOSIS — Z86718 Personal history of other venous thrombosis and embolism: Secondary | ICD-10-CM | POA: Diagnosis not present

## 2023-09-16 DIAGNOSIS — Z9989 Dependence on other enabling machines and devices: Secondary | ICD-10-CM | POA: Diagnosis not present

## 2023-09-16 DIAGNOSIS — J449 Chronic obstructive pulmonary disease, unspecified: Secondary | ICD-10-CM | POA: Diagnosis not present

## 2023-09-16 DIAGNOSIS — J849 Interstitial pulmonary disease, unspecified: Secondary | ICD-10-CM | POA: Diagnosis not present

## 2023-09-16 DIAGNOSIS — Z8673 Personal history of transient ischemic attack (TIA), and cerebral infarction without residual deficits: Secondary | ICD-10-CM | POA: Diagnosis not present

## 2023-09-16 DIAGNOSIS — I5040 Unspecified combined systolic (congestive) and diastolic (congestive) heart failure: Secondary | ICD-10-CM | POA: Diagnosis not present

## 2023-09-16 DIAGNOSIS — I5020 Unspecified systolic (congestive) heart failure: Secondary | ICD-10-CM | POA: Diagnosis not present

## 2023-09-16 DIAGNOSIS — R569 Unspecified convulsions: Secondary | ICD-10-CM | POA: Diagnosis not present

## 2023-09-16 DIAGNOSIS — N400 Enlarged prostate without lower urinary tract symptoms: Secondary | ICD-10-CM | POA: Diagnosis not present

## 2023-09-20 DIAGNOSIS — I4891 Unspecified atrial fibrillation: Secondary | ICD-10-CM | POA: Diagnosis not present

## 2023-09-26 DIAGNOSIS — I10 Essential (primary) hypertension: Secondary | ICD-10-CM | POA: Diagnosis not present

## 2023-09-26 DIAGNOSIS — Z299 Encounter for prophylactic measures, unspecified: Secondary | ICD-10-CM | POA: Diagnosis not present

## 2023-09-26 DIAGNOSIS — I480 Paroxysmal atrial fibrillation: Secondary | ICD-10-CM | POA: Diagnosis not present

## 2023-09-26 DIAGNOSIS — I429 Cardiomyopathy, unspecified: Secondary | ICD-10-CM | POA: Diagnosis not present

## 2023-09-26 DIAGNOSIS — I5022 Chronic systolic (congestive) heart failure: Secondary | ICD-10-CM | POA: Diagnosis not present

## 2023-09-26 DIAGNOSIS — J849 Interstitial pulmonary disease, unspecified: Secondary | ICD-10-CM | POA: Diagnosis not present

## 2023-10-09 DIAGNOSIS — F0390 Unspecified dementia without behavioral disturbance: Secondary | ICD-10-CM | POA: Diagnosis not present

## 2023-10-09 DIAGNOSIS — J449 Chronic obstructive pulmonary disease, unspecified: Secondary | ICD-10-CM | POA: Diagnosis not present

## 2023-10-09 DIAGNOSIS — Z299 Encounter for prophylactic measures, unspecified: Secondary | ICD-10-CM | POA: Diagnosis not present

## 2023-10-09 DIAGNOSIS — I10 Essential (primary) hypertension: Secondary | ICD-10-CM | POA: Diagnosis not present

## 2023-10-09 DIAGNOSIS — F1320 Sedative, hypnotic or anxiolytic dependence, uncomplicated: Secondary | ICD-10-CM | POA: Diagnosis not present

## 2023-10-09 DIAGNOSIS — Z0000 Encounter for general adult medical examination without abnormal findings: Secondary | ICD-10-CM | POA: Diagnosis not present

## 2023-10-09 DIAGNOSIS — E1165 Type 2 diabetes mellitus with hyperglycemia: Secondary | ICD-10-CM | POA: Diagnosis not present

## 2023-10-09 DIAGNOSIS — I5022 Chronic systolic (congestive) heart failure: Secondary | ICD-10-CM | POA: Diagnosis not present

## 2023-10-10 ENCOUNTER — Telehealth: Payer: Self-pay | Admitting: Cardiology

## 2023-10-10 NOTE — Telephone Encounter (Signed)
 FYI. Unable to reach patient. 5 calls with mailed letters. Will delete recall.

## 2023-10-11 DIAGNOSIS — I4891 Unspecified atrial fibrillation: Secondary | ICD-10-CM | POA: Diagnosis not present

## 2023-10-12 DIAGNOSIS — I471 Supraventricular tachycardia, unspecified: Secondary | ICD-10-CM | POA: Diagnosis not present

## 2023-10-12 DIAGNOSIS — I491 Atrial premature depolarization: Secondary | ICD-10-CM | POA: Diagnosis not present

## 2023-10-12 DIAGNOSIS — I4729 Other ventricular tachycardia: Secondary | ICD-10-CM | POA: Diagnosis not present

## 2023-10-12 DIAGNOSIS — I493 Ventricular premature depolarization: Secondary | ICD-10-CM | POA: Diagnosis not present

## 2023-11-09 ENCOUNTER — Other Ambulatory Visit: Payer: Self-pay | Admitting: Cardiology

## 2023-11-14 DIAGNOSIS — Z299 Encounter for prophylactic measures, unspecified: Secondary | ICD-10-CM | POA: Diagnosis not present

## 2023-11-14 DIAGNOSIS — F419 Anxiety disorder, unspecified: Secondary | ICD-10-CM | POA: Diagnosis not present

## 2023-11-14 DIAGNOSIS — J449 Chronic obstructive pulmonary disease, unspecified: Secondary | ICD-10-CM | POA: Diagnosis not present

## 2023-11-14 DIAGNOSIS — I10 Essential (primary) hypertension: Secondary | ICD-10-CM | POA: Diagnosis not present

## 2023-12-06 ENCOUNTER — Telehealth: Payer: Self-pay | Admitting: Cardiology

## 2023-12-06 ENCOUNTER — Ambulatory Visit: Attending: Nurse Practitioner | Admitting: Nurse Practitioner

## 2023-12-06 ENCOUNTER — Encounter: Payer: Self-pay | Admitting: Nurse Practitioner

## 2023-12-06 VITALS — BP 122/78 | HR 111 | Ht 69.0 in | Wt 188.6 lb

## 2023-12-06 DIAGNOSIS — I10 Essential (primary) hypertension: Secondary | ICD-10-CM | POA: Diagnosis not present

## 2023-12-06 DIAGNOSIS — I480 Paroxysmal atrial fibrillation: Secondary | ICD-10-CM | POA: Diagnosis not present

## 2023-12-06 DIAGNOSIS — I4729 Other ventricular tachycardia: Secondary | ICD-10-CM

## 2023-12-06 DIAGNOSIS — E785 Hyperlipidemia, unspecified: Secondary | ICD-10-CM | POA: Diagnosis not present

## 2023-12-06 DIAGNOSIS — Z79899 Other long term (current) drug therapy: Secondary | ICD-10-CM

## 2023-12-06 DIAGNOSIS — I5020 Unspecified systolic (congestive) heart failure: Secondary | ICD-10-CM

## 2023-12-06 DIAGNOSIS — R634 Abnormal weight loss: Secondary | ICD-10-CM

## 2023-12-06 DIAGNOSIS — G4733 Obstructive sleep apnea (adult) (pediatric): Secondary | ICD-10-CM

## 2023-12-06 DIAGNOSIS — J449 Chronic obstructive pulmonary disease, unspecified: Secondary | ICD-10-CM

## 2023-12-06 DIAGNOSIS — I471 Supraventricular tachycardia, unspecified: Secondary | ICD-10-CM

## 2023-12-06 DIAGNOSIS — R0609 Other forms of dyspnea: Secondary | ICD-10-CM

## 2023-12-06 MED ORDER — METOPROLOL SUCCINATE ER 25 MG PO TB24: 25.0000 mg | ORAL_TABLET | Freq: Two times a day (BID) | ORAL | 1 refills | Status: DC

## 2023-12-06 NOTE — Progress Notes (Signed)
 Cardiology Office Note   Date:  12/06/2023 ID:  Derrick Bender, Derrick Bender [birth year], MRN [id number] PCP: Rosamond Leta NOVAK, MD  [facility] Providers Cardiologist:  Alvan Carrier, MD     History of Present Illness Derrick Bender is a 78 y.o. male with a PMH of hypertension, OSA, hyperlipidemia, history of TIA, COPD, and leg edema, presents today for overdue follow-up.  Appears he was diagnosed with a new onset A-fib at [facility] in May 2025, however AV ZIO monitor in June 2025 did not detect any A-fib.  He was predominantly in sinus rhythm with average heart rate of 71 bpm.  He did have occasional supraventricular ectopic beats, PACs with 2% burden, 24 episodes of SVT, longest lasting 16 beats at 125 bpm.  And occasional ventricular ectopies around 2% burden.  It was noted he had multiple episodes of wide-complex tachycardia-see full report below-longest episode lasted 15 seconds at 137 bpm.   Today he presents for follow-up with his daughter. He admits to DOE and admits to issues with his CPAP machine. Weights are coming down, admits to unintentional weight loss, daughter says he is not eating as much as he should. Sadly, wife has Lewy body dementia and is not getting enough rest as he is helping provide care for her. Daughter tells me that he is on Eliquis due to previous DVT diagnosed along lower extremity at [facility] after back surgery performed in November 2024. Lower extremity doppler was performed in December 2024 at [facility] was positive for occlusive DVT throughout left femoral, popliteal, and calf veins. Daughter is not certain what dosage of Eliquis he is on - believes he is on Eliquis 2.5 mg BID. Does admit to some palpations and DOE, difficult for him to describe fully. Denies any chest pain, syncope, presyncope, dizziness, orthopnea, PND, swelling or significant weight changes, acute bleeding, or claudication.   ROS: Negative. See HPI.   Studies Reviewed  EKG:  EKG  Interpretation Date/Time:  Thursday December 06 2023 15:25:11 EDT Ventricular Rate:  111 PR Interval:  124 QRS Duration:  92 QT Interval:  336 QTC Calculation: 456 R Axis:   -6  Text Interpretation: Sinus tachycardia with occasional and consecutive Premature ventricular complexes Minimal voltage criteria for LVH, may be normal variant ( R in aVL ) Anterolateral infarct , age undetermined When compared with ECG of 02-May-2023 12:14, PREVIOUS ECG IS PRESENT Confirmed by Miriam Norris [phone]) on 12/06/2023 3:39:02 PM   Zio monitor [facility]) 09/2023:  Conclusions:  - Ambulatory ECG monitoring was performed from 09/16/23 to 09/30/23.  - The predominant rhythm was sinus rhythm, with the rate ranging from 42  to 117 and averaging 71 bpm when in sinus rhythm  - Occasional supraventricular ectopics (PACs) were recorded (2.0% burden)  with 24 episodes of SVT, longest lasting 16 beats at 125 bpm  - Occasional ventricular ectopics (PVCs) were recorded (1.8% burden) with  4003 episodes of wide-complex tachycardia, longest lasting 15.4 seconds at  137 bpm  - No patient-initiated recordings/events were submitted.  - No pauses >3 seconds or high-grade AV block detected  - No atrial fibrillation detected  Echocardiogram in May 2025 with [facility]:  EF 25 to 30%, mild AR, right ventricular was felt to be probably normal systolic function with moderate pulmonary hypertension.   Lexiscan  12/2015:  No diagnostic ST segment changes to indicate ischemia. Occasional PVCs. Small, mild intensity, inferior defect that is fixed and most prominent at rest, consistent with soft tissue attenuation. No large ischemic  territories. This is a low risk study. Nuclear stress EF: 65%.      Physical Exam VS:  BP 122/78   Pulse (!) 111   Ht 5' 9 (1.753 m)   Wt 188 lb 9.6 oz (85.5 kg)   SpO2 98%   BMI 27.85 kg/m        Wt Readings from Last 3 Encounters:  12/06/23 188 lb 9.6 oz (85.5 kg)  05/02/23 218 lb 4.1 oz (99 kg)   09/01/22 218 lb 3.2 oz (99 kg)    GEN: Well nourished, well developed in no acute distress NECK: No JVD; No carotid bruits CARDIAC: S1/S2, fast rate and regular rhythm, no murmurs, rubs, gallops RESPIRATORY:  Clear to auscultation without rales, wheezing or rhonchi  ABDOMEN: Soft, non-tender, non-distended EXTREMITIES:  No edema; No deformity   ASSESSMENT AND PLAN  HFrEF, DOE, medication management Stage C, NYHA class II-III symptoms. EF 25-30% in May 2025 at [facility]. Will increase Toprol  XL to 25 mg BID. Will obtain proBNP, BMET, and Mag in 1 week. If kidney function permits, will begin Jardiance and provide patient assistance if needed. Will bring him back in 1 week for EKG visit. Continue Entresto and Lasix . Low sodium diet, fluid restriction <2L, and daily weights encouraged. Educated to contact our office for weight gain of 2 lbs overnight or 5 lbs in one week.   PAF, PSVT, NSVT EKG confirms ST. See most recent monitor report from [facility] above. Does admit to some palpitations and DOE. Will increase Toprol  XL to 25 mg BID. Will bring him back in 1 week to repeat an EKG. I did suggest and recommend to call and verify the dose of Eliquis he is on - daughter requests for this to be verified at EKG visit - will arrange. Checking labs as noted above. Discussed avoiding triggers to A-fib.   HTN BP is stable. Discussed to monitor BP at home at least 2 hours after medications and sitting for 5-10 minutes. Increasing Toprol  XL as noted above. No other med changes at this time. Heart healthy diet and regular cardiovascular exercise encouraged.   HLD LDL 135 08/2023. Not addressed at this visit, but plan to address at next visit. Heart healthy diet and regular cardiovascular exercise encouraged.   OSA, COPD Admits to issues with his CPAP machine. Will refer him to Pulmonology for further evaluation regarding his CPAP machine as well as further evaluate his COPD.    Unintentional weight loss Most  likely d/t caregiver fatigue - see HPI. Will hold off on Jardiance at this time as we discussed possible concern for weight loss with this medication. Will revisit at next office visit.   I spent a total duration of 45 minutes reviewing prior notes, reviewing outside records including  labs, EKG today, face-to-face counseling of medical condition, pathophysiology, evaluation, management, and documenting the findings in the note.     Dispo: Care and ED precautions discussed. Follow-up with MD/APP in 4-6 weeks or sooner if anything changes.   Signed, Almarie Crate, NP

## 2023-12-06 NOTE — Telephone Encounter (Signed)
 Will make provider aware - can be discussed at OV today.

## 2023-12-06 NOTE — Patient Instructions (Addendum)
 Medication Instructions:  Your physician has recommended you make the following change in your medication:  Please Increase Toprol  XL to 25 Mg Twice daily   Labwork: In 1 week at [facility]   Testing/Procedures: None   Follow-Up: Your physician recommends that you schedule a follow-up appointment in:  1 week for EKG and medication review  4-6 weeks   Any Other Special Instructions Will Be Listed Below (If Applicable).  If you need a refill on your cardiac medications before your next appointment, please call your pharmacy.  HEART FAILURE INSTRUCTION SHEET  Follow a low-salt diet-you are allowed no more than 2,000 mg of sodium per day. Watch your fluid intake. In general, you should not be taking more than 64 ounces a day (no more than 8 glasses per day). Sometimes we refer to this as 2 liters per day. This includes sources of water in food like soup, coffee, tea, milk etc. Weigh yourself on the same scale at the same time of the day preferably immediately after your first void. Keep a log of your weights. Call your doctor: (Anytime you feel any of the following symptoms)  3 lbs weight gain overnight or 5 lbs within a week Shortness of breath, with or without a day hacking cough Swelling in hands, feet or stomach If you have to sleep on extra pillows at night in order to breathe   IT IS IMPORTANT TO LET YOUR DOCTOR KNOW EARLY ON IF YOU ARE HAVING SYMPTOMS SO WE CAN HELP YOU!

## 2023-12-06 NOTE — Telephone Encounter (Signed)
 Sharyne sharps came into [place] office in regards to pt CPAP order. Mr.Fosters is leaking air out the side and it has been over a year and they still have not heard anything about it.

## 2023-12-13 ENCOUNTER — Encounter: Payer: Self-pay | Admitting: *Deleted

## 2023-12-13 ENCOUNTER — Ambulatory Visit: Attending: Cardiology | Admitting: *Deleted

## 2023-12-13 VITALS — Ht 69.0 in | Wt 192.8 lb

## 2023-12-13 DIAGNOSIS — Z79899 Other long term (current) drug therapy: Secondary | ICD-10-CM | POA: Diagnosis not present

## 2023-12-13 DIAGNOSIS — E7800 Pure hypercholesterolemia, unspecified: Secondary | ICD-10-CM | POA: Diagnosis not present

## 2023-12-13 DIAGNOSIS — F419 Anxiety disorder, unspecified: Secondary | ICD-10-CM | POA: Diagnosis not present

## 2023-12-13 DIAGNOSIS — R5383 Other fatigue: Secondary | ICD-10-CM | POA: Diagnosis not present

## 2023-12-13 DIAGNOSIS — I480 Paroxysmal atrial fibrillation: Secondary | ICD-10-CM

## 2023-12-13 NOTE — Patient Instructions (Signed)
Your physician recommends that you continue on your current medications as directed. Please refer to the Current Medication list given to you today. Your physician recommends that you schedule a follow-up appointment in: as planned 

## 2023-12-13 NOTE — Progress Notes (Signed)
 Presents to office for nurse visit for an EKG and medication review per last visit.  Brother with patient. Medication bottles brought to office for review. Medication profile updated. Reports taking all doses as prescribed without side effects. Denies dizziness, chest pain or SOB. EKG done and routed to provider for review.

## 2023-12-21 DIAGNOSIS — Z299 Encounter for prophylactic measures, unspecified: Secondary | ICD-10-CM | POA: Diagnosis not present

## 2023-12-21 DIAGNOSIS — I10 Essential (primary) hypertension: Secondary | ICD-10-CM | POA: Diagnosis not present

## 2023-12-21 DIAGNOSIS — J440 Chronic obstructive pulmonary disease with acute lower respiratory infection: Secondary | ICD-10-CM | POA: Diagnosis not present

## 2023-12-31 ENCOUNTER — Inpatient Hospital Stay (HOSPITAL_COMMUNITY)
Admission: EM | Admit: 2023-12-31 | Discharge: 2024-01-03 | DRG: 291 | Disposition: A | Discharge status: Home Health | Attending: Internal Medicine | Admitting: Internal Medicine

## 2023-12-31 ENCOUNTER — Emergency Department (HOSPITAL_COMMUNITY)

## 2023-12-31 ENCOUNTER — Other Ambulatory Visit: Payer: Self-pay

## 2023-12-31 ENCOUNTER — Encounter (HOSPITAL_COMMUNITY): Payer: Self-pay | Admitting: Emergency Medicine

## 2023-12-31 DIAGNOSIS — D649 Anemia, unspecified: Secondary | ICD-10-CM | POA: Diagnosis not present

## 2023-12-31 DIAGNOSIS — I5023 Acute on chronic systolic (congestive) heart failure: Secondary | ICD-10-CM | POA: Diagnosis present

## 2023-12-31 DIAGNOSIS — Z87891 Personal history of nicotine dependence: Secondary | ICD-10-CM | POA: Diagnosis not present

## 2023-12-31 DIAGNOSIS — I160 Hypertensive urgency: Secondary | ICD-10-CM | POA: Diagnosis not present

## 2023-12-31 DIAGNOSIS — Z86718 Personal history of other venous thrombosis and embolism: Secondary | ICD-10-CM

## 2023-12-31 DIAGNOSIS — E785 Hyperlipidemia, unspecified: Secondary | ICD-10-CM | POA: Diagnosis present

## 2023-12-31 DIAGNOSIS — Z8546 Personal history of malignant neoplasm of prostate: Secondary | ICD-10-CM | POA: Diagnosis not present

## 2023-12-31 DIAGNOSIS — Z79899 Other long term (current) drug therapy: Secondary | ICD-10-CM | POA: Diagnosis not present

## 2023-12-31 DIAGNOSIS — Z1152 Encounter for screening for COVID-19: Secondary | ICD-10-CM

## 2023-12-31 DIAGNOSIS — R4189 Other symptoms and signs involving cognitive functions and awareness: Secondary | ICD-10-CM | POA: Diagnosis present

## 2023-12-31 DIAGNOSIS — Z882 Allergy status to sulfonamides status: Secondary | ICD-10-CM

## 2023-12-31 DIAGNOSIS — I509 Heart failure, unspecified: Secondary | ICD-10-CM | POA: Diagnosis not present

## 2023-12-31 DIAGNOSIS — R0609 Other forms of dyspnea: Secondary | ICD-10-CM | POA: Diagnosis not present

## 2023-12-31 DIAGNOSIS — I161 Hypertensive emergency: Secondary | ICD-10-CM | POA: Diagnosis present

## 2023-12-31 DIAGNOSIS — J4489 Other specified chronic obstructive pulmonary disease: Secondary | ICD-10-CM | POA: Diagnosis present

## 2023-12-31 DIAGNOSIS — I493 Ventricular premature depolarization: Secondary | ICD-10-CM | POA: Diagnosis present

## 2023-12-31 DIAGNOSIS — I471 Supraventricular tachycardia, unspecified: Secondary | ICD-10-CM

## 2023-12-31 DIAGNOSIS — I480 Paroxysmal atrial fibrillation: Secondary | ICD-10-CM | POA: Diagnosis not present

## 2023-12-31 DIAGNOSIS — Z800 Family history of malignant neoplasm of digestive organs: Secondary | ICD-10-CM

## 2023-12-31 DIAGNOSIS — F32A Depression, unspecified: Secondary | ICD-10-CM | POA: Diagnosis present

## 2023-12-31 DIAGNOSIS — E876 Hypokalemia: Secondary | ICD-10-CM | POA: Diagnosis present

## 2023-12-31 DIAGNOSIS — Z79890 Hormone replacement therapy (postmenopausal): Secondary | ICD-10-CM

## 2023-12-31 DIAGNOSIS — Z8673 Personal history of transient ischemic attack (TIA), and cerebral infarction without residual deficits: Secondary | ICD-10-CM

## 2023-12-31 DIAGNOSIS — E1165 Type 2 diabetes mellitus with hyperglycemia: Secondary | ICD-10-CM | POA: Diagnosis present

## 2023-12-31 DIAGNOSIS — Z7951 Long term (current) use of inhaled steroids: Secondary | ICD-10-CM

## 2023-12-31 DIAGNOSIS — I272 Pulmonary hypertension, unspecified: Secondary | ICD-10-CM | POA: Diagnosis present

## 2023-12-31 DIAGNOSIS — Z7901 Long term (current) use of anticoagulants: Secondary | ICD-10-CM | POA: Diagnosis not present

## 2023-12-31 DIAGNOSIS — J9601 Acute respiratory failure with hypoxia: Secondary | ICD-10-CM | POA: Diagnosis not present

## 2023-12-31 DIAGNOSIS — R001 Bradycardia, unspecified: Secondary | ICD-10-CM | POA: Diagnosis present

## 2023-12-31 DIAGNOSIS — F419 Anxiety disorder, unspecified: Secondary | ICD-10-CM | POA: Diagnosis present

## 2023-12-31 DIAGNOSIS — I110 Hypertensive heart disease with heart failure: Secondary | ICD-10-CM | POA: Diagnosis not present

## 2023-12-31 DIAGNOSIS — N400 Enlarged prostate without lower urinary tract symptoms: Secondary | ICD-10-CM | POA: Diagnosis present

## 2023-12-31 DIAGNOSIS — Z7952 Long term (current) use of systemic steroids: Secondary | ICD-10-CM

## 2023-12-31 DIAGNOSIS — G4733 Obstructive sleep apnea (adult) (pediatric): Secondary | ICD-10-CM | POA: Diagnosis not present

## 2023-12-31 DIAGNOSIS — R0600 Dyspnea, unspecified: Secondary | ICD-10-CM | POA: Diagnosis not present

## 2023-12-31 DIAGNOSIS — J984 Other disorders of lung: Secondary | ICD-10-CM | POA: Diagnosis not present

## 2023-12-31 DIAGNOSIS — R410 Disorientation, unspecified: Secondary | ICD-10-CM | POA: Diagnosis present

## 2023-12-31 LAB — COMPREHENSIVE METABOLIC PANEL WITH GFR
ALT: 11 U/L (ref 0–44)
AST: 14 U/L — ABNORMAL LOW (ref 15–41)
Albumin: 3.2 g/dL — ABNORMAL LOW (ref 3.5–5.0)
Alkaline Phosphatase: 71 U/L (ref 38–126)
Anion gap: 11 (ref 5–15)
BUN: 25 mg/dL — ABNORMAL HIGH (ref 8–23)
CO2: 28 mmol/L (ref 22–32)
Calcium: 8.3 mg/dL — ABNORMAL LOW (ref 8.9–10.3)
Chloride: 109 mmol/L (ref 98–111)
Creatinine, Ser: 1.24 mg/dL (ref 0.61–1.24)
GFR, Estimated: 60 mL/min — ABNORMAL LOW (ref >60)
Glucose, Bld: 84 mg/dL (ref 70–99)
Potassium: 3.5 mmol/L (ref 3.5–5.1)
Sodium: 148 mmol/L — ABNORMAL HIGH (ref 135–145)
Total Bilirubin: 0.8 mg/dL (ref 0.0–1.2)
Total Protein: 6.1 g/dL — ABNORMAL LOW (ref 6.5–8.1)

## 2023-12-31 LAB — CBC WITH DIFFERENTIAL/PLATELET
Abs Immature Granulocytes: 0.01 K/uL (ref 0.00–0.07)
Basophils Absolute: 0 K/uL (ref 0.0–0.1)
Basophils Relative: 0 %
Eosinophils Absolute: 0.2 K/uL (ref 0.0–0.5)
Eosinophils Relative: 2 %
HCT: 39.4 % (ref 39.0–52.0)
Hemoglobin: 11.5 g/dL — ABNORMAL LOW (ref 13.0–17.0)
Immature Granulocytes: 0 %
Lymphocytes Relative: 29 %
Lymphs Abs: 2.6 K/uL (ref 0.7–4.0)
MCH: 24 pg — ABNORMAL LOW (ref 26.0–34.0)
MCHC: 29.2 g/dL — ABNORMAL LOW (ref 30.0–36.0)
MCV: 82.3 fL (ref 80.0–100.0)
Monocytes Absolute: 0.9 K/uL (ref 0.1–1.0)
Monocytes Relative: 10 %
Neutro Abs: 5.2 K/uL (ref 1.7–7.7)
Neutrophils Relative %: 59 %
Platelets: 226 K/uL (ref 150–400)
RBC: 4.79 MIL/uL (ref 4.22–5.81)
RDW: 23.9 % — ABNORMAL HIGH (ref 11.5–15.5)
WBC: 8.8 K/uL (ref 4.0–10.5)
nRBC: 0 % (ref 0.0–0.2)

## 2023-12-31 LAB — URINALYSIS, ROUTINE W REFLEX MICROSCOPIC
Bilirubin Urine: NEGATIVE
Glucose, UA: NEGATIVE mg/dL
Hgb urine dipstick: NEGATIVE
Ketones, ur: NEGATIVE mg/dL
Leukocytes,Ua: NEGATIVE
Nitrite: NEGATIVE
Protein, ur: NEGATIVE mg/dL
Specific Gravity, Urine: 1.016 (ref 1.005–1.030)
pH: 6 (ref 5.0–8.0)

## 2023-12-31 LAB — PROTIME-INR
INR: 1.4 — ABNORMAL HIGH (ref 0.8–1.2)
Prothrombin Time: 17.6 s — ABNORMAL HIGH (ref 11.4–15.2)

## 2023-12-31 LAB — MRSA NEXT GEN BY PCR, NASAL: MRSA by PCR Next Gen: NOT DETECTED

## 2023-12-31 LAB — MAGNESIUM: Magnesium: 2.1 mg/dL (ref 1.7–2.4)

## 2023-12-31 LAB — PHOSPHORUS: Phosphorus: 2.6 mg/dL (ref 2.5–4.6)

## 2023-12-31 LAB — TSH: TSH: 0.964 u[IU]/mL (ref 0.350–4.500)

## 2023-12-31 LAB — BRAIN NATRIURETIC PEPTIDE: B Natriuretic Peptide: 1757 pg/mL — ABNORMAL HIGH (ref 0.0–100.0)

## 2023-12-31 LAB — TROPONIN I (HIGH SENSITIVITY)
Troponin I (High Sensitivity): 13 ng/L (ref <18)
Troponin I (High Sensitivity): 9 ng/L (ref <18)

## 2023-12-31 LAB — CBG MONITORING, ED: Glucose-Capillary: 78 mg/dL (ref 70–99)

## 2023-12-31 LAB — RESP PANEL BY RT-PCR (RSV, FLU A&B, COVID)  RVPGX2
Influenza A by PCR: NEGATIVE
Influenza B by PCR: NEGATIVE
Resp Syncytial Virus by PCR: NEGATIVE
SARS Coronavirus 2 by RT PCR: NEGATIVE

## 2023-12-31 MED ORDER — ACETAMINOPHEN 325 MG PO TABS
650.0000 mg | ORAL_TABLET | Freq: Four times a day (QID) | ORAL | Status: DC | PRN
  Administered 2023-12-31 – 2024-01-03 (×3): 650 mg via ORAL
  Filled 2023-12-31 (×3): qty 2

## 2023-12-31 MED ORDER — CHLORHEXIDINE GLUCONATE CLOTH 2 % EX PADS
6.0000 | MEDICATED_PAD | Freq: Every day | CUTANEOUS | Status: DC
  Administered 2024-01-01 – 2024-01-03 (×3): 6 via TOPICAL

## 2023-12-31 MED ORDER — FLUTICASONE FUROATE-VILANTEROL 200-25 MCG/ACT IN AEPB
1.0000 | INHALATION_SPRAY | Freq: Every day | RESPIRATORY_TRACT | Status: DC
  Administered 2024-01-01 – 2024-01-03 (×3): 1 via RESPIRATORY_TRACT
  Filled 2023-12-31: qty 28

## 2023-12-31 MED ORDER — NITROGLYCERIN IN D5W 200-5 MCG/ML-% IV SOLN
0.0000 ug/min | INTRAVENOUS | Status: DC
  Administered 2023-12-31: 5 ug/min via INTRAVENOUS
  Administered 2024-01-01: 100 ug/min via INTRAVENOUS
  Filled 2023-12-31 (×2): qty 250

## 2023-12-31 MED ORDER — FUROSEMIDE 10 MG/ML IJ SOLN
40.0000 mg | Freq: Once | INTRAMUSCULAR | Status: AC
Start: 2023-12-31 — End: 2023-12-31
  Administered 2023-12-31: 40 mg via INTRAVENOUS
  Filled 2023-12-31: qty 4

## 2023-12-31 MED ORDER — METOPROLOL TARTRATE 25 MG PO TABS
25.0000 mg | ORAL_TABLET | Freq: Two times a day (BID) | ORAL | Status: DC
Start: 2023-12-31 — End: 2023-12-31
  Administered 2023-12-31: 25 mg via ORAL
  Filled 2023-12-31: qty 1

## 2023-12-31 MED ORDER — SIMVASTATIN 20 MG PO TABS
10.0000 mg | ORAL_TABLET | Freq: Every day | ORAL | Status: DC
  Administered 2023-12-31 – 2024-01-03 (×4): 10 mg via ORAL
  Filled 2023-12-31 (×4): qty 1

## 2023-12-31 MED ORDER — METOPROLOL SUCCINATE ER 25 MG PO TB24
25.0000 mg | ORAL_TABLET | Freq: Two times a day (BID) | ORAL | Status: DC
  Administered 2023-12-31 – 2024-01-03 (×7): 25 mg via ORAL
  Filled 2023-12-31 (×7): qty 1

## 2023-12-31 MED ORDER — POTASSIUM CHLORIDE CRYS ER 20 MEQ PO TBCR
40.0000 meq | EXTENDED_RELEASE_TABLET | Freq: Once | ORAL | Status: AC
Start: 2023-12-31 — End: 2023-12-31
  Administered 2023-12-31: 40 meq via ORAL
  Filled 2023-12-31: qty 2

## 2023-12-31 MED ORDER — ONDANSETRON HCL 4 MG/2ML IJ SOLN: 4.0000 mg | Freq: Four times a day (QID) | INTRAMUSCULAR | Status: DC | PRN

## 2023-12-31 MED ORDER — ALPRAZOLAM 0.5 MG PO TABS
0.5000 mg | ORAL_TABLET | Freq: Three times a day (TID) | ORAL | Status: DC | PRN
  Administered 2023-12-31: 0.5 mg via ORAL
  Filled 2023-12-31 (×3): qty 1

## 2023-12-31 MED ORDER — IPRATROPIUM-ALBUTEROL 0.5-2.5 (3) MG/3ML IN SOLN
3.0000 mL | Freq: Four times a day (QID) | RESPIRATORY_TRACT | Status: DC | PRN
  Administered 2024-01-02: 3 mL via RESPIRATORY_TRACT
  Filled 2023-12-31: qty 3

## 2023-12-31 MED ORDER — MIRTAZAPINE 15 MG PO TABS
7.5000 mg | ORAL_TABLET | Freq: Every day | ORAL | Status: DC
  Administered 2023-12-31 – 2024-01-02 (×3): 7.5 mg via ORAL
  Filled 2023-12-31 (×3): qty 1

## 2023-12-31 MED ORDER — NITROGLYCERIN 2 % TD OINT
1.0000 [in_us] | TOPICAL_OINTMENT | Freq: Once | TRANSDERMAL | Status: AC
  Administered 2023-12-31: 1 [in_us] via TOPICAL
  Filled 2023-12-31: qty 1

## 2023-12-31 MED ORDER — APIXABAN 5 MG PO TABS
5.0000 mg | ORAL_TABLET | Freq: Two times a day (BID) | ORAL | Status: DC
  Administered 2023-12-31 – 2024-01-03 (×7): 5 mg via ORAL
  Filled 2023-12-31 (×7): qty 1

## 2023-12-31 MED ORDER — ACETAMINOPHEN 650 MG RE SUPP: 650.0000 mg | Freq: Four times a day (QID) | RECTAL | Status: DC | PRN

## 2023-12-31 MED ORDER — DONEPEZIL HCL 5 MG PO TABS
5.0000 mg | ORAL_TABLET | Freq: Every day | ORAL | Status: DC
Start: 2023-12-31 — End: 2024-01-03
  Administered 2023-12-31 – 2024-01-03 (×4): 5 mg via ORAL
  Filled 2023-12-31 (×4): qty 1

## 2023-12-31 MED ORDER — SACUBITRIL-VALSARTAN 24-26 MG PO TABS
1.0000 | ORAL_TABLET | Freq: Two times a day (BID) | ORAL | Status: DC
  Administered 2023-12-31 – 2024-01-02 (×5): 1 via ORAL
  Filled 2023-12-31 (×5): qty 1

## 2023-12-31 MED ORDER — IPRATROPIUM-ALBUTEROL 0.5-2.5 (3) MG/3ML IN SOLN
3.0000 mL | Freq: Once | RESPIRATORY_TRACT | Status: AC
  Administered 2023-12-31: 3 mL via RESPIRATORY_TRACT
  Filled 2023-12-31: qty 3

## 2023-12-31 MED ORDER — TAMSULOSIN HCL 0.4 MG PO CAPS
0.4000 mg | ORAL_CAPSULE | Freq: Every day | ORAL | Status: DC
Start: 2023-12-31 — End: 2024-01-03
  Administered 2023-12-31 – 2024-01-03 (×4): 0.4 mg via ORAL
  Filled 2023-12-31 (×4): qty 1

## 2023-12-31 MED ORDER — FUROSEMIDE 10 MG/ML IJ SOLN
40.0000 mg | Freq: Two times a day (BID) | INTRAMUSCULAR | Status: AC
  Administered 2023-12-31 – 2024-01-01 (×3): 40 mg via INTRAVENOUS
  Filled 2023-12-31 (×3): qty 4

## 2023-12-31 MED ORDER — ONDANSETRON HCL 4 MG PO TABS: 4.0000 mg | ORAL_TABLET | Freq: Four times a day (QID) | ORAL | Status: DC | PRN

## 2023-12-31 NOTE — ED Triage Notes (Signed)
 Pt bib pov w/ c/o sob x 2 days. Pt reports a hx of CHF and COPD. PT reports he is compliant with all medications. Pt denies any recent virus or infection.

## 2023-12-31 NOTE — ED Notes (Signed)
nitroglycerin paste removed.

## 2023-12-31 NOTE — ED Notes (Signed)
 Meds delayed due to IV availability.

## 2023-12-31 NOTE — ED Provider Notes (Signed)
  EMERGENCY DEPARTMENT AT [facility] Provider Note   CSN: [id number] Arrival date & time: 12/31/23  9181     Patient presents with: Shortness of Breath   Derrick Bender is a 78 y.o. male.   Patient is a 78 year old male who presents to the emergency department with a chief complaint of shortness of breath which has been present for approximate the past 2 days.  He notes that he has had increased dyspnea with lying flat.  He does admit to some increased edema in bilateral lower extremities.  He has been compliant with all home medications.  He does have a history of CHF and COPD.  He denies any associated dizziness, lightheadedness or syncope.  He has had no associated chest pain.  He denies any abdominal pain, nausea, vomiting, diarrhea.   Shortness of Breath      Prior to Admission medications   Medication Sig Start Date End Date Taking? Authorizing Provider  albuterol  (PROAIR  HFA) 108 (90 Base) MCG/ACT inhaler Inhale 2 puffs into the lungs every 6 (six) hours as needed.    [provider]  ALPRAZolam  (XANAX ) 0.25 MG tablet Take 0.25 mg by mouth 2 (two) times daily as needed. 08/16/21   [provider]  apixaban  (ELIQUIS ) 5 MG TABS tablet Take 5 mg by mouth 2 (two) times daily.    [provider]  budesonide-formoterol  (SYMBICORT) 160-4.5 MCG/ACT inhaler Inhale 2 puffs into the lungs daily.    [provider]  donepezil  (ARICEPT ) 5 MG tablet Take 5 mg by mouth daily. Patient not taking: Reported on 12/13/2023 08/31/21   [provider]  ENTRESTO  24-26 MG Take 1 tablet by mouth 2 (two) times daily. Patient not taking: Reported on 12/13/2023 09/16/23   [provider]  furosemide  (LASIX ) 20 MG tablet TAKE 1 TABLET BY MOUTH EVERY DAY 01/03/23   Alvan Dorn FALCON, MD  loperamide  (IMODIUM  A-D) 2 MG tablet Take 1 tablet (2 mg total) by mouth 4 (four) times daily as needed for diarrhea or loose stools. 12/22/18   Singh,  Prashant K, MD  Melatonin 10 MG TABS Take 10 mg by mouth at bedtime.    [provider]  metoprolol  succinate (TOPROL -XL) 25 MG 24 hr tablet Take 1 tablet (25 mg total) by mouth in the morning and at bedtime. 12/06/23   Miriam Norris, NP  mirtazapine  (REMERON ) 7.5 MG tablet Take 7.5 mg by mouth at bedtime. 08/30/21   [provider]  oxybutynin  (DITROPAN ) 5 MG tablet Take 1 tablet by mouth 3 (three) times daily. 01/28/15   [provider]  tamsulosin  (FLOMAX ) 0.4 MG CAPS capsule Take 0.4 mg by mouth daily.    [provider]  topiramate  (TOPAMAX ) 100 MG tablet Take 100 mg by mouth 2 (two) times daily. Patient not taking: Reported on 12/13/2023    [provider]    Allergies: Sulfamethoxazole    Review of Systems  Respiratory:  Positive for shortness of breath.   All other systems reviewed and are negative.   Updated Vital Signs BP (!) 170/88   Pulse 65   Temp 98 F (36.7 C) (Oral)   Resp (!) 28   Ht 5' 9 (1.753 m)   Wt 88.9 kg   SpO2 95%   BMI 28.94 kg/m   Physical Exam Vitals and nursing note reviewed.  Constitutional:      General: He is not in acute distress.    Appearance: Normal appearance. He is not ill-appearing.  HENT:     Head: Normocephalic and atraumatic.     Nose: Nose normal.     Mouth/Throat:     Mouth: Mucous membranes are moist.  Eyes:     Extraocular Movements: Extraocular movements intact.     Conjunctiva/sclera: Conjunctivae normal.     Pupils: Pupils are equal, round, and reactive to light.  Cardiovascular:     Rate and Rhythm: Normal rate and regular rhythm.     Pulses: Normal pulses.     Heart sounds: Normal heart sounds. No murmur heard. Pulmonary:     Effort: Pulmonary effort is normal. Tachypnea present.     Breath sounds: Wheezing and rales present. No decreased breath sounds or rhonchi.  Chest:     Chest wall: No tenderness.  Abdominal:     General: Abdomen is flat. Bowel sounds are normal.      Palpations: Abdomen is soft.     Tenderness: There is no abdominal tenderness. There is no guarding.  Musculoskeletal:        General: Normal range of motion.     Cervical back: Normal range of motion and neck supple.     Right lower leg: Edema present.     Left lower leg: Edema present.  Skin:    General: Skin is warm and dry.     Findings: No erythema or rash.  Neurological:     General: No focal deficit present.     Mental Status: He is alert and oriented to person, place, and time. Mental status is at baseline.  Psychiatric:        Mood and Affect: Mood normal.        Behavior: Behavior normal.        Thought Content: Thought content normal.        Judgment: Judgment normal.     (all labs ordered are listed, but only abnormal results are displayed) Labs Reviewed  RESP PANEL BY RT-PCR (RSV, FLU A&B, COVID)  [id number]  BRAIN NATRIURETIC PEPTIDE  COMPREHENSIVE METABOLIC PANEL WITH GFR  CBC WITH DIFFERENTIAL/PLATELET  URINALYSIS, ROUTINE W REFLEX MICROSCOPIC  PROTIME-INR  CBG MONITORING, ED  TROPONIN I (HIGH SENSITIVITY)    EKG: None  Radiology: No results found.   .Critical Care  Performed by: Daralene Lonni BIRCH, PA-C Authorized by: Daralene Lonni BIRCH, PA-C   Critical care provider statement:    Critical care time (minutes):  35   Critical care was necessary to treat or prevent imminent or life-threatening deterioration of the following conditions: Acute CHF and hypertensive emergency.   Critical care was time spent personally by me on the following activities:  Development of treatment plan with patient or surrogate, discussions with consultants, evaluation of patient's response to treatment, examination of patient, ordering and review of laboratory studies, ordering and review of radiographic studies, ordering and performing treatments and interventions, pulse oximetry, re-evaluation of patient's condition and review of old charts   I assumed direction of  critical care for this patient from another provider in my specialty: no     Care discussed with: admitting provider      Medications Ordered in the ED  ipratropium-albuterol  (DUONEB) 0.5-2.5 (3) MG/3ML nebulizer solution 3 mL (has no administration in time range)  furosemide  (LASIX ) injection 40 mg (has no administration in time range)  Medical Decision Making Amount and/or Complexity of Data Reviewed Labs: ordered. Radiology: ordered.  Risk Prescription drug management. Decision regarding hospitalization.   This patient presents to the ED for concern of dyspnea differential diagnosis includes ACS, CHF, pulmonary embolus, pericarditis, myocarditis, endocarditis, aortic aneurysm or dissection, pneumonia, pneumothorax, hemothorax, acute viral syndrome    Additional history obtained:  Additional history obtained from medical records External records from outside source obtained and reviewed including medical records   Lab Tests:  I Ordered, and personally interpreted labs.  The pertinent results include: No leukocytosis, anemia at baseline, mild hyponatremia, normal creatinine, normal liver function, elevated BNP, normal troponin, negative viral swab   Imaging Studies ordered:  I ordered imaging studies including chest x-ray I independently visualized and interpreted imaging which showed vascular congestion I agree with the radiologist interpretation   Medicines ordered and prescription drug management:  I ordered medication including Lasix , nitroglycerin , DuoNeb for acute CHF, COPD Reevaluation of the patient after these medicines showed that the patient improved I have reviewed the patients home medicines and have made adjustments as needed   Problem List / ED Course:  Patient is doing well at this time and does remain stable.  Did discuss patient case with Dr. Alvan with cardiology who was in agreement to starting the patient on  nitroglycerin  drip, Lasix  and admission to the hospital service.  Cardiology will see the patient in consult.  Patient does have stable vital signs otherwise at this point he is not requiring any extra oxygen  support.  Blood work demonstrates no indication for acute kidney injury.  BNP is elevated.  Reviewing previous charts he does have a history of an ejection fraction which is 25 to 30%.  Patient has negative viral swab at this point.  Chest x-ray did demonstrate vascular congestion.  Low suspicion for pneumonia at this point.  Have discussed patient case with Dr. Ricky with the hospitalist service who has excepted for admission.   Social Determinants of Health:  None        Final diagnoses:  None    ED Discharge Orders     None          Daralene Lonni JONETTA DEVONNA 12/31/23 1107    Charlyn Sora, MD 12/31/23 1450

## 2023-12-31 NOTE — Consult Note (Addendum)
 Cardiology Consultation   Patient ID: Derrick Bender MRN: [id number]; DOB: April 14, 1946  Admit date: 12/31/2023 Date of Consult: 12/31/2023  PCP:  Derrick Leta NOVAK, MD   [facility] Providers Cardiologist:  Alvan Carrier, MD      Patient Profile: Derrick Bender is a 78 y.o. male with a hx of HTN, HLD, OSA, chronic HFrEF (EF in 2017 65 to 60%, in 2023 60 to 65%; Most recent EF 25-30% in 08/2023 at [facility]), paroxysmal atrial fibrillation (Dx at [facility] 08/2023 with no A-fib detected on ZIO monitor in 09/2023), hx of DVT in 02/2023 (On Eliquis ), Hx of TIA, COPD and leg edema who is being seen 12/31/2023 for the evaluation of Acute on chronic HFrEF and HTN emergency with pulmonary edema at the request of Dr. Charlyn.  History of Present Illness: Derrick Bender last seen in heart care 12/06/2023 by Almarie Crate, NP for follow-up.  Reported DOE, unintentional weight loss, palpitations and issues with CPAP machine.  Discontinued Jardiance due to weight loss with plans to revisit at next OV.  Increase Toprol -XL to 25 mg twice daily.  Continue Lasix  20 mg daily, Entresto  24/26 twice daily, and Eliquis  but unsure of dose. Ordered proBNP, BMP, and mag, which was not completed.  Follow up nurse visit  8/28 confirmed Eliquis  5 mg twice daily and EKG showed Sinus bradycardia, HR 54, inverted T waves in lateral leads, biphasic T waves in V1/V2.  Also referred to pulmonary for further evaluation of CPAP machine.  Patient presented to [facility] ED today for SOB.  Normal labs include K3.5, CR 1.24, troponin 13, WBC, respiratory viral panel Abnormal labs include albumin 3.2, BNP 1757, stable chronic anemia with Hgb 11.5 EKG: NSR, HR 62, nonspecific T wave changes with wandering baseline CXR suggestive of mild vascular congestion ECHO pending.  Treated with Lasix  IV 40 mg X1, NTG paste and IV.   On interview, reported orthopnea X 2 days and having to sleep on couch sitting up to resolve SOB.  Reports SOB with  minimal exertion that is resolved with rest.  Patient noted that he just started back walking recently due to recovering from back surgery.  Reports weight gain of 188 lbs to 196 lbs over the past 3 months but not able to comment on recent weight changes.  Denies any edema.  Reports ongoing palpitations that improved with increasing metoprolol  that occurs when stressed by his wife with dementia lasting less than 5 minutes about 1-2 times per day.  Denies any chest pain, dizziness, syncope.  Patient stopped PT about 1 month ago and was able to complete without any chest pain or SOB at that time.  Patient uses a walker to ambulate around the home.  Patient uses sea salt, however notes overall low sodium diet.  Reports daily medication compliance.  He has not been scheduled to follow-up with pulmonary for CPAP.  Currently, patient feels much better without any SOB.  Notes significant increase in urine output with IV Lasix .  Past Medical History:  Diagnosis Date   Asthma    COPD (chronic obstructive pulmonary disease) (HCC)    CVA (cerebral vascular accident) (HCC) 01/2011   [facility]   Depression    Diabetes (HCC)    Dysarthria    Hyperlipidemia    Hypertension    OSA (obstructive sleep apnea)    Prostate cancer (HCC)    Dr. Beola   Seizure [facility])    TIA (transient ischemic attack)     Past Surgical History:  Procedure Laterality Date   XI ROBOTIC ASSISTED SIMPLE PROSTATECTOMY  03/2013   Dr. Tommi     Home Medications:  Prior to Admission medications   Medication Sig Start Date End Date Taking? Authorizing Provider  ALPRAZolam  (XANAX ) 1 MG tablet Take 1 mg by mouth at bedtime. 12/21/23  Yes [provider]  ciprofloxacin (CIPRO) 500 MG tablet Take 500 mg by mouth 2 (two) times daily. 12/21/23  Yes [provider]  predniSONE  (DELTASONE ) 10 MG tablet Take 10 mg by mouth 2 (two) times daily. 12/21/23  Yes [provider]  simvastatin  (ZOCOR ) 10 MG tablet Take 10 mg by mouth  daily. 12/20/23  Yes [provider]  albuterol  (PROAIR  HFA) 108 (90 Base) MCG/ACT inhaler Inhale 2 puffs into the lungs every 6 (six) hours as needed.    [provider]  ALPRAZolam  (XANAX ) 0.25 MG tablet Take 0.25 mg by mouth 2 (two) times daily as needed. 08/16/21   [provider]  apixaban  (ELIQUIS ) 5 MG TABS tablet Take 5 mg by mouth 2 (two) times daily.    [provider]  budesonide-formoterol  (SYMBICORT) 160-4.5 MCG/ACT inhaler Inhale 2 puffs into the lungs daily.    [provider]  donepezil  (ARICEPT ) 5 MG tablet Take 5 mg by mouth daily. Patient not taking: Reported on 12/13/2023 08/31/21   [provider]  ENTRESTO  24-26 MG Take 1 tablet by mouth 2 (two) times daily. Patient not taking: Reported on 12/13/2023 09/16/23   [provider]  furosemide  (LASIX ) 20 MG tablet TAKE 1 TABLET BY MOUTH EVERY DAY 01/03/23   Alvan Dorn FALCON, MD  loperamide  (IMODIUM  A-D) 2 MG tablet Take 1 tablet (2 mg total) by mouth 4 (four) times daily as needed for diarrhea or loose stools. 12/22/18   Singh, Prashant K, MD  Melatonin 10 MG TABS Take 10 mg by mouth at bedtime.    [provider]  metoprolol  succinate (TOPROL -XL) 25 MG 24 hr tablet Take 1 tablet (25 mg total) by mouth in the morning and at bedtime. 12/06/23   Miriam Norris, NP  mirtazapine  (REMERON ) 7.5 MG tablet Take 7.5 mg by mouth at bedtime. 08/30/21   [provider]  oxybutynin  (DITROPAN ) 5 MG tablet Take 1 tablet by mouth 3 (three) times daily. 01/28/15   [provider]  tamsulosin  (FLOMAX ) 0.4 MG CAPS capsule Take 0.4 mg by mouth daily.    [provider]  topiramate  (TOPAMAX ) 100 MG tablet Take 100 mg by mouth 2 (two) times daily. Patient not taking: Reported on 12/13/2023    [provider]    Scheduled Meds:  apixaban   5 mg Oral BID   donepezil   5 mg Oral Daily   fluticasone  furoate-vilanterol  1 puff Inhalation Daily   furosemide   40  mg Intravenous Q12H   metoprolol  tartrate  25 mg Oral BID   mirtazapine   7.5 mg Oral QHS   sacubitril -valsartan   1 tablet Oral BID   simvastatin   10 mg Oral Daily   tamsulosin   0.4 mg Oral Daily   Continuous Infusions:  nitroGLYCERIN  15 mcg/min (12/31/23 1221)   PRN Meds: acetaminophen  **OR** acetaminophen , ALPRAZolam , ipratropium-albuterol , ondansetron  **OR** ondansetron  (ZOFRAN ) IV  Allergies:    Allergies  Allergen Reactions   Sulfamethoxazole Dermatitis    Social History:   Social History   Socioeconomic History   Marital status: Married    Spouse name: Not on file   Number of children: Not on file   Years of education: Not  on file   Highest education level: Not on file  Occupational History   Not on file  Tobacco Use   Smoking status: Former    Current packs/day: 0.00    Average packs/day: 0.5 packs/day for 37.0 years (18.5 ttl pk-yrs)    Types: Cigarettes    Start date: 01/16/1960    Quit date: 04/18/1995    Years since quitting: 28.7    Passive exposure: Never   Smokeless tobacco: Never  Substance and Sexual Activity   Alcohol  use: Not on file   Drug use: Not on file   Sexual activity: Not on file  Other Topics Concern   Not on file  Social History Narrative   Not on file   Family History:   Family History  Problem Relation Age of Onset   Bone cancer Mother    Pancreatic cancer Father      ROS:  Please see the history of present illness.  All other ROS reviewed and negative.     Physical Exam/Data: Vitals:   12/31/23 0957 12/31/23 1020 12/31/23 1030 12/31/23 1045  BP:  (!) 200/92 (!) 214/99 (!) 230/101  Pulse:  67 68 67  Resp:  (!) 24 (!) 26 (!) 21  Temp:      TempSrc:      SpO2: 94% 97% 96% 92%  Weight:      Height:        Intake/Output Summary (Last 24 hours) at 12/31/2023 1248 Last data filed at 12/31/2023 1206 Gross per 24 hour  Intake 100 ml  Output 450 ml  Net -350 ml      12/31/2023    8:32 AM 12/13/2023    8:22 AM 12/06/2023     3:15 PM  Last 3 Weights  Weight (lbs) 196 lb 192 lb 12.8 oz 188 lb 9.6 oz  Weight (kg) 88.905 kg 87.454 kg 85.548 kg     Body mass index is 28.94 kg/m.  General:  Well nourished, well developed, in no acute distress HEENT: normal Neck: no JVD Vascular: No carotid bruits; Distal pulses 2+ bilaterally Cardiac:  normal S1, S2; RRR; no murmur  Lungs:  diminished breath sounds in lung bases.  Abd: soft, nontender, no hepatomegaly  Ext: no edema Musculoskeletal:  No deformities, BUE and BLE strength normal and equal Skin: warm and dry  Neuro:  CNs 2-12 intact, no focal abnormalities noted Psych:  Normal affect   EKG:  The EKG was personally reviewed and demonstrates:  NSR, HR 62, nonspecific T wave changes with wandering baseline  Telemetry:  Telemetry was personally reviewed and demonstrates:  Normal sinus rhythm, HR 60-70, with episodes of NSVT up to 4 beats, occasional PVCs, and bigeminy PVC  Relevant CV Studies: ECHO pending   Lexiscan  12/2015 No diagnostic ST segment changes to indicate ischemia. Occasional PVCs. Small, mild intensity, inferior defect that is fixed and most prominent at rest, consistent with soft tissue attenuation. No large ischemic territories. This is a low risk study. Nuclear stress EF: 65%. Stress test look good, overall the testing on his heart has looked good. No limitations on pursuing his surgery from heart standpoint, I defer his risk evaluation regarding his lungs to his pcp. Please forward my addended clinic note to his surgeon. He should see us  back in 6 months   ECHO 09/2021 IMPRESSIONS   1. Left ventricular ejection fraction, by estimation, is 60 to 65%. The  left ventricle has normal function. The left ventricle has no regional  wall motion  abnormalities. Left ventricular diastolic parameters are  consistent with Grade I diastolic  dysfunction (impaired relaxation).   2. Right ventricular systolic function is normal. The right ventricular  size  is normal. Tricuspid regurgitation signal is inadequate for assessing  PA pressure.   3. The mitral valve is abnormal. Mild mitral valve regurgitation. No  evidence of mitral stenosis.   4. The tricuspid valve is abnormal.   5. The aortic valve is tricuspid. There is mild calcification of the  aortic valve. There is mild thickening of the aortic valve. Aortic valve  regurgitation is mild to moderate. No aortic stenosis is present.   Comparison(s): Echocardiogram done 12/08/15 showed an EF of 65-70%.   Zio monitor [facility]) 09/2023:  Conclusions:  - Ambulatory ECG monitoring was performed from 09/16/23 to 09/30/23.  - The predominant rhythm was sinus rhythm, with the rate ranging from 42  to 117 and averaging 71 bpm when in sinus rhythm  - Occasional supraventricular ectopics (PACs) were recorded (2.0% burden)  with 24 episodes of SVT, longest lasting 16 beats at 125 bpm  - Occasional ventricular ectopics (PVCs) were recorded (1.8% burden) with  4003 episodes of wide-complex tachycardia, longest lasting 15.4 seconds at  137 bpm  - No patient-initiated recordings/events were submitted.  - No pauses >3 seconds or high-grade AV block detected  - No atrial fibrillation detected   Echocardiogram in May 2025 with [facility]: Summary   1. The left ventricle is upper normal in size with normal wall thickness.    2. The left ventricular systolic function is severely decreased, LVEF is  visually estimated at 25-30%.    3. There is mild aortic regurgitation.    4. The right ventricle is not well visualized but probably normal in size,  with probably normal systolic function.    5. There is moderate pulmonary hypertension.   Laboratory Data: High Sensitivity Troponin:   Recent Labs  Lab 12/31/23 0853 12/31/23 1048  TROPONINIHS 13 9     Chemistry Recent Labs  Lab 12/31/23 0853 12/31/23 1048  NA 148*  --   K 3.5  --   CL 109  --   CO2 28  --   GLUCOSE 84  --   BUN 25*  --   CREATININE  1.24  --   CALCIUM  8.3*  --   MG  --  2.1  GFRNONAA 60*  --   ANIONGAP 11  --     Recent Labs  Lab 12/31/23 0853  PROT 6.1*  ALBUMIN 3.2*  AST 14*  ALT 11  ALKPHOS 71  BILITOT 0.8   Lipids No results for input(s): CHOL, TRIG, HDL, LABVLDL, LDLCALC, CHOLHDL in the last 168 hours.  Hematology Recent Labs  Lab 12/31/23 0853  WBC 8.8  RBC 4.79  HGB 11.5*  HCT 39.4  MCV 82.3  MCH 24.0*  MCHC 29.2*  RDW 23.9*  PLT 226   Thyroid   Recent Labs  Lab 12/31/23 1048  TSH 0.964    BNP Recent Labs  Lab 12/31/23 0853  BNP 1,757.0*    DDimer No results for input(s): DDIMER in the last 168 hours.  Radiology/Studies:  DG Chest Port 1 View Result Date: 12/31/2023 CLINICAL DATA:  Dyspnea. EXAM: PORTABLE CHEST 1 VIEW COMPARISON:  04/19/2023 FINDINGS: Cardiomediastinal silhouette is upper limits of normal but stable. Mildly prominent interstitial and vascular markings. Slightly increased densities in the mid and lower left chest. Difficult to exclude mild blunting at the right costophrenic angle. Negative for  a pneumothorax. No acute bone abnormality. IMPRESSION: 1. Mildly prominent interstitial and vascular markings. Findings could represent mild vascular congestion. 2. Slightly increased densities in the left lower lung are nonspecific. Developing infection cannot be excluded. Electronically Signed   By: Juliene Balder M.D.   On: 12/31/2023 09:10    Assessment and Plan: Acute on Chronic HFrEF  Pulmonary HTN  EF in 2017 65 to 60%, in 2023 60 to 65% 2017 Lexiscan  showed low risk study with EF of 65%. ECHO 08/20[facility] at [facility]: EF 25 to 30%, mild AR, right ventricular was felt to be probably normal systolic function with moderate pulmonary hypertension and mild aortic regurgitation. BNP 1757. CXR suggestive of mild vascular congestion. Echo pending. If Echo still reduced would consider further ischemic evaluation.  8 lb weight gain; 188 lb on 12/06/2023, now weight 196 lbs.   Reports orthopnea X 2 days, SOB with minimal exertion resolves at rest.  Denies any edema.  Currently, patient reports improvement in symptoms.  Notes significant increase in urine output with IV Lasix . Treated with IV Lasix  40 mg x2. I/O -350.  Continue IV Lasix  40 mg twice daily.  No visible signs of volume overload on exam but diminished breath sounds bilaterally in bases. Continue Entresto  24/26 mg twice daily. Scheduled to start beta-blocker as below Does not appear that patient has been on spironolactone .  Would consider starting low dose spiro this admission once BP more stable.  Previously on Jardiance but discontinued last OV as above due to unexplained weight loss.  Hypertensive emergency  HTN BP up to 230/101. BP while in room improved to 168/73.  Home meds: Toprol  XL 25 mg BID, Lasix  20 mg daily  Treated with nitroglycerin  paste and IV.  Scheduled to start Lopressor  25 mg BID but previously on Toprol  XL 25 mg BID. Recommend starting Toprol  home med instead.  In setting of HTN emergency, proper BP goal is a controlled, gradual reduction in blood pressure, typically by no more than 25% in the first hour, then to 160/100 mmHg in the next 2-6 hours, and finally normalized over 24-48 hours  Paroxysmal atrial fibrillation  NSVT  PVC  Dx at [facility] 08/2023 with no A-fib detected on ZIO monitor in 09/2023 Zio 09/2023 at [facility] showed predominantly in sinus rhythm with average heart rate of 71 bpm. He did have occasional supraventricular ectopic beats, PACs with 2% burden, 24 episodes of SVT, longest lasting 16 beats at 125 bpm. And occasional ventricular ectopies around 2% burden. It was noted he had multiple episodes of wide-complex tachycardia-see full report below-longest episode lasted 15 seconds at 137 bpm.  Reports ongoing palpitations that improved with increasing metoprolol  that occurs when stressed by his wife with dementia lasting less than 5 minutes about 1-2 times per day.   Tele:  Normal sinus rhythm, HR 60-70, with episodes of NSVT up to 4 beats, occasional PVCs, and bigeminy PVC Continue on Eliquis  5 mg twice daily Scheduled to restart beta-blocker as above  Hx of DVT in 02/2023  Continue Eliquis  5 mg twice daily  OSA He has not been able to follow-up with pulmonary for CPAP reevaluation.  Risk Assessment/Risk Scores:  New York  Heart Association (NYHA) Functional Class NYHA Class III  CHA2DS2-VASc Score = 5   This indicates a 7.2% annual risk of stroke. The patient's score is based upon: CHF History: 1 HTN History: 1 Diabetes History: 0 Stroke History: 0 Vascular Disease History: 1 Age Score: 2 Gender Score: 0  For questions or updates, please contact [facility] Please consult [email or web address] for contact info under      Signed, Lorette CINDERELLA Kapur, PA-C  12/31/2023 12:48 PM   Attending Note   Patient seen and discussed with PA Kapur, I agree with her documentation. 78 yo male history of HTN, OSA, HLD, prior TIA, COPD, afib, prior DVT, chronic HFrEF LVEF 25-30%, presents with progressive SOB/DOE and orthopnea.    BNP 1757 K 3.5 BUN 25 Cr 1.24 WBC 8.8 Hgb 11.5 Plt 226  Trop 13-->9 EKG SR at 62 CXR vascular congestion   09/2021 echo: LVEF 60-65%, grade I dd, normal RV 08/2023 echo [facility]: LVEF 25-30%  Assessment and Plan   1.Acute on chronic HFrEF - 09/2021 echo: LVEF 60-65%, grade I dd, normal RV - 08/2023 echo [facility]: LVEF 25-30% - during May admission at [facility] newly diagnosed with HFrEF. STarted on toprol , entresto , jardiance.  - also diagnosed with afib during admission, from notes some thought may be tachy mediated  - CXR vascular congestion, BNP 1757 - presents volume overloaded in setting of severe HTN, SBP in the 200s - received IV lasix  40mg  x 2 in ER, due for additional 40mg  this evening. Follow I/Os and renal function - medical therapy with entrestro 24/26mg  bid, toprol  25mg  bid. Likely add MRA later in admission. From  note SGLT2i was stopped for unexplained weightl loss    2. HTN emergency - bp's 230s/100s in setting of pulmonary edema, acute on chronic HFrEF - started on NG drip. Initial MAP 130s, work to bring down gradually roughly 25% over 24 hours, goal would be around abound MAP of 95   3. Afib - diagnosed during 08/2023 admission at [facility] - EKG this admit shows NSR - has been on eliquis  for stroke preventin, toprol  for rate control    4. History of DVT - from [facility] DVT diagnosed Jan 2025 due to immobility - he is on eliquis    Dorn Ross MD

## 2023-12-31 NOTE — Progress Notes (Incomplete)
 Attending Note   Patient seen and discussed with PA Sheron, I agree with her documentation. 78 yo male history of HTN, OSA, HLD, prior TIA, COPD, afib, prior DVT, chronic HFrEF LVEF 25-30%, presents with progressive SOB/DOE and orthopnea.    BNP 1757 K 3.5 BUN 25 Cr 1.24 WBC 8.8 Hgb 11.5 Plt 226  Trop 13-->9 EKG SR at 62 CXR vascular congestion   09/2021 echo: LVEF 60-65%, grade I dd, normal RV 08/2023 echo [facility]: LVEF 25-30%  Assessment and Plan   1.Acute on chronic HFrEF - 09/2021 echo: LVEF 60-65%, grade I dd, normal RV - 08/2023 echo [facility]: LVEF 25-30% - during May admission at [facility] newly diagnosed with HFrEF. STarted on toprol , entresto , jardiance.  - also diagnosed with afib during admission, from notes some thought may be tachy mediated  - CXR vascular congestion, BNP 1757 - presents volume overloaded in setting of severe HTN, SBP in the 200s - received IV lasix  40mg  x 2 in ER, due for additional 40mg  this evening. Follow I/Os and renal function - medical therapy with entrestro 24/26mg  bid, toprol  25mg  bid. Likely add MRA and SGLT2i this admission.  - repeat echo    2. HTN emergency - bp's 230s/100s in setting of pulmonary edema, acute on chronic HFrEF - started on NG drip. Initial MAP 130s, work to bring down gradually roughly 25% over 24 hours, goal would be around abound MAP of 95   3. Afib - diagnosed during 08/2023 admission at [facility] - EKG this admit shows NSR - has been on eliquis  for stroke preventin, toprol  for rate control    4. History of DVT - from [facility] DVT diagnosed Jan 2025 due to immobility - he is on eliquis    Dorn Ross MD

## 2023-12-31 NOTE — H&P (Signed)
 History and Physical    Patient: Derrick Bender FMW:[id number] DOB: January 31, 1946 DOA: 12/31/2023 DOS: the patient was seen and examined on 12/31/2023 PCP: Rosamond Leta NOVAK, MD   Patient coming from: Home  Chief Complaint:  Chief Complaint  Patient presents with   Shortness of Breath   HPI: Derrick Bender is a 78 y.o. male with medical history significant of systolic heart failure, COPD/asthma, history of DVT on chronic Eliquis , depression, diabetes, hyperlipidemia, hypertension, obstructive sleep apnea and BPH; who presented to the hospital secondary to increased shortness of breath, orthopnea and dyspnea on exertion. Patient reported to have been present for the last 4-5 days and worsening.  He has present no fever, no chills, no chest pain, no sick contacts, no abdominal pain, no nausea/vomiting, no dysuria/hematuria or any complaints of melena or hematochezia.  Patient has noticed an increase with weight and also lower extremity swelling.  Workup in the ED demonstrating hypertensive urgency with systolic blood pressure above 799, increased BNP, chest x-ray with vascular congestion and interstitial edema.  Case discussed with cardiology service who recommended initiation of nitroglycerin  drip to assist with blood pressure control, IV diuresis, obtain 2D echo and follow clinical response.  TRH has been contacted to place patient in the hospital for further evaluation and management.   Review of Systems: As mentioned in the history of present illness. All other systems reviewed and are negative. Past Medical History:  Diagnosis Date   Asthma    COPD (chronic obstructive pulmonary disease) (HCC)    CVA (cerebral vascular accident) (HCC) 01/2011   [facility]   Depression    Diabetes (HCC)    Dysarthria    Hyperlipidemia    Hypertension    OSA (obstructive sleep apnea)    Prostate cancer (HCC)    Dr. Beola   Seizure [facility])    TIA (transient ischemic attack)    Past Surgical History:   Procedure Laterality Date   XI ROBOTIC ASSISTED SIMPLE PROSTATECTOMY  03/2013   Dr. Tommi   Social History:  reports that he quit smoking about 28 years ago. His smoking use included cigarettes. He started smoking about 64 years ago. He has a 18.5 pack-year smoking history. He has never been exposed to tobacco smoke. He has never used smokeless tobacco. No history on file for alcohol  use and drug use.  Allergies  Allergen Reactions   Sulfamethoxazole Dermatitis    Family History  Problem Relation Age of Onset   Bone cancer Mother    Pancreatic cancer Father     Prior to Admission medications   Medication Sig Start Date End Date Taking? Authorizing Provider  albuterol  (PROAIR  HFA) 108 (90 Base) MCG/ACT inhaler Inhale 2-3 puffs into the lungs every 6 (six) hours as needed.   Yes [provider]  ALPRAZolam  (XANAX ) 1 MG tablet Take 1 mg by mouth at bedtime. 12/21/23  Yes [provider]  apixaban  (ELIQUIS ) 5 MG TABS tablet Take 5 mg by mouth 2 (two) times daily.   Yes [provider]  budesonide-formoterol  (SYMBICORT) 160-4.5 MCG/ACT inhaler Inhale 2 puffs into the lungs daily.   Yes [provider]  donepezil  (ARICEPT ) 5 MG tablet Take 5 mg by mouth daily. 08/31/21  Yes [provider]  furosemide  (LASIX ) 20 MG tablet TAKE 1 TABLET BY MOUTH EVERY DAY 01/03/23  Yes Branch, Dorn FALCON, MD  loperamide  (IMODIUM  A-D) 2 MG tablet Take 1 tablet (2 mg total) by mouth 4 (four) times daily as needed for diarrhea  or loose stools. 12/22/18  Yes Singh, Prashant K, MD  Melatonin 10 MG TABS Take 10 mg by mouth at bedtime.   Yes [provider]  metoprolol  succinate (TOPROL -XL) 25 MG 24 hr tablet Take 1 tablet (25 mg total) by mouth in the morning and at bedtime. 12/06/23  Yes Miriam Norris, NP  mirtazapine  (REMERON ) 7.5 MG tablet Take 7.5 mg by mouth at bedtime. 08/30/21  Yes [provider]  oxybutynin  (DITROPAN ) 5 MG tablet Take 1 tablet by  mouth 3 (three) times daily. 01/28/15  Yes [provider]  predniSONE  (DELTASONE ) 10 MG tablet Take 10 mg by mouth 2 (two) times daily. 12/21/23  Yes [provider]  simvastatin  (ZOCOR ) 10 MG tablet Take 10 mg by mouth daily. 12/20/23  Yes [provider]  tamsulosin  (FLOMAX ) 0.4 MG CAPS capsule Take 0.4 mg by mouth daily.   Yes [provider]  ciprofloxacin (CIPRO) 500 MG tablet Take 500 mg by mouth 2 (two) times daily. Patient not taking: Reported on 12/31/2023 12/21/23   [provider]  topiramate  (TOPAMAX ) 100 MG tablet Take 100 mg by mouth 2 (two) times daily. Patient not taking: Reported on 12/31/2023    [provider]    Physical Exam: Vitals:   12/31/23 2030 12/31/23 2045 12/31/23 2100 12/31/23 2115  BP: (!) 157/94 (!) 174/64 (!) 182/93 (!) 197/89  Pulse: (!) 32 63 64 64  Resp: (!) 22 (!) 21 (!) 30 (!) 23  Temp:      TempSrc:      SpO2: 94% 94% 97% 95%  Weight:      Height:       General exam: Alert, awake, oriented x 3; no fever, denying chest pain and complaining of orthopnea. Respiratory system: Decreased breath sounds at the bases; positive scattered rhonchi.  No using accessory muscle. Cardiovascular system:RRR.  No rubs, no gallops, no murmur. Gastrointestinal system: Abdomen is nondistended, soft and nontender.  Positive bowel sounds.  Mild increase abdominal girth appreciated on exam. Central nervous system:  No focal neurological deficits. Extremities: No cyanosis or clubbing; trace to 1+ edema appreciated bilaterally. Skin: No petechiae. Psychiatry: Judgement and insight appear normal. Mood & affect appropriate.   Data Reviewed: AWE:8,242 Comprehensive metabolic panel: Sodium 148, potassium 3.5, chloride 109, bicarb 28, BUN 25, creatinine 1.24, normal LFTs and GFR 60 CBC: WBCs 8.8, hemoglobin 11.5 and platelet count 226K High sensitive troponin: 13 >>9  Assessment and Plan: 1-acute on chronic systolic heart  failure - Daily weights, strict intake and output and low-sodium diet has been started - IV diuresis initiated - Update 2D echo - Cardiology service has been consulted and will follow further recommendation - Close monitoring of patient's electrolytes and renal function will be followed. - Continue treatment with metoprolol  and Entresto   2-history of COPD/asthma - No wheezing or signs of acute exacerbation - Continue home bronchodilator management  3-hypertension- - Resuming home antihypertensive agents (Entresto , Flomax  and also IV Lasix  - Follow vital signs - Patient has been started on nitroglycerin  drip and will be wean off as tolerated.  4-history of BPH - Continue Flomax   5-history of DVT - Continue Eliquis   6-depression/anxiety - Continue treatment with Remeron  and as needed Xanax   7-hyperlipidemia - Continue Zocor   8-history of obstructive sleep apnea -Continue CPAP nightly  9-history of cognitive impairment - Constant reorientation - Resume home Aricept     Advance Care Planning:   Code Status: Full Code   Consults: Cardiology service  Family Communication: No  family at bedside  Severity of Illness: The appropriate patient status for this patient is INPATIENT. Inpatient status is judged to be reasonable and necessary in order to provide the required intensity of service to ensure the patient's safety. The patient's presenting symptoms, physical exam findings, and initial radiographic and laboratory data in the context of their chronic comorbidities is felt to place them at high risk for further clinical deterioration. Furthermore, it is not anticipated that the patient will be medically stable for discharge from the hospital within 2 midnights of admission.   * I certify that at the point of admission it is my clinical judgment that the patient will require inpatient hospital care spanning beyond 2 midnights from the point of admission due to high intensity of  service, high risk for further deterioration and high frequency of surveillance required.*  Author: Eric Nunnery, MD 12/31/2023 9:44 PM  For on call review [email or web address].

## 2024-01-01 ENCOUNTER — Other Ambulatory Visit (HOSPITAL_COMMUNITY): Payer: Self-pay | Admitting: *Deleted

## 2024-01-01 ENCOUNTER — Inpatient Hospital Stay (HOSPITAL_COMMUNITY)

## 2024-01-01 DIAGNOSIS — G4733 Obstructive sleep apnea (adult) (pediatric): Secondary | ICD-10-CM

## 2024-01-01 DIAGNOSIS — R0609 Other forms of dyspnea: Secondary | ICD-10-CM

## 2024-01-01 DIAGNOSIS — I5023 Acute on chronic systolic (congestive) heart failure: Secondary | ICD-10-CM | POA: Diagnosis not present

## 2024-01-01 DIAGNOSIS — I161 Hypertensive emergency: Secondary | ICD-10-CM | POA: Diagnosis not present

## 2024-01-01 LAB — ECHOCARDIOGRAM COMPLETE
AR max vel: 1.72 cm2
AV Area VTI: 1.74 cm2
AV Area mean vel: 1.69 cm2
AV Mean grad: 7 mmHg
AV Peak grad: 13 mmHg
Ao pk vel: 1.8 m/s
Area-P 1/2: 2.95 cm2
Height: 69 in
P 1/2 time: 895 ms
S' Lateral: 3.9 cm
Single Plane A4C EF: 38.4 %
Weight: 2934.76 [oz_av]

## 2024-01-01 LAB — CBC
HCT: 45.1 % (ref 39.0–52.0)
Hemoglobin: 13.1 g/dL (ref 13.0–17.0)
MCH: 23.6 pg — ABNORMAL LOW (ref 26.0–34.0)
MCHC: 29 g/dL — ABNORMAL LOW (ref 30.0–36.0)
MCV: 81.1 fL (ref 80.0–100.0)
Platelets: 254 K/uL (ref 150–400)
RBC: 5.56 MIL/uL (ref 4.22–5.81)
RDW: 24 % — ABNORMAL HIGH (ref 11.5–15.5)
WBC: 8.4 K/uL (ref 4.0–10.5)
nRBC: 0 % (ref 0.0–0.2)

## 2024-01-01 LAB — BASIC METABOLIC PANEL WITH GFR
Anion gap: 13 (ref 5–15)
BUN: 25 mg/dL — ABNORMAL HIGH (ref 8–23)
CO2: 31 mmol/L (ref 22–32)
Calcium: 8 mg/dL — ABNORMAL LOW (ref 8.9–10.3)
Chloride: 103 mmol/L (ref 98–111)
Creatinine, Ser: 1.31 mg/dL — ABNORMAL HIGH (ref 0.61–1.24)
GFR, Estimated: 56 mL/min — ABNORMAL LOW (ref >60)
Glucose, Bld: 125 mg/dL — ABNORMAL HIGH (ref 70–99)
Potassium: 3.1 mmol/L — ABNORMAL LOW (ref 3.5–5.1)
Sodium: 147 mmol/L — ABNORMAL HIGH (ref 135–145)

## 2024-01-01 LAB — HEMOGLOBIN A1C
Hgb A1c MFr Bld: 5.1 % (ref 4.8–5.6)
Mean Plasma Glucose: 99.67 mg/dL

## 2024-01-01 MED ORDER — PERFLUTREN LIPID MICROSPHERE
1.0000 mL | INTRAVENOUS | Status: AC | PRN
  Administered 2024-01-01: 3 mL via INTRAVENOUS

## 2024-01-01 MED ORDER — SPIRONOLACTONE 12.5 MG HALF TABLET
12.5000 mg | ORAL_TABLET | Freq: Every day | ORAL | Status: DC
  Administered 2024-01-01 – 2024-01-03 (×3): 12.5 mg via ORAL
  Filled 2024-01-01 (×3): qty 1

## 2024-01-01 MED ORDER — POTASSIUM CHLORIDE CRYS ER 20 MEQ PO TBCR
40.0000 meq | EXTENDED_RELEASE_TABLET | Freq: Once | ORAL | Status: AC
  Administered 2024-01-01: 40 meq via ORAL
  Filled 2024-01-01: qty 2

## 2024-01-01 NOTE — TOC Initial Note (Signed)
 Transition of Care [facility]) - Initial/Assessment Note    Patient Details  Name: Derrick Bender MRN: [id number] Date of Birth: 02-20-1946  Transition of Care [facility]) CM/SW Contact:    Lucie Lunger, LCSWA Phone Number: 01/01/2024, 1:16 PM  Clinical Narrative:                 [facility] consulted for CHF screen. CSW spoke with pt and brother at bedside to complete assessment. Pt lives with his spouse. Pt states he is independent in completing his ADLs. Pts brother drives him when needed. Pt has had HH in the past but not currently. Pt has all needed DME. Pt had home O2 with [employer] but states he does not currently have it. Pt states he does not weigh daily. Pt states he takes all medications as prescribed. Pt states he does not always follow a heart healthy diet. CSW spoke with pts daughter on phone while in room about pts Cpap machine, at this time she states that it is not working properly but unsure what agency it came from. CSW reached out to [employer] to see if they supplied, awaiting update. TOC to follow.   Expected Discharge Plan: Home/Self Care Barriers to Discharge: Continued Medical Work up   Patient Goals and CMS Choice Patient states their goals for this hospitalization and ongoing recovery are:: from home CMS Medicare.gov Compare Post Acute Care list provided to:: Patient Choice offered to / list presented to : Patient      Expected Discharge Plan and Services In-house Referral: Clinical Social Work Discharge Planning Services: CM Consult   Living arrangements for the past 2 months: Single Family Home                                      Prior Living Arrangements/Services Living arrangements for the past 2 months: Single Family Home Lives with:: Spouse Patient language and need for interpreter reviewed:: Yes Do you feel safe going back to the place where you live?: Yes      Need for Family Participation in Patient Care: Yes (Comment) Care giver support system in  place?: Yes (comment) Current home services: DME Criminal Activity/Legal Involvement Pertinent to Current Situation/Hospitalization: No - Comment as needed  Activities of Daily Living   ADL Screening (condition at time of admission) Independently performs ADLs?: Yes (appropriate for developmental age) Is the patient deaf or have difficulty hearing?: No Does the patient have difficulty seeing, even when wearing glasses/contacts?: No Does the patient have difficulty concentrating, remembering, or making decisions?: No  Permission Sought/Granted                  Emotional Assessment Appearance:: Appears stated age Attitude/Demeanor/Rapport: Engaged Affect (typically observed): Accepting Orientation: : Oriented to Self, Oriented to Place, Oriented to  Time, Oriented to Situation Alcohol  / Substance Use: Not Applicable Psych Involvement: No (comment)  Admission diagnosis:  Hypertensive emergency [I16.1] Acute on chronic systolic (congestive) heart failure (HCC) [I50.23] Acute congestive heart failure, unspecified heart failure type (HCC) [I50.9] Patient Active Problem List   Diagnosis Date Noted   Acute on chronic systolic (congestive) heart failure (HCC) 12/31/2023   OSA on CPAP 09/01/2022   ILD (interstitial lung disease) (HCC) 09/01/2022   Pneumonia due to COVID-19 virus 12/10/2018   COPD (chronic obstructive pulmonary disease) (HCC)    Hyperlipidemia    Seizure (HCC)    Depression    Hypertension  Type 2 diabetes mellitus (HCC)    PCP:  Rosamond Leta NOVAK, MD Pharmacy:   [id number] - [place], [place] - [address] [place] [address] Phone: [phone] Fax: [phone]     Social Drivers of Health (SDOH) Social History: SDOH Screenings   Food Insecurity: No Food Insecurity (12/31/2023)  Housing: Low Risk  (12/31/2023)  Transportation Needs: No Transportation Needs (12/31/2023)  Utilities: Not At Risk (12/31/2023)  Financial  Resource Strain: Low Risk  (09/14/2023)   Received from [facility]  Physical Activity: Insufficiently Active (09/14/2023)   Received from [facility]  Social Connections: Socially Integrated (12/31/2023)  Stress: No Stress Concern Present (09/14/2023)   Received from [facility]  Tobacco Use: Medium Risk (12/31/2023)  Health Literacy: Low Risk  (09/14/2023)   Received from [facility]   SDOH Interventions:     Readmission Risk Interventions    01/01/2024    1:14 PM  Readmission Risk Prevention Plan  Transportation Screening Complete  Home Care Screening Complete  Medication Review (RN CM) Complete

## 2024-01-01 NOTE — Progress Notes (Signed)
 Rounding Note   Patient Name: Derrick Bender Date of Encounter: 01/01/2024  [facility] Cardiologist: Alvan Carrier, MD   Subjective Breathing has improved  Scheduled Meds:  apixaban   5 mg Oral BID   Chlorhexidine  Gluconate Cloth  6 each Topical Daily   donepezil   5 mg Oral Daily   fluticasone  furoate-vilanterol  1 puff Inhalation Daily   furosemide   40 mg Intravenous Q12H   metoprolol  succinate  25 mg Oral BID   mirtazapine   7.5 mg Oral QHS   potassium chloride   40 mEq Oral Once   sacubitril -valsartan   1 tablet Oral BID   simvastatin   10 mg Oral Daily   tamsulosin   0.4 mg Oral Daily   Continuous Infusions:  nitroGLYCERIN  20 mcg/min (01/01/24 0818)   PRN Meds: acetaminophen  **OR** acetaminophen , ALPRAZolam , ipratropium-albuterol , ondansetron  **OR** ondansetron  (ZOFRAN ) IV   Vital Signs  Vitals:   01/01/24 0615 01/01/24 0630 01/01/24 0645 01/01/24 0734  BP: 133/78 (!) 126/99 (!) 146/77   Pulse: 73 (!) 41 77   Resp: 19 18 20    Temp:    (!) 97.5 F (36.4 C)  TempSrc:    Oral  SpO2: 100% 100% 98%   Weight:      Height:        Intake/Output Summary (Last 24 hours) at 01/01/2024 0826 Last data filed at 01/01/2024 0818 Gross per 24 hour  Intake 453.17 ml  Output 3950 ml  Net -3496.83 ml      01/01/2024    5:00 AM 12/31/2023    8:32 AM 12/13/2023    8:22 AM  Last 3 Weights  Weight (lbs) 183 lb 6.8 oz 196 lb 192 lb 12.8 oz  Weight (kg) 83.2 kg 88.905 kg 87.454 kg      Telemetry SR, PVCs - Personally Reviewed  ECG  N/a - Personally Reviewed  Physical Exam  GEN: No acute distress.   Neck: No JVD Cardiac: SR, PVCs.  Respiratory: faint crackles bilateral bases GI: Soft, nontender, non-distended  MS: No edema; No deformity. Neuro:  Nonfocal  Psych: Normal affect   Labs High Sensitivity Troponin:   Recent Labs  Lab 12/31/23 0853 12/31/23 1048  TROPONINIHS 13 9     Chemistry Recent Labs  Lab 12/31/23 0853 12/31/23 1048  01/01/24 0455  NA 148*  --  147*  K 3.5  --  3.1*  CL 109  --  103  CO2 28  --  31  GLUCOSE 84  --  125*  BUN 25*  --  25*  CREATININE 1.24  --  1.31*  CALCIUM  8.3*  --  8.0*  MG  --  2.1  --   PROT 6.1*  --   --   ALBUMIN 3.2*  --   --   AST 14*  --   --   ALT 11  --   --   ALKPHOS 71  --   --   BILITOT 0.8  --   --   GFRNONAA 60*  --  56*  ANIONGAP 11  --  13    Lipids No results for input(s): CHOL, TRIG, HDL, LABVLDL, LDLCALC, CHOLHDL in the last 168 hours.  Hematology Recent Labs  Lab 12/31/23 0853 01/01/24 0455  WBC 8.8 8.4  RBC 4.79 5.56  HGB 11.5* 13.1  HCT 39.4 45.1  MCV 82.3 81.1  MCH 24.0* 23.6*  MCHC 29.2* 29.0*  RDW 23.9* 24.0*  PLT 226 254   Thyroid   Recent Labs  Lab 12/31/23 1048  TSH 0.964    BNP Recent Labs  Lab 12/31/23 0853  BNP 1,757.0*    DDimer No results for input(s): DDIMER in the last 168 hours.   Radiology  DG Chest Port 1 View Result Date: 12/31/2023 CLINICAL DATA:  Dyspnea. EXAM: PORTABLE CHEST 1 VIEW COMPARISON:  04/19/2023 FINDINGS: Cardiomediastinal silhouette is upper limits of normal but stable. Mildly prominent interstitial and vascular markings. Slightly increased densities in the mid and lower left chest. Difficult to exclude mild blunting at the right costophrenic angle. Negative for a pneumothorax. No acute bone abnormality. IMPRESSION: 1. Mildly prominent interstitial and vascular markings. Findings could represent mild vascular congestion. 2. Slightly increased densities in the left lower lung are nonspecific. Developing infection cannot be excluded. Electronically Signed   By: Juliene Balder M.D.   On: 12/31/2023 09:10      Patient Profile   Derrick Bender is a 78 y.o. male with a hx of HTN, HLD, OSA, chronic HFrEF (EF in 2017 65 to 60%, in 2023 60 to 65%; Most recent EF 25-30% in 08/2023 at [facility]), paroxysmal atrial fibrillation (Dx at [facility] 08/2023 with no A-fib detected on ZIO monitor in 09/2023), hx of DVT in  02/2023 (On Eliquis ), Hx of TIA, COPD and leg edema who is being seen 12/31/2023 for the evaluation of Acute on chronic HFrEF and HTN emergency with pulmonary edema at the request of Dr. Charlyn.   Assessment & Plan  1.Acute on chronic HFrEF - 09/2021 echo: LVEF 60-65%, grade I dd, normal RV - 08/2023 echo [facility]: LVEF 25-30% - during May admission at [facility] newly diagnosed with HFrEF. STarted on toprol , entresto , jardiance.  - also diagnosed with afib during admission, from notes some thought may be tachy mediated   - CXR vascular congestion, BNP 1757 - presented volume overloaded in setting of severe HTN, SBP in the 200s - received IV lasix  40mg  x 3 yesterday, due for bid dosing today. Negative 3.5 L yesterday, slight uptrend in Cr but within prior range. Looks to be nearing euvolemic, dose IV lasix  40mg  x 1 today and reassess tomorrow AM.   - medical therapy with entrestro 24/26mg  bid, toprol  25mg  bid. Add aldactone  12.5mg  daily From note SGLT2i was stopped for unexplained weightl loss - repeat echo, if persistent dysfunction begin to discuss possible ischemic evaluation as outpatient.      2. HTN emergency - bp's 230s/100s in setting of pulmonary edema, acute on chronic HFrEF - started on NG drip. Initial MAP 130s, work to bring down gradually roughly 25% over 24 hours -NG gtt at 30 mcg/min, bp's are controlled. Wean NG gtt likely to off this morning.      3. Afib - diagnosed during 08/2023 admission at [facility] - EKG this admit shows NSR - has been on eliquis  for stroke preventin, toprol  for rate control       4. History of DVT - from [facility] DVT diagnosed Jan 2025 due to immobility - he is on eliquis   5. OSA - CPAP at night during admission, needs help arraning outpatient cpap. Reports his home machine has worn out.     For questions or updates, please contact [facility] Please consult [email or web address] for contact info under      Signed, Alvan Carrier, MD  01/01/2024, 8:26  AM

## 2024-01-01 NOTE — Progress Notes (Signed)
 Heart Failure Nurse Navigator Progress Note  PCP: Derrick Leta NOVAK, MD PCP-Cardiologist: Derrick Ross, MD Admission Diagnosis: None Admitted from: Home via POV  [facility] Patient; Patient called remotely to provide CHF education and schedule his AHF TOC appointment.  2 patient identifiers confirmed prior to conversation.  Patient requested that I contact his daughter Derrick Bender to provide her with all of the information I shared with him.  Derrick Bender was contacted by Navigator and provided CHF education as well.    Presentation:   Derrick Bender 78 y.o. male. Presented with shortness of breath for 2 days. He also complained of some increased dyspnea with lying flat and increased edema in bilateral lower extremities. He denied any chest pain, dizziness, lightheadness or syncope.Patient has Hx of CHF & COPD. Patient is compliant with all medications. BNP 1,757. HS-Troponin 9. CRT-1.31. Chest x-ray: mild vascular congestion and left lower lung increased densities.   ECHO/ LVEF: 40-45% Grade 1 DD (impaired relaxation)  Clinical Course:  Past Medical History:  Diagnosis Date   Asthma    COPD (chronic obstructive pulmonary disease) (HCC)    CVA (cerebral vascular accident) (HCC) 01/2011   MMH   Depression    Diabetes (HCC)    Dysarthria    Hyperlipidemia    Hypertension    OSA (obstructive sleep apnea)    Prostate cancer (HCC)    Dr. Beola   Seizure [facility])    TIA (transient ischemic attack)      Social History   Socioeconomic History   Marital status: Married    Spouse name: Not on file   Number of children: Not on file   Years of education: Not on file   Highest education level: Not on file  Occupational History   Not on file  Tobacco Use   Smoking status: Former    Current packs/day: 0.00    Average packs/day: 0.5 packs/day for 37.0 years (18.5 ttl pk-yrs)    Types: Cigarettes    Start date: 01/16/1960    Quit date: 04/18/1995    Years since quitting: 28.7    Passive exposure:  Never   Smokeless tobacco: Never  Substance and Sexual Activity   Alcohol  use: Not on file   Drug use: Not on file   Sexual activity: Not on file  Other Topics Concern   Not on file  Social History Narrative   Not on file   Social Drivers of Health   Financial Resource Strain: Low Risk  (09/14/2023)   Received from [facility]   Overall Financial Resource Strain (CARDIA)    Difficulty of Paying Living Expenses: Not very hard  Food Insecurity: No Food Insecurity (12/31/2023)   Hunger Vital Sign    Worried About Running Out of Food in the Last Year: Never true    Ran Out of Food in the Last Year: Never true  Transportation Needs: No Transportation Needs (12/31/2023)   PRAPARE - Administrator, Civil Service (Medical): No    Lack of Transportation (Non-Medical): No  Physical Activity: Insufficiently Active (09/14/2023)   Received from [facility]   Exercise Vital Sign    On average, how many days per week do you engage in moderate to strenuous exercise (like a brisk walk)?: 2 days    On average, how many minutes do you engage in exercise at this level?: 20 min  Stress: No Stress Concern Present (09/14/2023)   Received from [facility] of Occupational  Health - Occupational Stress Questionnaire    Feeling of Stress : Only a little  Social Connections: Socially Integrated (12/31/2023)   Social Connection and Isolation Panel    Frequency of Communication with Friends and Family: More than three times a week    Frequency of Social Gatherings with Friends and Family: More than three times a week    Attends Religious Services: More than 4 times per year    Active Member of [employer] or Organizations: Yes    Attends Engineer, structural: More than 4 times per year    Marital Status: Married   Water engineer and Provision:  Detailed education and instructions provided on heart failure disease management including the following:  Signs  and symptoms of Heart Failure When to call the physician Importance of daily weights Low sodium diet Fluid restriction Medication management Anticipated future follow-up appointments  Patient education given on each of the above topics.  Patient acknowledges understanding via teach back method and acceptance of all instructions.  Education Materials:  Living Better With Heart Failure Booklet, HF zone tool, & Daily Weight Tracker Tool.  Patient has scale at home: Yes Patient has pill box at home: Yes    High Risk Criteria for Readmission and/or Poor Patient Outcomes: Heart failure hospital admissions (last 6 months): 2  No Show rate: 0% Difficult social situation: None Demonstrates medication adherence: Yes Primary Language: English Literacy level: Reading, Writing, Comprehension  Barriers of Care:   Daily Weights- Patient was weighing 1 time a week.  Encouraged him to do daily weights.  Considerations/Referrals:  Referral made to Heart Failure Pharmacist Stewardship: Yes Referral made to Heart Failure CSW/NCM TOC: No Referral made to Heart & Vascular TOC clinic: Yes. [facility] AHF Clinic 01/09/24 @ 12:00.  Navigator to be in contact with daughter Derrick Bender) as discharge approaches to see if appointment timing details are still appropriate within 7 days of discharge.  Items for Follow-up on DC/TOC: Daily Weights-encourage to do daily weights Diet & Fluid Restrictions- try to decrease daily salt intake Continued Heart Failure Education  Derrick Pines, RN, BSN [facility] Heart Failure Navigator Secure Chat Only

## 2024-01-01 NOTE — Progress Notes (Signed)
*  PRELIMINARY RESULTS* Echocardiogram 2D Echocardiogram has been performed with [facility] .  Derrick Bender 01/01/2024, 2:53 PM

## 2024-01-01 NOTE — Progress Notes (Signed)
 Progress Note   Patient: Derrick Bender FMW:[id number] DOB: 06/01/1945 DOA: 12/31/2023     1 DOS: the patient was seen and examined on 01/01/2024   Brief hospital admission narrative: Derrick Bender is a 78 y.o. male with medical history significant of systolic heart failure, COPD/asthma, history of DVT on chronic Eliquis , depression, diabetes, hyperlipidemia, hypertension, obstructive sleep apnea and BPH; who presented to the hospital secondary to increased shortness of breath, orthopnea and dyspnea on exertion. Patient reported to have been present for the last 4-5 days and worsening.  He has present no fever, no chills, no chest pain, no sick contacts, no abdominal pain, no nausea/vomiting, no dysuria/hematuria or any complaints of melena or hematochezia.   Patient has noticed an increase with weight and also lower extremity swelling.   Workup in the ED demonstrating hypertensive urgency with systolic blood pressure above 799, increased BNP, chest x-ray with vascular congestion and interstitial edema.   Case discussed with cardiology service who recommended initiation of nitroglycerin  drip to assist with blood pressure control, IV diuresis, obtain 2D echo and follow clinical response.  TRH has been contacted to place patient in the hospital for further evaluation and management.    Assessment and plan 1-acute on chronic systolic heart failure - Continue daily weights, strict I's and O's and low-sodium diet - Will continue IV diuresis and follow echo results - Appreciate assistance and recommendation regarding further GDMT by cardiology service. - Will continue metoprolol  and Entresto .  2-history of COPD/asthma - No wheezing or signs of acute exacerbation currently appreciated - Continue bronchodilator management.  3-hypertension urgency - Excellent response on his stabilization to the use of nitroglycerin  drip - Will work on transition nitroglycerin  drip of IV vestibulitis  transferred to telemetry bed - Will also continue treatment with Lasix , Flomax , Entresto  and metoprolol  - Heart healthy/low-sodium diet discussed with patient.  4-history of BPH - Continue Flomax . - No complaints of urinary retention reported.  5-prior history of DVT - Continue Eliquis .  6-history of depression/anxiety - No suicidal ideation or hallucination - Stable mood - Continue treatment with Remeron  and as needed Xanax .  7-hyperlipidemia - Continue statin.  8-history of obstructive sleep apnea - Continue CPAP nightly  9-history of cognitive impairment - Stable - Continue considering additional supportive care - Continue home Aricept .  10-hyperglycemia/history of type 2 diabetes - A1c 5.1 - Continue diet management.  11-hypokalemia - In the setting of diuresis - Will replete electrolytes and follow trend.   Subjective:  No chest pain, no nausea, no vomiting.  Reports improvement in his breathing.  Blood pressure much better control after using nitroglycerin  drip.  Physical Exam: Vitals:   01/01/24 1400 01/01/24 1430 01/01/24 1500 01/01/24 1618  BP: (!) 153/53 (!) 171/58 (!) 166/56   Pulse:      Resp: 15 (!) 29 (!) 27   Temp:    98.1 F (36.7 C)  TempSrc:    Oral  SpO2: 99% 100% 100%   Weight:      Height:       General exam: Alert, awake, oriented x 3; in no acute distress. Respiratory system: Decreased breath sounds at the bases; no wheezing, no using accessory muscle. Cardiovascular system:RRR.  No rubs, no gallops, no JVD. Gastrointestinal system: Abdomen is nondistended, soft and nontender.  Positive bowel sounds appreciated on exam. Central nervous system: No focal neurological deficits. Extremities: No cyanosis or clubbing; trace edema appreciated bilaterally. Skin: No petechiae. Psychiatry: Mood & affect appropriate.    Data  Reviewed: Basic metabolic panel: Sodium 147, potassium 3.1, chloride 103, bicarb 31, BUN 25, creatinine 1.31 and GFR  56 CBC: WBCs 8.4, hemoglobin 13.1 and platelet count 254K  Family Communication: No family at bedside.  Disposition: Status is: Inpatient Remains inpatient appropriate because: Continue IV diuresis.  Anticipating discharge back home once medically stable.  Time spent: 50 minutes  Author: Eric Nunnery, MD 01/01/2024 5:58 PM  For on call review [email or web address].

## 2024-01-01 NOTE — Progress Notes (Unsigned)
 Patient eating lunch.     Celesta Gentile, RCS

## 2024-01-02 ENCOUNTER — Telehealth: Payer: Self-pay

## 2024-01-02 DIAGNOSIS — I493 Ventricular premature depolarization: Secondary | ICD-10-CM

## 2024-01-02 DIAGNOSIS — I480 Paroxysmal atrial fibrillation: Secondary | ICD-10-CM | POA: Diagnosis not present

## 2024-01-02 DIAGNOSIS — I161 Hypertensive emergency: Secondary | ICD-10-CM

## 2024-01-02 DIAGNOSIS — I5023 Acute on chronic systolic (congestive) heart failure: Secondary | ICD-10-CM | POA: Diagnosis not present

## 2024-01-02 LAB — BASIC METABOLIC PANEL WITH GFR
Anion gap: 9 (ref 5–15)
BUN: 27 mg/dL — ABNORMAL HIGH (ref 8–23)
CO2: 32 mmol/L (ref 22–32)
Calcium: 8.5 mg/dL — ABNORMAL LOW (ref 8.9–10.3)
Chloride: 104 mmol/L (ref 98–111)
Creatinine, Ser: 1.49 mg/dL — ABNORMAL HIGH (ref 0.61–1.24)
GFR, Estimated: 48 mL/min — ABNORMAL LOW (ref >60)
Glucose, Bld: 106 mg/dL — ABNORMAL HIGH (ref 70–99)
Potassium: 4.1 mmol/L (ref 3.5–5.1)
Sodium: 145 mmol/L (ref 135–145)

## 2024-01-02 MED ORDER — FUROSEMIDE 40 MG PO TABS: 40.0000 mg | ORAL_TABLET | Freq: Every day | ORAL | Status: DC

## 2024-01-02 MED ORDER — SACUBITRIL-VALSARTAN 49-51 MG PO TABS: 1.0000 | ORAL_TABLET | Freq: Two times a day (BID) | ORAL | Status: DC

## 2024-01-02 MED ORDER — SACUBITRIL-VALSARTAN 49-51 MG PO TABS
1.0000 | ORAL_TABLET | Freq: Two times a day (BID) | ORAL | Status: DC
  Administered 2024-01-02 – 2024-01-03 (×2): 1 via ORAL
  Filled 2024-01-02 (×4): qty 1

## 2024-01-02 MED ORDER — FUROSEMIDE 40 MG PO TABS
40.0000 mg | ORAL_TABLET | Freq: Two times a day (BID) | ORAL | Status: DC
  Administered 2024-01-02 – 2024-01-03 (×2): 40 mg via ORAL
  Filled 2024-01-02 (×2): qty 1

## 2024-01-02 MED ORDER — HYDRALAZINE HCL 20 MG/ML IJ SOLN
10.0000 mg | Freq: Four times a day (QID) | INTRAMUSCULAR | Status: DC | PRN
  Administered 2024-01-02: 10 mg via INTRAVENOUS
  Filled 2024-01-02: qty 1

## 2024-01-02 NOTE — Progress Notes (Signed)
 Heart Failure Stewardship Pharmacy Note  PCP: Rosamond Leta NOVAK, MD PCP-Cardiologist: Alvan Carrier, MD  HPI: Derrick Bender is a 78 y.o. male with systolic heart failure, COPD/asthma, history of DVT on chronic Eliquis , depression, OSA, T2 diabetes, hyperlipidemia, hypertension, obstructive sleep apnea and BPH who presented with shortness of breath and orthopnea. On admission, BNP was 1757, HS-troponin was 13, and TSH was 0.964. Chest x-ray noted mild vascular congestion.   Pertinent cardiac history: TTE 11/2015 with LVEF of 65-70%, moderate LVH, G1DD. Low risk stress test in 12/2015. TTE 01/01/24 showed LVEF down to 40-45% with G1DD, mild MR, and mild to moderate AR.   Pertinent Lab Values: Creatinine, Ser  Date Value Ref Range Status  01/02/2024 1.49 (H) 0.61 - 1.24 mg/dL Final   BUN  Date Value Ref Range Status  01/02/2024 27 (H) 8 - 23 mg/dL Final   Potassium  Date Value Ref Range Status  01/02/2024 4.1 3.5 - 5.1 mmol/L Final   Sodium  Date Value Ref Range Status  01/02/2024 145 135 - 145 mmol/L Final   B Natriuretic Peptide  Date Value Ref Range Status  12/31/2023 1,757.0 (H) 0.0 - 100.0 pg/mL Final    Comment:    Performed at [facility], [address], [place], [place] [address]   Magnesium   Date Value Ref Range Status  12/31/2023 2.1 1.7 - 2.4 mg/dL Final    Comment:    Performed at [facility], [address], [place], [place] [address]   Hgb A1c MFr Bld  Date Value Ref Range Status  01/01/2024 5.1 4.8 - 5.6 % Final    Comment:    (NOTE) Diagnosis of Diabetes The following HbA1c ranges recommended by the American Diabetes Association (ADA) may be used as an aid in the diagnosis of diabetes mellitus.  Hemoglobin             Suggested A1C NGSP%              Diagnosis  <5.7                   Non Diabetic  5.7-6.4                Pre-Diabetic  >6.4                   Diabetic  <7.0                   Glycemic control for                       adults  with diabetes.     TSH  Date Value Ref Range Status  12/31/2023 0.964 0.350 - 4.500 uIU/mL Final    Comment:    Performed by a 3rd Generation assay with a functional sensitivity of <=0.01 uIU/mL. Performed at [facility], [address], [place], [place] [address]    LDH  Date Value Ref Range Status  12/18/2018 392 (H) 98 - 192 U/L Final    Comment:    Performed at [facility], [address], [place], [place] [address]    Vital Signs:  Temp:  [97.7 F (36.5 C)-98.8 F (37.1 C)] 98.1 F (36.7 C) (09/17 0735) Pulse Rate:  [33-78] 61 (09/17 0600) Cardiac Rhythm: Normal sinus rhythm (09/17 0000) Resp:  [10-29] 13 (09/17 0700) BP: (96-195)/(44-94) 195/75 (09/17 0700) SpO2:  [90 %-100 %] 96 % (09/17 0700) Weight:  [83 kg (182  lb 15.7 oz)] 83 kg (182 lb 15.7 oz) (09/17 0500)  Intake/Output Summary (Last 24 hours) at 01/02/2024 0740 Last data filed at 01/02/2024 9478 Gross per 24 hour  Intake 271.21 ml  Output 1600 ml  Net -1328.79 ml    Current Heart Failure Medications:  Loop diuretic: furosemide  40 mg PO BID Beta-Blocker: metoprolol  succinate 25 mg BID ACEI/ARB/ARNI: Entresto  49-51 mg BID MRA: spironolactone  12.5 mg daily SGLT2i: none Other: none  Prior to admission Heart Failure Medications:  Loop diuretic: furosemide  20 mg daily Beta-Blocker: metoprolol  succinate 25 mg daily ACEI/ARB/ARNI: none MRA: none SGLT2i: none Other: none  Assessment: 1. Acute on chronic combined systolic and diastolic heart failure (LVEF 40-45%) with G1DD, due to presumed NICM. NYHA class III symptoms.  -Symptoms: Patient reports shortness of breath is improved. States difficulty in managing with things at home due to his wife having dementia.  -Volume: Appears to have diuresed well. Creatinine trending up with BUN. Furosemide  transitioned from IV to PO 40 mg BID. -Hemodynamics: BP elevated. HR 60-70s.  -BB: Continue metoprolol  succinate 25 mg BID -ACEI/ARB/ARNI:  Entresto  increased to 49-51 mg BID. -MRA: Currently on spironolactone  12.5 mg daily. Consider increasing tomorrow if BMET stable. -SGLT2i: Previously stopped Jardiance for unexplained weight loss. Likely worth retrialing Farxiga in the future.  Plan: 1) Medication changes recommended at this time: -Entresto  increased today  2) Patient assistance: -Pending  3) Education: - Patient has been educated on current HF medications and potential additions to HF medication regimen - Patient verbalizes understanding that over the next few months, these medication doses may change and more medications may be added to optimize HF regimen - Patient has been educated on basic disease state pathophysiology and goals of therapy  Please do not hesitate to reach out with questions or concerns,  Bladen Umar, PharmD, CPP, BCPS, [facility] Heart Failure Pharmacist  Phone - [phone] 01/02/2024 2:26 PM

## 2024-01-02 NOTE — Progress Notes (Addendum)
 Rounding Note   Patient Name: Derrick Bender Date of Encounter: 01/02/2024  [facility] Cardiologist: Alvan Carrier, MD   Subjective SOB this am.  O2 sats 95% on 2L  Scheduled Meds:  apixaban   5 mg Oral BID   Chlorhexidine  Gluconate Cloth  6 each Topical Daily   donepezil   5 mg Oral Daily   fluticasone  furoate-vilanterol  1 puff Inhalation Daily   metoprolol  succinate  25 mg Oral BID   mirtazapine   7.5 mg Oral QHS   sacubitril -valsartan   1 tablet Oral BID   simvastatin   10 mg Oral Daily   spironolactone   12.5 mg Oral Daily   tamsulosin   0.4 mg Oral Daily   Continuous Infusions:   PRN Meds: acetaminophen  **OR** acetaminophen , ALPRAZolam , ipratropium-albuterol , ondansetron  **OR** ondansetron  (ZOFRAN ) IV   Vital Signs  Vitals:   01/02/24 0700 01/02/24 0735 01/02/24 0800 01/02/24 1000  BP: (!) 195/75  (!) 144/86 (!) 160/74  Pulse:   79 71  Resp: 13  (!) 27 (!) 23  Temp:  98.1 F (36.7 C)    TempSrc:  Oral    SpO2: 96% 94% 93% 96%  Weight:      Height:        Intake/Output Summary (Last 24 hours) at 01/02/2024 1034 Last data filed at 01/02/2024 0521 Gross per 24 hour  Intake 240 ml  Output 1400 ml  Net -1160 ml      01/02/2024    5:00 AM 01/01/2024    5:00 AM 12/31/2023    8:32 AM  Last 3 Weights  Weight (lbs) 182 lb 15.7 oz 183 lb 6.8 oz 196 lb  Weight (kg) 83 kg 83.2 kg 88.905 kg      Telemetry NSR with bigeminal PVCs - Personally Reviewed  ECG  No new EKG to review- Personally Reviewed  Physical Exam  GEN: Well nourished, well developed in no acute distress HEENT: Normal NECK: No JVD; No carotid bruits LYMPHATICS: No lymphadenopathy CARDIAC:RRR, no murmurs, rubs, gallops RESPIRATORY: scant crackle at left base ABDOMEN: Soft, non-tender, non-distended MUSCULOSKELETAL:  No edema; No deformity  SKIN: Warm and dry NEUROLOGIC:  Alert and oriented x 3 PSYCHIATRIC:  Normal affect  Labs High Sensitivity Troponin:   Recent Labs  Lab  12/31/23 0853 12/31/23 1048  TROPONINIHS 13 9     Chemistry Recent Labs  Lab 12/31/23 0853 12/31/23 1048 01/01/24 0455 01/02/24 0242  NA 148*  --  147* 145  K 3.5  --  3.1* 4.1  CL 109  --  103 104  CO2 28  --  31 32  GLUCOSE 84  --  125* 106*  BUN 25*  --  25* 27*  CREATININE 1.24  --  1.31* 1.49*  CALCIUM  8.3*  --  8.0* 8.5*  MG  --  2.1  --   --   PROT 6.1*  --   --   --   ALBUMIN 3.2*  --   --   --   AST 14*  --   --   --   ALT 11  --   --   --   ALKPHOS 71  --   --   --   BILITOT 0.8  --   --   --   GFRNONAA 60*  --  56* 48*  ANIONGAP 11  --  13 9    Lipids No results for input(s): CHOL, TRIG, HDL, LABVLDL, LDLCALC, CHOLHDL in the last 168 hours.  Hematology Recent Labs  Lab 12/31/23 0853 01/01/24 0455  WBC 8.8 8.4  RBC 4.79 5.56  HGB 11.5* 13.1  HCT 39.4 45.1  MCV 82.3 81.1  MCH 24.0* 23.6*  MCHC 29.2* 29.0*  RDW 23.9* 24.0*  PLT 226 254   Thyroid   Recent Labs  Lab 12/31/23 1048  TSH 0.964    BNP Recent Labs  Lab 12/31/23 0853  BNP 1,757.0*    DDimer No results for input(s): DDIMER in the last 168 hours.   Radiology  ECHOCARDIOGRAM COMPLETE Result Date: 01/01/2024    ECHOCARDIOGRAM REPORT   Patient Name:   Derrick Bender Date of Exam: 01/01/2024 Medical Rec #:  [id number]        Height:       69.0 in Accession #:    [id number]       Weight:       183.4 lb Date of Birth:  11-06-1945        BSA:          1.991 m Patient Age:    78 years         BP:           146/76 mmHg Patient Gender: M                HR:           77 bpm. Exam Location:  [facility] Procedure: 2D Echo, Cardiac Doppler, Color Doppler and Intracardiac            Opacification Agent (Both Spectral and Color Flow Doppler were            utilized during procedure). Indications:    Dyspnea R06.00  History:        Patient has prior history of Echocardiogram examinations, most                 recent 09/27/2021. Risk Factors:Hypertension, Diabetes,                 Dyslipidemia  and Former Smoker.  Sonographer:    Aida Pizza RCS Referring Phys: [id number] DORN FALCON BRANCH IMPRESSIONS  1. Left ventricular ejection fraction, by estimation, is 40 to 45%. The left ventricle has mildly decreased function. The left ventricle demonstrates global hypokinesis. There is severe left ventricular hypertrophy. Left ventricular diastolic parameters  are consistent with Grade I diastolic dysfunction (impaired relaxation).  2. Right ventricular systolic function is normal. The right ventricular size is normal. Tricuspid regurgitation signal is inadequate for assessing PA pressure.  3. The mitral valve is normal in structure. Mild mitral valve regurgitation. No evidence of mitral stenosis.  4. The aortic valve is tricuspid. There is mild calcification of the aortic valve. There is mild thickening of the aortic valve. Aortic valve regurgitation is mild to moderate. No aortic stenosis is present. FINDINGS  Left Ventricle: Left ventricular ejection fraction, by estimation, is 40 to 45%. The left ventricle has mildly decreased function. The left ventricle demonstrates global hypokinesis. Definity  contrast agent was given IV to delineate the left ventricular  endocardial borders. The left ventricular internal cavity size was normal in size. There is severe left ventricular hypertrophy. Left ventricular diastolic parameters are consistent with Grade I diastolic dysfunction (impaired relaxation). Normal left ventricular filling pressure. Right Ventricle: The right ventricular size is normal. Right vetricular wall thickness was not well visualized. Right ventricular systolic function is normal. Tricuspid regurgitation signal is inadequate for assessing PA pressure. Left Atrium: Left atrial size was normal in size. Right Atrium: Right  atrial size was normal in size. Pericardium: There is no evidence of pericardial effusion. Mitral Valve: The mitral valve is normal in structure. There is mild thickening of the mitral  valve leaflet(s). There is mild calcification of the mitral valve leaflet(s). Mild mitral annular calcification. Mild mitral valve regurgitation. No evidence of  mitral valve stenosis. Tricuspid Valve: The tricuspid valve is normal in structure. Tricuspid valve regurgitation is trivial. No evidence of tricuspid stenosis. Aortic Valve: The aortic valve is tricuspid. There is mild calcification of the aortic valve. There is mild thickening of the aortic valve. There is mild aortic valve annular calcification. Aortic valve regurgitation is mild to moderate. Aortic regurgitation PHT measures 895 msec. No aortic stenosis is present. Aortic valve mean gradient measures 7.0 mmHg. Aortic valve peak gradient measures 13.0 mmHg. Aortic valve area, by VTI measures 1.74 cm. Pulmonic Valve: The pulmonic valve was not well visualized. Pulmonic valve regurgitation is not visualized. No evidence of pulmonic stenosis. Aorta: The aortic root is normal in size and structure. Venous: The inferior vena cava was not well visualized. IAS/Shunts: No atrial level shunt detected by color flow Doppler.  LEFT VENTRICLE PLAX 2D LVIDd:         5.10 cm      Diastology LVIDs:         3.90 cm      LV e' medial:    4.85 cm/s LV PW:         1.60 cm      LV E/e' medial:  14.2 LV IVS:        1.60 cm      LV e' lateral:   5.35 cm/s LVOT diam:     2.10 cm      LV E/e' lateral: 12.9 LV SV:         70 LV SV Index:   35 LVOT Area:     3.46 cm  LV Volumes (MOD) LV vol d, MOD A4C: 111.0 ml LV vol s, MOD A4C: 68.4 ml LV SV MOD A4C:     111.0 ml RIGHT VENTRICLE RV S prime:     17.30 cm/s TAPSE (M-mode): 2.5 cm LEFT ATRIUM             Index        RIGHT ATRIUM           Index LA diam:        3.70 cm 1.86 cm/m   RA Area:     21.10 cm LA Vol (A2C):   71.1 ml 35.70 ml/m  RA Volume:   61.70 ml  30.98 ml/m LA Vol (A4C):   49.2 ml 24.71 ml/m LA Biplane Vol: 57.5 ml 28.87 ml/m  AORTIC VALVE AV Area (Vmax):    1.72 cm AV Area (Vmean):   1.69 cm AV Area (VTI):      1.74 cm AV Vmax:           180.00 cm/s AV Vmean:          121.000 cm/s AV VTI:            0.404 m AV Peak Grad:      13.0 mmHg AV Mean Grad:      7.0 mmHg LVOT Vmax:         89.20 cm/s LVOT Vmean:        58.950 cm/s LVOT VTI:          0.202 m LVOT/AV VTI ratio: 0.50 AI PHT:  895 msec  AORTA Ao Root diam: 3.50 cm MITRAL VALVE MV Area (PHT): 2.95 cm     SHUNTS MV Decel Time: 257 msec     Systemic VTI:  0.20 m MV E velocity: 68.80 cm/s   Systemic Diam: 2.10 cm MV A velocity: 106.00 cm/s MV E/A ratio:  0.65 Dorn Ross MD Electronically signed by Dorn Ross MD Signature Date/Time: 01/01/2024/3:30:11 PM    Final       Patient Profile   Derrick Bender is a 78 y.o. male with a hx of HTN, HLD, OSA, chronic HFrEF (EF in 2017 65 to 60%, in 2023 60 to 65%; Most recent EF 25-30% in 08/2023 at [facility] paroxysmal atrial fibrillation (Dx at [facility] 08/2023 with no A-fib detected on ZIO monitor in 09/2023), hx of DVT in 02/2023 (On Eliquis ), Hx of TIA, COPD and leg edema who is being seen 12/31/2023 for the evaluation of Acute on chronic HFrEF and HTN emergency with pulmonary edema at the request of Dr. Charlyn.   Assessment & Plan  Acute on chronic HFrEF - 09/2021 echo: LVEF 60-65%, grade I dd, normal RV - 08/2023 echo [facility]: LVEF 25-30% - during May admission at [facility] newly diagnosed with HFrEF. STarted on toprol , entresto , jardiance.  - also diagnosed with afib during admission, from notes some thought may be tachy mediated - presented volume overloaded in setting of severe HTN, SBP in the 200s - CXR vascular congestion, BNP 1757 - received IV lasix  40mg  x 3 and now on 40mg  IV BID. Negative 4.8 L overall.  SCr now uptrending with increased BUN and CO2>>suspect he is now euvolemic - transition IV Lasix  to 40mg  PO BID - Current GDMT: Entresto  24-26mg  BID, Toprol  XL 25mg  BID and spiro 12.5mg  daily - BP remains poorly controlled so will increase Entresto  to 49-51mg  BID - From note SGLT2i was stopped for  unexplained weight loss - repeat echo, if persistent dysfunction begin to discuss possible ischemic evaluation as outpatient depending on overall functional status assessment.     HTN emergency - bp's 230s/100s in setting of pulmonary edema, acute on chronic HFrEF - initially treated with IV NTG gt - now on Entresto  24-26mg  BID, Toprol  XL 25mg  BID and spiro 12.5mg  daily  - will increase Entresto  to 49-51mg  BID for better BP control but will need to follow renal function closely   PAfib Bigeminal PVCs - diagnosed during 08/2023 admission at [facility] - EKG this admit shows NSR - has been on eliquis  for stroke preventin, toprol  for rate control - continue Eliquis  5mg  BID and Toprol  XL 25mg  BID   History of DVT - from [facility] DVT diagnosed Jan 2025 due to immobility - he is on eliquis   OSA - CPAP at night during admission, needs help arraning outpatient cpap. Reports his home machine has worn out.   I spent 30 minutes caring for this patient today face to face, ordering and reviewing labs, reviewing records from 2D echo 01/01/2024, seeing the patient, documenting in the record  For questions or updates, please contact [facility] Please consult [email or web address] for contact info under      Signed, Wilbert Bihari, MD  01/02/2024, 10:34 AM

## 2024-01-02 NOTE — TOC Transition Note (Signed)
 Transition of Care [facility]) - Discharge Note   Patient Details  Name: Derrick Bender MRN: [id number] Date of Birth: Mar 04, 1946  Transition of [facility]) CM/SW Contact:  Lucie Lunger, LCSWA Phone Number: 01/02/2024, 12:34 PM   Clinical Narrative:    CSW updated by [facility] who states due to pts insurance he will need to go through [employer] to get new Cpap. CSW spoke to Milroy with [employer] who states pt will need to complete an office visit with pulmonologist. CSW reached out to pulm to schedule appointment, CSW added appointment time to AVS. CSW spoke with pts daughter to provide update on appointment, she is understanding and will make sure pt gets to appointment. TOC signing off.   Final next level of care: Home/Self Care Barriers to Discharge: Barriers Resolved   Patient Goals and CMS Choice Patient states their goals for this hospitalization and ongoing recovery are:: return home CMS Medicare.gov Compare Post Acute Care list provided to:: Patient Choice offered to / list presented to : Patient, Adult Children      Discharge Placement                       Discharge Plan and Services Additional resources added to the After Visit Summary for   In-house Referral: Clinical Social Work Discharge Planning Services: CM Consult                                 Social Drivers of Health (SDOH) Interventions SDOH Screenings   Food Insecurity: No Food Insecurity (12/31/2023)  Housing: Low Risk  (12/31/2023)  Transportation Needs: No Transportation Needs (12/31/2023)  Utilities: Not At Risk (12/31/2023)  Financial Resource Strain: Low Risk  (09/14/2023)   Received from [facility]  Physical Activity: Insufficiently Active (09/14/2023)   [facility] from [facility]  Social Connections: Socially Integrated (12/31/2023)  Stress: No Stress Concern Present (09/14/2023)   Received from [facility]  Tobacco Use: Medium Risk (12/31/2023)  Health Literacy: Low Risk   (09/14/2023)   Received from [facility]     Readmission Risk Interventions    01/01/2024    1:14 PM  Readmission Risk Prevention Plan  Transportation Screening Complete  Home Care Screening Complete  Medication Review (RN CM) Complete

## 2024-01-02 NOTE — Progress Notes (Signed)
 Progress Note   Patient: Derrick Bender FMW:[id number] DOB: [birth year] DOA: 12/31/2023     2 DOS: the patient was seen and examined on 01/02/2024   Brief hospital admission narrative: Derrick Bender is a 78 y.o. male with medical history significant of systolic heart failure, COPD/asthma, history of DVT on chronic Eliquis , depression, diabetes, hyperlipidemia, hypertension, obstructive sleep apnea and BPH; who presented to the hospital secondary to increased shortness of breath, orthopnea and dyspnea on exertion. Patient reported to have been present for the last 4-5 days and worsening.  He has present no fever, no chills, no chest pain, no sick contacts, no abdominal pain, no nausea/vomiting, no dysuria/hematuria or any complaints of melena or hematochezia.   Patient has noticed an increase with weight and also lower extremity swelling.   Workup in the ED demonstrating hypertensive urgency with systolic blood pressure above 799, increased BNP, chest x-ray with vascular congestion and interstitial edema.   Case discussed with cardiology service who recommended initiation of nitroglycerin  drip to assist with blood pressure control, IV diuresis, obtain 2D echo and follow clinical response.  TRH has been contacted to place patient in the hospital for further evaluation and management.    Assessment and plan 1-acute on chronic systolic heart failure - Continue daily weights, strict I's and O's and low-sodium diet - Will continue IV diuresis and follow clinical response; 2D echo demonstrated ejection fraction of 40% - Appreciate assistance and recommendation regarding further GDMT by cardiology service. - Will continue metoprolol  and Entresto ; adjustment on last will be followed as per cardiology recommendations (looking to be increased to 49-51 mg twice a day)..  2-history of COPD/asthma - No wheezing or signs of acute exacerbation currently appreciated - Continue bronchodilator  management.  3-hypertension urgency - Excellent response on his stabilization to the use of nitroglycerin  drip - Will work on transition nitroglycerin  drip of IV vestibulitis transferred to telemetry bed - Will also continue treatment with Lasix , Flomax , Entresto  and metoprolol  - Heart healthy/low-sodium diet discussed with patient.  4-history of BPH - Continue Flomax . - No complaints of urinary retention reported.  5-prior history of DVT - Continue Eliquis .  6-history of depression/anxiety - No suicidal ideation or hallucination - Stable mood - Continue treatment with Remeron  and as needed Xanax .  7-hyperlipidemia - Continue statin.  8-history of obstructive sleep apnea - Continue CPAP nightly  9-history of cognitive impairment - Stable - Continue considering additional supportive care - Continue home Aricept .  10-hyperglycemia/history of type 2 diabetes - A1c 5.1 - Continue diet management.  11-hypokalemia - In the setting of diuresis - Will replete electrolytes and follow trend.   Subjective:  Good blood pressure off nitroglycerin  drip; patient denies chest pain, no nausea, no vomiting.  2 L nasal cannula in place.  Physical Exam: Vitals:   01/02/24 1126 01/02/24 1510 01/02/24 1635 01/02/24 1700  BP:  (!) 186/67  (!) 193/94  Pulse:  67  72  Resp:  (!) 23  19  Temp: 97.9 F (36.6 C)  98.2 F (36.8 C)   TempSrc: Oral  Oral   SpO2:  98%  93%  Weight:      Height:       General exam: Alert, awake, oriented x 3; reporting feeling short winded and is slightly more short of breath today than yesterday. Respiratory system: Decreased breath sounds at the bases; no using accessory muscles.  2 L nasal cannula in place. Cardiovascular system: Rate controlled; PVCs appreciated on telemetry.  No rubs,  no gallops and no JVD present on exam. Gastrointestinal system: Abdomen is nondistended, soft and nontender. No organomegaly or masses felt. Normal bowel sounds  heard. Central nervous system: No focal neurological deficits. Extremities: No cyanosis or clubbing. Skin: No petechiae. Psychiatry: Judgement and insight appear normal. Mood & affect appropriate.    Data Reviewed: Basic metabolic panel: Sodium 147, potassium 3.1, chloride 103, bicarb 31, BUN 25, creatinine 1.31 and GFR 56 CBC: WBCs 8.4, hemoglobin 13.1 and platelet count 254K  Family Communication: No family at bedside.  Disposition: Status is: Inpatient Remains inpatient appropriate because: Continue IV diuresis.  Anticipating discharge back home once medically stable.  Time spent: 50 minutes  Author: Eric Nunnery, MD 01/02/2024 6:31 PM  For on call review [email or web address].

## 2024-01-03 DIAGNOSIS — I493 Ventricular premature depolarization: Secondary | ICD-10-CM | POA: Diagnosis not present

## 2024-01-03 DIAGNOSIS — I480 Paroxysmal atrial fibrillation: Secondary | ICD-10-CM | POA: Diagnosis not present

## 2024-01-03 DIAGNOSIS — I5023 Acute on chronic systolic (congestive) heart failure: Secondary | ICD-10-CM | POA: Diagnosis not present

## 2024-01-03 DIAGNOSIS — I161 Hypertensive emergency: Secondary | ICD-10-CM | POA: Diagnosis not present

## 2024-01-03 LAB — BASIC METABOLIC PANEL WITH GFR
Anion gap: 11 (ref 5–15)
BUN: 25 mg/dL — ABNORMAL HIGH (ref 8–23)
CO2: 30 mmol/L (ref 22–32)
Calcium: 8.5 mg/dL — ABNORMAL LOW (ref 8.9–10.3)
Chloride: 101 mmol/L (ref 98–111)
Creatinine, Ser: 1.29 mg/dL — ABNORMAL HIGH (ref 0.61–1.24)
GFR, Estimated: 57 mL/min — ABNORMAL LOW (ref >60)
Glucose, Bld: 110 mg/dL — ABNORMAL HIGH (ref 70–99)
Potassium: 3.8 mmol/L (ref 3.5–5.1)
Sodium: 142 mmol/L (ref 135–145)

## 2024-01-03 MED ORDER — SPIRONOLACTONE 25 MG PO TABS: 12.5000 mg | ORAL_TABLET | Freq: Every day | ORAL | 2 refills | Status: AC

## 2024-01-03 MED ORDER — SACUBITRIL-VALSARTAN 49-51 MG PO TABS: 1.0000 | ORAL_TABLET | Freq: Two times a day (BID) | ORAL | 2 refills | Status: AC

## 2024-01-03 MED ORDER — FUROSEMIDE 40 MG PO TABS: 40.0000 mg | ORAL_TABLET | Freq: Every day | ORAL | 2 refills | Status: AC

## 2024-01-03 MED ORDER — FUROSEMIDE 40 MG PO TABS: 40.0000 mg | ORAL_TABLET | Freq: Every day | ORAL | Status: DC

## 2024-01-03 MED ORDER — ALPRAZOLAM 0.25 MG PO TABS: 0.2500 mg | ORAL_TABLET | Freq: Two times a day (BID) | ORAL | 0 refills | Status: AC | PRN

## 2024-01-03 NOTE — TOC Transition Note (Addendum)
 Transition of Care [facility]) - Discharge Note   Patient Details  Name: Derrick Bender MRN: [id number] Date of Birth: 08-08-1945  [facility] CM/SW Contact:  Lucie Lunger, LCSWA Phone Number: 01/03/2024, 1:56 PM   Clinical Narrative:    CSW updated that PT is recommending HH PT, CSW met with pt and he is agreeable to this being arranged and has no agency preference. CSW reached out to Lauraine with [facility] to see if they can accept. CSW updated pt qualifies for home O2, this has to be ordered through [employer] due to insurance. CSW sent referral to Zach with [employer] and he will get O2 delivered to pt in room. TOC signing off.   Final next level of care: Home w Home Health Services Barriers to Discharge: Barriers Resolved   Patient Goals and CMS Choice Patient states their goals for this hospitalization and ongoing recovery are:: return home CMS Medicare.gov Compare Post Acute Care list provided to:: Patient Choice offered to / list presented to : Patient, Adult Children      Discharge Placement                       Discharge Plan and Services Additional resources added to the After Visit Summary for   In-house Referral: Clinical Social Work Discharge Planning Services: CM Consult            DME Arranged: Oxygen  DME Agency: [employer] Date DME Agency Contacted: 01/03/24   Representative spoke with at DME Agency: Darlyn HH Arranged: PT, OT HH Agency: [facility] Date [facility] Agency Contacted: 01/03/24   Representative spoke with at [facility] Agency: Lauraine  Social Drivers of Health (SDOH) Interventions SDOH Screenings   Food Insecurity: No Food Insecurity (12/31/2023)  Housing: Low Risk  (12/31/2023)  Transportation Needs: No Transportation Needs (12/31/2023)  Utilities: Not At Risk (12/31/2023)  Financial Resource Strain: Low Risk  (09/14/2023)   Received from [facility]  Physical Activity: Insufficiently Active (09/14/2023)   Received from [facility]  Social Connections: Socially Integrated (12/31/2023)  Stress: No Stress Concern Present (09/14/2023)   Received from [facility]  Tobacco Use: Medium Risk (12/31/2023)  Health Literacy: Low Risk  (09/14/2023)   Received from [facility]     Readmission Risk Interventions    01/03/2024    1:54 PM 01/01/2024    1:14 PM  Readmission Risk Prevention Plan  Transportation Screening Complete Complete  Home Care Screening Complete Complete  Medication Review (RN CM) Complete Complete

## 2024-01-03 NOTE — Progress Notes (Signed)
 0.5mg  xanax  found at computer in room by NT Johnna. This RN called pharmacy and spoke with Elspeth, instructed to return to pyxis.

## 2024-01-03 NOTE — Evaluation (Signed)
 Physical Therapy Evaluation Patient Details Name: Derrick Bender MRN: [id number] DOB: 1945/04/23 Today's Date: 01/03/2024  History of Present Illness  Derrick Bender is a 78 y.o. male with medical history significant of systolic heart failure, COPD/asthma, history of DVT on chronic Eliquis , depression, diabetes, hyperlipidemia, hypertension, obstructive sleep apnea and BPH; who presented to the hospital secondary to increased shortness of breath, orthopnea and dyspnea on exertion.  Patient reported to have been present for the last 4-5 days and worsening.  He has present no fever, no chills, no chest pain, no sick contacts, no abdominal pain, no nausea/vomiting, no dysuria/hematuria or any complaints of melena or hematochezia. Patient has noticed an increase with weight and also lower extremity swelling.   Clinical Impression  Patient demonstrates slow labored movement for sitting up at bedside but is able to self complete without physical assistance given increased time, most difficulty with getting trunk upright. Patient with slight difficulty initiating and completing STS, demonstrating excessive forward lean and very labored movement to clear buttocks into full standing, indicating LE weakness. Patient able to tolerate short distance ambulation using RW without difficulty though excessive trunk flexion throughout, SpO2 maintained in upper 90s with exertion on RA, no subjective complaints. Patient will benefit from continued skilled physical therapy in hospital and recommended venue below to increase strength, balance, endurance for safe ADLs and gait.        If plan is discharge home, recommend the following: A little help with walking and/or transfers;Help with stairs or ramp for entrance;Assist for transportation   Can travel by private vehicle        Equipment Recommendations None recommended by PT  Recommendations for Other Services       Functional Status Assessment Patient has  had a recent decline in their functional status and demonstrates the ability to make significant improvements in function in a reasonable and predictable amount of time.     Precautions / Restrictions Precautions Precautions: Fall Recall of Precautions/Restrictions: Intact Restrictions Weight Bearing Restrictions Per Provider Order: No      Mobility  Bed Mobility Overal bed mobility: Needs Assistance Bed Mobility: Supine to Sit     Supine to sit: Modified independent (Device/Increase time), Used rails     General bed mobility comments: Labored movement, increased time    Transfers Overall transfer level: Needs assistance Equipment used: Rolling walker (2 wheels) Transfers: Sit to/from Stand, Bed to chair/wheelchair/BSC Sit to Stand: Contact guard assist   Step pivot transfers: Contact guard assist       General transfer comment: Labored movement, decreased STS initiation power, excessive forward lean to clear buttocks, observable valgus during standing likely secondary to LE weakness    Ambulation/Gait Ambulation/Gait assistance: Supervision Gait Distance (Feet): 100 Feet Assistive device: Rolling walker (2 wheels) Gait Pattern/deviations: Trunk flexed, Decreased step length - right, Decreased step length - left, Decreased stride length Gait velocity: Decreased     General Gait Details: Patient demonstrates good return to hallway/room ambluation without loss of balance, SpO2 maintained in upper 90s with exertion  Stairs            Wheelchair Mobility     Tilt Bed    Modified Rankin (Stroke Patients Only)       Balance Overall balance assessment: Needs assistance Sitting-balance support: No upper extremity supported, Feet supported Sitting balance-Leahy Scale: Good Sitting balance - Comments: seated EOB     Standing balance-Leahy Scale: Fair Standing balance comment: fair/good using RW  Pertinent  Vitals/Pain Pain Assessment Pain Assessment: No/denies pain    Home Living Family/patient expects to be discharged to:: Private residence Living Arrangements: Spouse/significant other Available Help at Discharge: Family Type of Home: House Home Access: Stairs to enter Entrance Stairs-Rails: Lawyer of Steps: 3 back Alternate Level Stairs-Number of Steps: flight Home Layout: Two level;Able to live on main level with bedroom/bathroom Home Equipment: Rolling Walker (2 wheels);Cane - single point;Wheelchair - Counsellor (4 wheels) Additional Comments: Per pt, no change in home living    Prior Function Prior Level of Function : Independent/Modified Independent             Mobility Comments: Short distance ambulator and community ambulator with RW when alone, SPC with help ADLs Comments: Reports independence with ADLs/IADLs     Extremity/Trunk Assessment        Lower Extremity Assessment Lower Extremity Assessment: Generalized weakness    Cervical / Trunk Assessment Cervical / Trunk Assessment: Kyphotic  Communication   Communication Communication: No apparent difficulties    Cognition Arousal: Alert Behavior During Therapy: WFL for tasks assessed/performed   PT - Cognitive impairments: No apparent impairments                         Following commands: Intact       Cueing Cueing Techniques: Verbal cues, Tactile cues     General Comments      Exercises     Assessment/Plan    PT Assessment Patient needs continued PT services  PT Problem List Decreased strength;Decreased activity tolerance;Decreased balance;Decreased mobility       PT Treatment Interventions Therapeutic activities;Therapeutic exercise;Balance training;Functional mobility training;DME instruction;Patient/family education    PT Goals (Current goals can be found in the Care Plan section)  Acute Rehab PT Goals Patient Stated Goal: return  home PT Goal Formulation: With patient Time For Goal Achievement: 01/17/24 Potential to Achieve Goals: Good    Frequency Min 3X/week     Co-evaluation               AM-PAC PT 6 Clicks Mobility  Outcome Measure Help needed turning from your back to your side while in a flat bed without using bedrails?: A Little Help needed moving from lying on your back to sitting on the side of a flat bed without using bedrails?: A Little Help needed moving to and from a bed to a chair (including a wheelchair)?: A Little Help needed standing up from a chair using your arms (e.g., wheelchair or bedside chair)?: A Little Help needed to walk in hospital room?: A Little Help needed climbing 3-5 steps with a railing? : A Lot 6 Click Score: 17    End of Session Equipment Utilized During Treatment: Gait belt Activity Tolerance: Patient tolerated treatment well Patient left: in chair;with call bell/phone within reach Nurse Communication: Mobility status PT Visit Diagnosis: Unsteadiness on feet (R26.81);Other abnormalities of gait and mobility (R26.89);Muscle weakness (generalized) (M62.81)    Time: 8949-8880 PT Time Calculation (min) (ACUTE ONLY): 29 min   Charges:   PT Evaluation $PT Eval Moderate Complexity: 1 Mod PT Treatments $Therapeutic Activity: 23-37 mins PT General Charges $$ ACUTE PT VISIT: 1 Visit        2:50 PM, 01/03/24,  Shanetra Blumenstock, SPT

## 2024-01-03 NOTE — Progress Notes (Addendum)
 SATURATION QUALIFICATIONS: (This note is used to comply with regulatory documentation for home oxygen )  Patient Saturations on Room Air at Rest = 88%  Patient Saturations on Room Air while Ambulating = 83%  Patient Saturations on 2 Liters of oxygen  while Ambulating = 93%  Please briefly explain why patient needs home oxygen :  Pt desaturated intermittently with in bed and with exertion/ambulation.

## 2024-01-03 NOTE — Plan of Care (Signed)

## 2024-01-03 NOTE — Care Management Important Message (Signed)
 Important Message  Patient Details  Name: Derrick Bender MRN: [id number] Date of Birth: 05-Dec-1945   Important Message Given:  Yes - Medicare IM     [name] [name] 01/03/2024, 9:47 AM

## 2024-01-03 NOTE — Progress Notes (Addendum)
 Rounding Note   Patient Name: FREEMON BINFORD Date of Encounter: 01/03/2024  [facility] Cardiologist: Alvan Carrier, MD   Subjective No complaints  Scheduled Meds:  apixaban   5 mg Oral BID   Chlorhexidine  Gluconate Cloth  6 each Topical Daily   donepezil   5 mg Oral Daily   fluticasone  furoate-vilanterol  1 puff Inhalation Daily   furosemide   40 mg Oral BID   metoprolol  succinate  25 mg Oral BID   mirtazapine   7.5 mg Oral QHS   sacubitril -valsartan   1 tablet Oral BID   simvastatin   10 mg Oral Daily   spironolactone   12.5 mg Oral Daily   tamsulosin   0.4 mg Oral Daily   Continuous Infusions:  PRN Meds: acetaminophen  **OR** acetaminophen , ALPRAZolam , hydrALAZINE , ipratropium-albuterol , ondansetron  **OR** ondansetron  (ZOFRAN ) IV   Vital Signs  Vitals:   01/02/24 2229 01/03/24 0700 01/03/24 0738 01/03/24 0821  BP: (!) 158/89 (!) 141/74    Pulse: 84     Resp: (!) 22 (!) 22    Temp:   98 F (36.7 C)   TempSrc:   Oral   SpO2: 98%   95%  Weight:      Height:        Intake/Output Summary (Last 24 hours) at 01/03/2024 0836 Last data filed at 01/02/2024 2125 Gross per 24 hour  Intake --  Output 1300 ml  Net -1300 ml      01/02/2024    5:00 AM 01/01/2024    5:00 AM 12/31/2023    8:32 AM  Last 3 Weights  Weight (lbs) 182 lb 15.7 oz 183 lb 6.8 oz 196 lb  Weight (kg) 83 kg 83.2 kg 88.905 kg      Telemetry SR, PVCs - Personally Reviewed  ECG  N/a - Personally Reviewed  Physical Exam  GEN: No acute distress.   Neck: No JVD Cardiac: RRR, no murmurs, rubs, or gallops.  Respiratory: Clear to auscultation bilaterally. GI: Soft, nontender, non-distended  MS: No edema; No deformity. Neuro:  Nonfocal  Psych: Normal affect   Labs High Sensitivity Troponin:   Recent Labs  Lab 12/31/23 0853 12/31/23 1048  TROPONINIHS 13 9     Chemistry Recent Labs  Lab 12/31/23 0853 12/31/23 1048 01/01/24 0455 01/02/24 0242 01/03/24 0307  NA 148*  --  147*  145 142  K 3.5  --  3.1* 4.1 3.8  CL 109  --  103 104 101  CO2 28  --  31 32 30  GLUCOSE 84  --  125* 106* 110*  BUN 25*  --  25* 27* 25*  CREATININE 1.24  --  1.31* 1.49* 1.29*  CALCIUM  8.3*  --  8.0* 8.5* 8.5*  MG  --  2.1  --   --   --   PROT 6.1*  --   --   --   --   ALBUMIN 3.2*  --   --   --   --   AST 14*  --   --   --   --   ALT 11  --   --   --   --   ALKPHOS 71  --   --   --   --   BILITOT 0.8  --   --   --   --   GFRNONAA 60*  --  56* 48* 57*  ANIONGAP 11  --  13 9 11     Lipids No results for input(s): CHOL, TRIG, HDL, LABVLDL, LDLCALC, CHOLHDL  in the last 168 hours.  Hematology Recent Labs  Lab 12/31/23 0853 01/01/24 0455  WBC 8.8 8.4  RBC 4.79 5.56  HGB 11.5* 13.1  HCT 39.4 45.1  MCV 82.3 81.1  MCH 24.0* 23.6*  MCHC 29.2* 29.0*  RDW 23.9* 24.0*  PLT 226 254   Thyroid   Recent Labs  Lab 12/31/23 1048  TSH 0.964    BNP Recent Labs  Lab 12/31/23 0853  BNP 1,757.0*    DDimer No results for input(s): DDIMER in the last 168 hours.   Radiology  ECHOCARDIOGRAM COMPLETE Result Date: 01/01/2024    ECHOCARDIOGRAM REPORT   Patient Name:   ABDULRAHMAN BRACEY Date of Exam: 01/01/2024 Medical Rec #:  [id number]        Height:       69.0 in Accession #:    [id number]       Weight:       183.4 lb Date of Birth:  01-Oct-1945        BSA:          1.991 m Patient Age:    77 years         BP:           146/76 mmHg Patient Gender: M                HR:           77 bpm. Exam Location:  [facility] Procedure: 2D Echo, Cardiac Doppler, Color Doppler and Intracardiac            Opacification Agent (Both Spectral and Color Flow Doppler were            utilized during procedure). Indications:    Dyspnea R06.00  History:        Patient has prior history of Echocardiogram examinations, most                 recent 09/27/2021. Risk Factors:Hypertension, Diabetes,                 Dyslipidemia and Former Smoker.  Sonographer:    Aida Pizza RCS Referring Phys: [id number] DORN FALCON  Yavuz Kirby IMPRESSIONS  1. Left ventricular ejection fraction, by estimation, is 40 to 45%. The left ventricle has mildly decreased function. The left ventricle demonstrates global hypokinesis. There is severe left ventricular hypertrophy. Left ventricular diastolic parameters  are consistent with Grade I diastolic dysfunction (impaired relaxation).  2. Right ventricular systolic function is normal. The right ventricular size is normal. Tricuspid regurgitation signal is inadequate for assessing PA pressure.  3. The mitral valve is normal in structure. Mild mitral valve regurgitation. No evidence of mitral stenosis.  4. The aortic valve is tricuspid. There is mild calcification of the aortic valve. There is mild thickening of the aortic valve. Aortic valve regurgitation is mild to moderate. No aortic stenosis is present. FINDINGS  Left Ventricle: Left ventricular ejection fraction, by estimation, is 40 to 45%. The left ventricle has mildly decreased function. The left ventricle demonstrates global hypokinesis. Definity  contrast agent was given IV to delineate the left ventricular  endocardial borders. The left ventricular internal cavity size was normal in size. There is severe left ventricular hypertrophy. Left ventricular diastolic parameters are consistent with Grade I diastolic dysfunction (impaired relaxation). Normal left ventricular filling pressure. Right Ventricle: The right ventricular size is normal. Right vetricular wall thickness was not well visualized. Right ventricular systolic function is normal. Tricuspid regurgitation signal is inadequate for assessing PA pressure. Left Atrium:  Left atrial size was normal in size. Right Atrium: Right atrial size was normal in size. Pericardium: There is no evidence of pericardial effusion. Mitral Valve: The mitral valve is normal in structure. There is mild thickening of the mitral valve leaflet(s). There is mild calcification of the mitral valve leaflet(s). Mild  mitral annular calcification. Mild mitral valve regurgitation. No evidence of  mitral valve stenosis. Tricuspid Valve: The tricuspid valve is normal in structure. Tricuspid valve regurgitation is trivial. No evidence of tricuspid stenosis. Aortic Valve: The aortic valve is tricuspid. There is mild calcification of the aortic valve. There is mild thickening of the aortic valve. There is mild aortic valve annular calcification. Aortic valve regurgitation is mild to moderate. Aortic regurgitation PHT measures 895 msec. No aortic stenosis is present. Aortic valve mean gradient measures 7.0 mmHg. Aortic valve peak gradient measures 13.0 mmHg. Aortic valve area, by VTI measures 1.74 cm. Pulmonic Valve: The pulmonic valve was not well visualized. Pulmonic valve regurgitation is not visualized. No evidence of pulmonic stenosis. Aorta: The aortic root is normal in size and structure. Venous: The inferior vena cava was not well visualized. IAS/Shunts: No atrial level shunt detected by color flow Doppler.  LEFT VENTRICLE PLAX 2D LVIDd:         5.10 cm      Diastology LVIDs:         3.90 cm      LV e' medial:    4.85 cm/s LV PW:         1.60 cm      LV E/e' medial:  14.2 LV IVS:        1.60 cm      LV e' lateral:   5.35 cm/s LVOT diam:     2.10 cm      LV E/e' lateral: 12.9 LV SV:         70 LV SV Index:   35 LVOT Area:     3.46 cm  LV Volumes (MOD) LV vol d, MOD A4C: 111.0 ml LV vol s, MOD A4C: 68.4 ml LV SV MOD A4C:     111.0 ml RIGHT VENTRICLE RV S prime:     17.30 cm/s TAPSE (M-mode): 2.5 cm LEFT ATRIUM             Index        RIGHT ATRIUM           Index LA diam:        3.70 cm 1.86 cm/m   RA Area:     21.10 cm LA Vol (A2C):   71.1 ml 35.70 ml/m  RA Volume:   61.70 ml  30.98 ml/m LA Vol (A4C):   49.2 ml 24.71 ml/m LA Biplane Vol: 57.5 ml 28.87 ml/m  AORTIC VALVE AV Area (Vmax):    1.72 cm AV Area (Vmean):   1.69 cm AV Area (VTI):     1.74 cm AV Vmax:           180.00 cm/s AV Vmean:          121.000 cm/s AV VTI:             0.404 m AV Peak Grad:      13.0 mmHg AV Mean Grad:      7.0 mmHg LVOT Vmax:         89.20 cm/s LVOT Vmean:        58.950 cm/s LVOT VTI:          0.202 m  LVOT/AV VTI ratio: 0.50 AI PHT:            895 msec  AORTA Ao Root diam: 3.50 cm MITRAL VALVE MV Area (PHT): 2.95 cm     SHUNTS MV Decel Time: 257 msec     Systemic VTI:  0.20 m MV E velocity: 68.80 cm/s   Systemic Diam: 2.10 cm MV A velocity: 106.00 cm/s MV E/A ratio:  0.65 Dorn Ross MD Electronically signed by Dorn Ross MD Signature Date/Time: 01/01/2024/3:30:11 PM    Final     Cardiac Studies   Patient Profile   RITHY MANDLEY is a 78 y.o. male with a hx of HTN, HLD, OSA, chronic HFrEF (EF in 2017 65 to 60%, in 2023 60 to 65%; Most recent EF 25-30% in 08/2023 at [facility]), paroxysmal atrial fibrillation (Dx at [facility] 08/2023 with no A-fib detected on ZIO monitor in 09/2023), hx of DVT in 02/2023 (On Eliquis ), Hx of TIA, COPD and leg edema who is being seen 12/31/2023 for the evaluation of Acute on chronic HFrEF and HTN emergency with pulmonary edema at the request of Dr. Charlyn.      Assessment & Plan   1.Acute on chronic HFrEF - 09/2021 echo: LVEF 60-65%, grade I dd, normal RV - 08/2023 echo [facility]: LVEF 25-30% - 12/2023 echo: LVE 40-45%, grade I dd. NOrmal RV function, mild MR - during May admission at [facility] newly diagnosed with HFrEF. STarted on toprol , entresto , jardiance.  - also diagnosed with afib during admission, from notes some thought may be tachy mediated   - CXR vascular congestion, BNP 1757 - presented volume overloaded in setting of severe HTN, SBP in the 200s - received IV 40mg  x 1 yesterday, changed to oral lasix . Would plan for 40mg  once daily.. I/Os incomplete. Weight is pending. Mild variation in Cr without clear trend.  - would ambulate patient today and assess symptoms, appears near euvolemic.   - medical therapy with entrestro 49/51 mg bid, toprol  25mg  bid, aldactone  12.5mg  daily From note SGLT2i was stopped  for unexplained weightl loss - LVEF is improving, advanced age with some physical limitations. 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 Would hold on ischemic testing at this time, continue titration of medical therapy and continue to follow LV function.     2. HTN emergency - bp's 230s/100s in setting of pulmonary edema, acute on chronic HFrEF - started on NG drip now weaned off - up and down bp's, follow trend today.      3. Afib - diagnosed during 08/2023 admission at [facility] - EKG this admit shows NSR - has been on eliquis  for stroke prevention, toprol  for rate control       4. History of DVT - from [facility] DVT diagnosed Jan 2025 due to immobility - he is on eliquis    5. OSA - CPAP at night during admission, needs help arraning outpatient cpap. Reports his home machine has worn out.     Possible discharge today, would have him ambulate with PT   For questions or updates, please contact [facility] Please consult [email or web address] for contact info under       Signed, Ross Dorn, MD  01/03/2024, 8:36 AM

## 2024-01-03 NOTE — Discharge Summary (Signed)
 Physician Discharge Summary   Patient: Derrick Bender MRN: [id number] DOB: October 03, 1945  Admit date:     12/31/2023  Discharge date: 01/03/24  Discharge Physician: Derrick Bender   PCP: Derrick Leta NOVAK, MD   Recommendations at discharge:  Repeat basic metabolic panel to follow ultralights renal function Repeat CBC to follow hemoglobin trend/stability Reassess desaturation screening and the need for oxygen  supplementation at follow-up visit Continue assisting patient as needed for acquiring replacement CPAP machine to use nightly. Reassess blood pressure and further adjust antihypertensive regimen.  Discharge Diagnoses: Principal Problem:   Acute on chronic systolic (congestive) heart failure (HCC) Active Problems:   Hypertensive emergency   PAF (paroxysmal atrial fibrillation) (HCC)   PVC's (premature ventricular contractions) History of COPD/asthma Prior history of DVT History of BPH Hyperlipidemia History of obstructive sleep apnea Hyperglycemia/history of type 2 diabetes Hypokalemia History of cognitive impairment.  Brief hospital admission narrative: Derrick Bender is a 78 y.o. male with medical history significant of systolic heart failure, COPD/asthma, history of DVT on chronic Eliquis , depression, diabetes, hyperlipidemia, hypertension, obstructive sleep apnea and BPH; who presented to the hospital secondary to increased shortness of breath, orthopnea and dyspnea on exertion. Patient reported to have been present for the last 4-5 days and worsening.  He has present no fever, no chills, no chest pain, no sick contacts, no abdominal pain, no nausea/vomiting, no dysuria/hematuria or any complaints of melena or hematochezia.   Patient has noticed an increase with weight and also lower extremity swelling.   Workup in the ED demonstrating hypertensive urgency with systolic blood pressure above 799, increased BNP, chest x-ray with vascular congestion and interstitial edema.    Case discussed with cardiology service who recommended initiation of nitroglycerin  drip to assist with blood pressure control, IV diuresis, obtain 2D echo and follow clinical response.  [facility] has been contacted to place patient in the hospital for further evaluation and management.    Assessment and Plan: 1-acute on chronic systolic heart failure - Continue daily weights, adequate hydration and low-sodium diet has been encouraged. - Will continue IV diuresis and follow clinical response; 2D echo demonstrated ejection fraction of 40% - Appreciate assistance and recommendation regarding further GDMT by cardiology service. - Will continue metoprolol  and Entresto ; adjustment on last will be followed as per cardiology recommendations (looking to be increased to 49-51 mg twice a day); patient also has been discharged on a spironolactone  12.5 mg daily.   2-history of COPD/asthma - No wheezing or signs of acute exacerbation currently appreciated - Continue bronchodilator management.   3-hypertension urgency/emergency - Excellent response and stabilization with the use of nitroglycerin  drip - Patient adequately transitioned off nitroglycerin  drip and now using adjusted dose of Entresto , metoprolol , spironolactone , Lasix  and Flomax  for rate control - Heart healthy/low-sodium diet discussed with patient - Reassess blood pressure and adjust medication as needed.   4-history of BPH - Continue Flomax . - No complaints of urinary retention reported.   5-prior history of DVT - Continue Eliquis .   6-history of depression/anxiety - No suicidal ideation or hallucination - Stable mood - Continue treatment with Remeron  and as needed Xanax .   7-hyperlipidemia - Continue statin.   8-history of obstructive sleep apnea - Continue CPAP nightly   9-history of cognitive impairment - Stable - Continue considering additional supportive care - Continue home Aricept .   10-hyperglycemia/history of type 2  diabetes - A1c 5.1 - Continue diet management.   11-hypokalemia - In the setting of diuresis - Repleted and within normal  limits at discharge.  12-acute respiratory failure with hypoxia - In the setting of underlying COPD, CHF and obstructive sleep apnea - Delirium and psychological mentation demonstrated to be needed to maintain saturation above 90% - Home oxygen  has been arranged at time of discharge. - Reassess desaturations screening and further need of  supplementation.  Consultants: Cardiology service Procedures performed: See below for x-ray report; 2D echo Disposition: Home with home health PT Diet recommendation: Heart healthy/low-sodium and modified carbohydrates diet  DISCHARGE MEDICATION: Allergies as of 01/03/2024       Reactions   Sulfamethoxazole Dermatitis        Medication List     STOP taking these medications    ciprofloxacin 500 MG tablet Commonly known as: CIPRO   loperamide  2 MG tablet Commonly known as: Imodium  A-D   oxybutynin  5 MG tablet Commonly known as: DITROPAN    predniSONE  10 MG tablet Commonly known as: DELTASONE        TAKE these medications    ALPRAZolam  0.25 MG tablet Commonly known as: XANAX  Take 1 tablet (0.25 mg total) by mouth every 12 (twelve) hours as needed. What changed:  when to take this Another medication with the same name was removed. Continue taking this medication, and follow the directions you see here.   donepezil  5 MG tablet Commonly known as: ARICEPT  Take 5 mg by mouth daily.   Eliquis  5 MG Tabs tablet Generic drug: apixaban  Take 5 mg by mouth 2 (two) times daily.   furosemide  40 MG tablet Commonly known as: LASIX  Take 1 tablet (40 mg total) by mouth daily. What changed:  medication strength how much to take   Melatonin 10 MG Tabs Take 10 mg by mouth at bedtime.   metoprolol  succinate 25 MG 24 hr tablet Commonly known as: TOPROL -XL Take 1 tablet (25 mg total) by mouth in the morning and at  bedtime.   mirtazapine  7.5 MG tablet Commonly known as: REMERON  Take 7.5 mg by mouth at bedtime.   ProAir  HFA 108 (90 Base) MCG/ACT inhaler Generic drug: albuterol  Inhale 2-3 puffs into the lungs every 6 (six) hours as needed.   sacubitril -valsartan  49-51 MG Commonly known as: ENTRESTO  Take 1 tablet by mouth 2 (two) times daily.   simvastatin  10 MG tablet Commonly known as: ZOCOR  Take 10 mg by mouth daily.   spironolactone  25 MG tablet Commonly known as: ALDACTONE  Take 0.5 tablets (12.5 mg total) by mouth daily. Start taking on: January 04, 2024   Symbicort 160-4.5 MCG/ACT inhaler Generic drug: budesonide-formoterol  Inhale 2 puffs into the lungs daily.   tamsulosin  0.4 MG Caps capsule Commonly known as: FLOMAX  Take 0.4 mg by mouth daily.   topiramate  100 MG tablet Commonly known as: TOPAMAX  Take 100 mg by mouth 2 (two) times daily.               Durable Medical Equipment  (From admission, onward)           Start     Ordered   01/03/24 1337  For home use only DME oxygen   Once       Comments: For COPD and CHF  Question Answer Comment  Length of Need 6 Months   Mode or (Route) Nasal cannula   Liters per Minute 2   Frequency Continuous (stationary and portable oxygen  unit needed)   Oxygen  conserving device Yes   Oxygen  delivery system Gas      01/03/24 1336            Follow-up  Information     [facility]. Go on 01/09/2024.   Specialty: Cardiology Why: [facility] Follow-Up 01/09/24 @ 1200 [facility] [address],[place] [facility], [address], Second Floor Please bring all medications with you to follow-up appointment Free Valet Parking at the door Contact information: [address] [place]  [address] [phone]        Catherine Cools, MD. Go on 01/21/2024.   Specialties: Pulmonary Disease, Internal Medicine Why:  Appointment at 8:30 and please arrvie by 8:15. Contact information: [address] [place] [address] [phone]         Derrick Leta NOVAK, MD. Schedule an appointment as soon as possible for a visit in 10 day(s).   Specialty: Internal Medicine Contact information: [address] [place] [address] [phone]                Discharge Exam: Filed Weights   12/31/23 0832 01/01/24 0500 01/02/24 0500  Weight: 88.9 kg 83.2 kg 83 kg   General exam: Alert, awake, oriented x 3; reporting no chest pain, no nausea, no vomiting, no orthopnea and feeling ready to go home. Respiratory system:  2 L nasal cannula in place.  Improved air movement appreciated bilaterally; no using accessory muscle.  No frank crackles. Cardiovascular system: Rate controlled; PVCs appreciated on telemetry.  No rubs, no gallops and no JVD present on exam. Gastrointestinal system: Abdomen is nondistended, soft and nontender. No organomegaly or masses felt. Normal bowel sounds heard. Central nervous system: No focal neurological deficits. Extremities: No cyanosis or clubbing.  No edema appreciated on exam. Skin: No petechiae. Psychiatry: Judgement and insight appear normal. Mood & affect appropriate.   Condition at discharge: Stable and improved.  The results of significant diagnostics from this hospitalization (including imaging, microbiology, ancillary and laboratory) are listed below for reference.   Imaging Studies: ECHOCARDIOGRAM COMPLETE Result Date: 01/01/2024    ECHOCARDIOGRAM REPORT   Patient Name:   VASILY FEDEWA Date of Exam: 01/01/2024 Medical Rec #:  [id number]        Height:       69.0 in Accession #:    [id number]       Weight:       183.4 lb Date of Birth:  1945/11/10        BSA:          1.991 m Patient Age:    77 years         BP:           146/76 mmHg Patient Gender: M                HR:           77 bpm. Exam Location:  [facility] Procedure: 2D Echo, Cardiac Doppler,  Color Doppler and Intracardiac            Opacification Agent (Both Spectral and Color Flow Doppler were            utilized during procedure). Indications:    Dyspnea R06.00  History:        Patient has prior history of Echocardiogram examinations, most                 recent 09/27/2021. Risk Factors:Hypertension, Diabetes,                 Dyslipidemia and Former Smoker.  Sonographer:    Aida Pizza  RCS Referring Phys: [id number] DORN FALCON BRANCH IMPRESSIONS  1. Left ventricular ejection fraction, by estimation, is 40 to 45%. The left ventricle has mildly decreased function. The left ventricle demonstrates global hypokinesis. There is severe left ventricular hypertrophy. Left ventricular diastolic parameters  are consistent with Grade I diastolic dysfunction (impaired relaxation).  2. Right ventricular systolic function is normal. The right ventricular size is normal. Tricuspid regurgitation signal is inadequate for assessing PA pressure.  3. The mitral valve is normal in structure. Mild mitral valve regurgitation. No evidence of mitral stenosis.  4. The aortic valve is tricuspid. There is mild calcification of the aortic valve. There is mild thickening of the aortic valve. Aortic valve regurgitation is mild to moderate. No aortic stenosis is present. FINDINGS  Left Ventricle: Left ventricular ejection fraction, by estimation, is 40 to 45%. The left ventricle has mildly decreased function. The left ventricle demonstrates global hypokinesis. Definity  contrast agent was given IV to delineate the left ventricular  endocardial borders. The left ventricular internal cavity size was normal in size. There is severe left ventricular hypertrophy. Left ventricular diastolic parameters are consistent with Grade I diastolic dysfunction (impaired relaxation). Normal left ventricular filling pressure. Right Ventricle: The right ventricular size is normal. Right vetricular wall thickness was not well visualized. Right ventricular  systolic function is normal. Tricuspid regurgitation signal is inadequate for assessing PA pressure. Left Atrium: Left atrial size was normal in size. Right Atrium: Right atrial size was normal in size. Pericardium: There is no evidence of pericardial effusion. Mitral Valve: The mitral valve is normal in structure. There is mild thickening of the mitral valve leaflet(s). There is mild calcification of the mitral valve leaflet(s). Mild mitral annular calcification. Mild mitral valve regurgitation. No evidence of  mitral valve stenosis. Tricuspid Valve: The tricuspid valve is normal in structure. Tricuspid valve regurgitation is trivial. No evidence of tricuspid stenosis. Aortic Valve: The aortic valve is tricuspid. There is mild calcification of the aortic valve. There is mild thickening of the aortic valve. There is mild aortic valve annular calcification. Aortic valve regurgitation is mild to moderate. Aortic regurgitation PHT measures 895 msec. No aortic stenosis is present. Aortic valve mean gradient measures 7.0 mmHg. Aortic valve peak gradient measures 13.0 mmHg. Aortic valve area, by VTI measures 1.74 cm. Pulmonic Valve: The pulmonic valve was not well visualized. Pulmonic valve regurgitation is not visualized. No evidence of pulmonic stenosis. Aorta: The aortic root is normal in size and structure. Venous: The inferior vena cava was not well visualized. IAS/Shunts: No atrial level shunt detected by color flow Doppler.  LEFT VENTRICLE PLAX 2D LVIDd:         5.10 cm      Diastology LVIDs:         3.90 cm      LV e' medial:    4.85 cm/s LV PW:         1.60 cm      LV E/e' medial:  14.2 LV IVS:        1.60 cm      LV e' lateral:   5.35 cm/s LVOT diam:     2.10 cm      LV E/e' lateral: 12.9 LV SV:         70 LV SV Index:   35 LVOT Area:     3.46 cm  LV Volumes (MOD) LV vol d, MOD A4C: 111.0 ml LV vol s, MOD A4C: 68.4 ml LV SV MOD A4C:  111.0 ml RIGHT VENTRICLE RV S prime:     17.30 cm/s TAPSE (M-mode): 2.5 cm  LEFT ATRIUM             Index        RIGHT ATRIUM           Index LA diam:        3.70 cm 1.86 cm/m   RA Area:     21.10 cm LA Vol (A2C):   71.1 ml 35.70 ml/m  RA Volume:   61.70 ml  30.98 ml/m LA Vol (A4C):   49.2 ml 24.71 ml/m LA Biplane Vol: 57.5 ml 28.87 ml/m  AORTIC VALVE AV Area (Vmax):    1.72 cm AV Area (Vmean):   1.69 cm AV Area (VTI):     1.74 cm AV Vmax:           180.00 cm/s AV Vmean:          121.000 cm/s AV VTI:            0.404 m AV Peak Grad:      13.0 mmHg AV Mean Grad:      7.0 mmHg LVOT Vmax:         89.20 cm/s LVOT Vmean:        58.950 cm/s LVOT VTI:          0.202 m LVOT/AV VTI ratio: 0.50 AI PHT:            895 msec  AORTA Ao Root diam: 3.50 cm MITRAL VALVE MV Area (PHT): 2.95 cm     SHUNTS MV Decel Time: 257 msec     Systemic VTI:  0.20 m MV E velocity: 68.80 cm/s   Systemic Diam: 2.10 cm MV A velocity: 106.00 cm/s MV E/A ratio:  0.65 Dorn Ross MD Electronically signed by Dorn Ross MD Signature Date/Time: 01/01/2024/3:30:11 PM    Final    DG Chest Port 1 View Result Date: 12/31/2023 CLINICAL DATA:  Dyspnea. EXAM: PORTABLE CHEST 1 VIEW COMPARISON:  04/19/2023 FINDINGS: Cardiomediastinal silhouette is upper limits of normal but stable. Mildly prominent interstitial and vascular markings. Slightly increased densities in the mid and lower left chest. Difficult to exclude mild blunting at the right costophrenic angle. Negative for a pneumothorax. No acute bone abnormality. IMPRESSION: 1. Mildly prominent interstitial and vascular markings. Findings could represent mild vascular congestion. 2. Slightly increased densities in the left lower lung are nonspecific. Developing infection cannot be excluded. Electronically Signed   By: Juliene Balder M.D.   On: 12/31/2023 09:10    Microbiology: Results for orders placed or performed during the hospital encounter of 12/31/23  Resp panel by RT-PCR (RSV, Flu A&B, Covid) Anterior Nasal Swab     Status: None   Collection Time: 12/31/23   9:00 AM   Specimen: Anterior Nasal Swab  Result Value Ref Range Status   SARS Coronavirus 2 by RT PCR NEGATIVE NEGATIVE Final    Comment: (NOTE) SARS-CoV-2 target nucleic acids are NOT DETECTED.  The SARS-CoV-2 RNA is generally detectable in upper respiratory specimens during the acute phase of infection. The lowest concentration of SARS-CoV-2 viral copies this assay can detect is 138 copies/mL. A negative result does not preclude SARS-Cov-2 infection and should not be used as the sole basis for treatment or other patient management decisions. A negative result may occur with  improper specimen collection/handling, submission of specimen other than nasopharyngeal swab, presence of viral mutation(s) within the areas targeted by this assay, and inadequate  number of viral copies(<138 copies/mL). A negative result must be combined with clinical observations, patient history, and epidemiological information. The expected result is Negative.  Fact Sheet for Patients:  BloggerCourse.com  Fact Sheet for Healthcare Providers:  SeriousBroker.it  This test is no t yet approved or cleared by the [place]  FDA and  has been authorized for detection and/or diagnosis of SARS-CoV-2 by FDA under an Emergency Use Authorization (EUA). This EUA will remain  in effect (meaning this test can be used) for the duration of the COVID-19 declaration under Section 564(b)(1) of the Act, 21 U.S.C.section 360bbb-3(b)(1), unless the authorization is terminated  or revoked sooner.       Influenza A by PCR NEGATIVE NEGATIVE Final   Influenza B by PCR NEGATIVE NEGATIVE Final    Comment: (NOTE) The Xpert Xpress SARS-CoV-2/FLU/RSV plus assay is intended as an aid in the diagnosis of influenza from Nasopharyngeal swab specimens and should not be used as a sole basis for treatment. Nasal washings and aspirates are unacceptable for Xpert Xpress  SARS-CoV-2/FLU/RSV testing.  Fact Sheet for Patients: BloggerCourse.com  Fact Sheet for Healthcare Providers: SeriousBroker.it  This test is not yet approved or cleared by the [place]  FDA and has been authorized for detection and/or diagnosis of SARS-CoV-2 by FDA under an Emergency Use Authorization (EUA). This EUA will remain in effect (meaning this test can be used) for the duration of the COVID-19 declaration under Section 564(b)(1) of the Act, 21 U.S.C. section 360bbb-3(b)(1), unless the authorization is terminated or revoked.     Resp Syncytial Virus by PCR NEGATIVE NEGATIVE Final    Comment: (NOTE) Fact Sheet for Patients: BloggerCourse.com  Fact Sheet for Healthcare Providers: SeriousBroker.it  This test is not yet approved or cleared by the [place]  FDA and has been authorized for detection and/or diagnosis of SARS-CoV-2 by FDA under an Emergency Use Authorization (EUA). This EUA will remain in effect (meaning this test can be used) for the duration of the COVID-19 declaration under Section 564(b)(1) of the Act, 21 U.S.C. section 360bbb-3(b)(1), unless the authorization is terminated or revoked.  Performed at [facility], [address], [place], [place] [address]   MRSA Next Gen by PCR, Nasal     Status: None   Collection Time: 12/31/23  5:55 PM   Specimen: Nasal Mucosa; Nasal Swab  Result Value Ref Range Status   MRSA by PCR Next Gen NOT DETECTED NOT DETECTED Final    Comment: (NOTE) The GeneXpert MRSA Assay (FDA approved for NASAL specimens only), is one component of a comprehensive MRSA colonization surveillance program. It is not intended to diagnose MRSA infection nor to guide or monitor treatment for MRSA infections. Test performance is not FDA approved in patients less than 64 years old. Performed at [facility], [address] [place],  [place] [address]     Labs: CBC: Recent Labs  Lab 12/31/23 0853 01/01/24 0455  WBC 8.8 8.4  NEUTROABS 5.2  --   HGB 11.5* 13.1  HCT 39.4 45.1  MCV 82.3 81.1  PLT 226 254   Basic Metabolic Panel: Recent Labs  Lab 12/31/23 0853 12/31/23 1048 01/01/24 0455 01/02/24 0242 01/03/24 0307  NA 148*  --  147* 145 142  K 3.5  --  3.1* 4.1 3.8  CL 109  --  103 104 101  CO2 28  --  31 32 30  GLUCOSE 84  --  125* 106* 110*  BUN 25*  --  25* 27* 25*  CREATININE  1.24  --  1.31* 1.49* 1.29*  CALCIUM  8.3*  --  8.0* 8.5* 8.5*  MG  --  2.1  --   --   --   PHOS  --  2.6  --   --   --    Liver Function Tests: Recent Labs  Lab 12/31/23 0853  AST 14*  ALT 11  ALKPHOS 71  BILITOT 0.8  PROT 6.1*  ALBUMIN 3.2*   CBG: Recent Labs  Lab 12/31/23 0832  GLUCAP 78    Discharge time spent:  35 minutes.  Signed: Eric Nunnery, MD [facility] 01/03/2024

## 2024-01-03 NOTE — Plan of Care (Signed)
  Problem: Acute Rehab PT Goals(only PT should resolve) Goal: Pt Will Go Supine/Side To Sit Outcome: Progressing Flowsheets (Taken 01/03/2024 1451) Pt will go Supine/Side to Sit: with modified independence Note: Without use of bed rails Goal: Patient Will Transfer Sit To/From Stand Outcome: Progressing Flowsheets (Taken 01/03/2024 1451) Patient will transfer sit to/from stand: with supervision Goal: Pt Will Transfer Bed To Chair/Chair To Bed Outcome: Progressing Flowsheets (Taken 01/03/2024 1451) Pt will Transfer Bed to Chair/Chair to Bed: with supervision Goal: Pt Will Ambulate Outcome: Progressing Flowsheets (Taken 01/03/2024 1451) Pt will Ambulate:  with modified independence  > 125 feet  with rolling walker

## 2024-01-04 DIAGNOSIS — E876 Hypokalemia: Secondary | ICD-10-CM | POA: Diagnosis not present

## 2024-01-04 DIAGNOSIS — I110 Hypertensive heart disease with heart failure: Secondary | ICD-10-CM | POA: Diagnosis not present

## 2024-01-04 DIAGNOSIS — I5023 Acute on chronic systolic (congestive) heart failure: Secondary | ICD-10-CM | POA: Diagnosis not present

## 2024-01-04 DIAGNOSIS — I480 Paroxysmal atrial fibrillation: Secondary | ICD-10-CM | POA: Diagnosis not present

## 2024-01-04 DIAGNOSIS — G459 Transient cerebral ischemic attack, unspecified: Secondary | ICD-10-CM | POA: Diagnosis not present

## 2024-01-04 DIAGNOSIS — E1165 Type 2 diabetes mellitus with hyperglycemia: Secondary | ICD-10-CM | POA: Diagnosis not present

## 2024-01-04 DIAGNOSIS — J9601 Acute respiratory failure with hypoxia: Secondary | ICD-10-CM | POA: Diagnosis not present

## 2024-01-09 ENCOUNTER — Encounter: Admitting: Family

## 2024-01-10 ENCOUNTER — Encounter: Payer: Self-pay | Admitting: Nurse Practitioner

## 2024-01-10 ENCOUNTER — Ambulatory Visit: Attending: Nurse Practitioner | Admitting: Nurse Practitioner

## 2024-01-10 VITALS — BP 122/76 | HR 61 | Ht 69.0 in | Wt 193.6 lb

## 2024-01-10 DIAGNOSIS — G4733 Obstructive sleep apnea (adult) (pediatric): Secondary | ICD-10-CM | POA: Diagnosis not present

## 2024-01-10 DIAGNOSIS — Z299 Encounter for prophylactic measures, unspecified: Secondary | ICD-10-CM | POA: Diagnosis not present

## 2024-01-10 DIAGNOSIS — E1165 Type 2 diabetes mellitus with hyperglycemia: Secondary | ICD-10-CM | POA: Diagnosis not present

## 2024-01-10 DIAGNOSIS — Z09 Encounter for follow-up examination after completed treatment for conditions other than malignant neoplasm: Secondary | ICD-10-CM | POA: Diagnosis not present

## 2024-01-10 DIAGNOSIS — E785 Hyperlipidemia, unspecified: Secondary | ICD-10-CM

## 2024-01-10 DIAGNOSIS — I480 Paroxysmal atrial fibrillation: Secondary | ICD-10-CM | POA: Diagnosis not present

## 2024-01-10 DIAGNOSIS — R531 Weakness: Secondary | ICD-10-CM

## 2024-01-10 DIAGNOSIS — I5022 Chronic systolic (congestive) heart failure: Secondary | ICD-10-CM

## 2024-01-10 DIAGNOSIS — J449 Chronic obstructive pulmonary disease, unspecified: Secondary | ICD-10-CM

## 2024-01-10 DIAGNOSIS — I10 Essential (primary) hypertension: Secondary | ICD-10-CM | POA: Diagnosis not present

## 2024-01-10 DIAGNOSIS — I110 Hypertensive heart disease with heart failure: Secondary | ICD-10-CM | POA: Diagnosis not present

## 2024-01-10 DIAGNOSIS — I4729 Other ventricular tachycardia: Secondary | ICD-10-CM | POA: Diagnosis not present

## 2024-01-10 DIAGNOSIS — I471 Supraventricular tachycardia, unspecified: Secondary | ICD-10-CM

## 2024-01-10 DIAGNOSIS — E876 Hypokalemia: Secondary | ICD-10-CM | POA: Diagnosis not present

## 2024-01-10 DIAGNOSIS — I5023 Acute on chronic systolic (congestive) heart failure: Secondary | ICD-10-CM | POA: Diagnosis not present

## 2024-01-10 DIAGNOSIS — Z2821 Immunization not carried out because of patient refusal: Secondary | ICD-10-CM | POA: Diagnosis not present

## 2024-01-10 NOTE — Patient Instructions (Addendum)
 Medication Instructions:  Your physician has recommended you make the following change in your medication:  Please Increase Lasix  to 40 Mg Twice daily for today and tomorrow, then on Saturday reduce back to 40 Mg daily   Labwork: In 1 week with [facility]   Testing/Procedures: None   Follow-Up: Your physician recommends that you schedule a follow-up appointment in: 4-6 weeks   Any Other Special Instructions Will Be Listed Below (If Applicable).  If you need a refill on your cardiac medications before your next appointment, please call your pharmacy. HEART FAILURE INSTRUCTION SHEET  Follow a low-salt diet-you are allowed no more than 2,000 mg of sodium per day. Watch your fluid intake. In general, you should not be taking more than 64 ounces a day (no more than 8 glasses per day). Sometimes we refer to this as 2 liters per day. This includes sources of water in food like soup, coffee, tea, milk etc. Weigh yourself on the same scale at the same time of the day preferably immediately after your first void. Keep a log of your weights. Call your doctor: (Anytime you feel any of the following symptoms)  3 lbs weight gain overnight or 5 lbs within a week Shortness of breath, with or without a day hacking cough Swelling in hands, feet or stomach If you have to sleep on extra pillows at night in order to breathe   IT IS IMPORTANT TO LET YOUR DOCTOR KNOW EARLY ON IF YOU ARE HAVING SYMPTOMS SO WE CAN HELP YOU!

## 2024-01-10 NOTE — Progress Notes (Signed)
 Cardiology Office Note   Date:  01/10/2024 ID:  Archimedes, Harold [birth year], MRN [id number] PCP: Rosamond Leta NOVAK, MD  [facility] Providers Cardiologist:  Alvan Carrier, MD     History of Present Illness HARDY HARCUM is a 78 y.o. male with a PMH of hypertension, OSA, hyperlipidemia, history of TIA, COPD, and leg edema, presents today for 4=6 week follow-up.    Appears he was diagnosed with a new onset A-fib at [facility] in May 2025, however AV ZIO monitor in June 2025 did not detect any A-fib.  He was predominantly in sinus rhythm with average heart rate of 71 bpm.  He did have occasional supraventricular ectopic beats, PACs with 2% burden, 24 episodes of SVT, longest lasting 16 beats at 125 bpm.  And occasional ventricular ectopies around 2% burden.  It was noted he had multiple episodes of wide-complex tachycardia-see full report below-longest episode lasted 15 seconds at 137 bpm.   12/06/2023-today he presents for follow-up with his daughter. He admits to DOE and admits to issues with his CPAP machine. Weights are coming down, admits to unintentional weight loss, daughter says he is not eating as much as he should. Sadly, wife has Lewy body dementia and is not getting enough rest as he is helping provide care for her. Daughter tells me that he is on Eliquis  due to previous DVT diagnosed along lower extremity at [facility] after back surgery performed in November 2024. Lower extremity doppler was performed in December 2024 at [facility] was positive for occlusive DVT throughout left femoral, popliteal, and calf veins. Daughter is not certain what dosage of Eliquis  he is on - believes he is on Eliquis  2.5 mg BID. Does admit to some palpations and DOE, difficult for him to describe fully. Denies any chest pain, syncope, presyncope, dizziness, orthopnea, PND, swelling or significant weight changes, acute bleeding, or claudication.  He was hospitalized in September 2025 for acute on chronic systolic heart  failure.  Hospital course also noted by acute respiratory failure with hypoxia in the setting of COPD, CHF, and OSA.  He presented with increased shortness of breath, orthopnea and DOE.  Workup in ED showed hypertensive urgency with SBP above 200, increased BNP, chest x-ray revealed vascular congestion with interstitial edema.  He was temporarily on nitroglycerin  drip to assist with BP control, received IV diuresis.  Echocardiogram showed EF of 40%.  GDMT was adjusted.  Today he presents for scheduled follow-up.  He tells me he has not felt well in the last 2 days, feeling little bit better today.  Chief concern is weakness and feels like he has not had his full strength come back since hospitalization.  This is his most bothersome symptom.  Overall doing well and tolerating medications well. Denies any chest pain, shortness of breath, palpitations, syncope, presyncope, dizziness, orthopnea, PND, swelling or significant weight changes, acute bleeding, or claudication.   ROS: Negative. See HPI.   Studies Reviewed  EKG: EKG is not ordered today.  Echo 01/01/2024:   1. Left ventricular ejection fraction, by estimation, is 40 to 45%. The  left ventricle has mildly decreased function. The left ventricle  demonstrates global hypokinesis. There is severe left ventricular  hypertrophy. Left ventricular diastolic parameters   are consistent with Grade I diastolic dysfunction (impaired relaxation).   2. Right ventricular systolic function is normal. The right ventricular  size is normal. Tricuspid regurgitation signal is inadequate for assessing  PA pressure.   3. The mitral valve is normal  in structure. Mild mitral valve  regurgitation. No evidence of mitral stenosis.   4. The aortic valve is tricuspid. There is mild calcification of the  aortic valve. There is mild thickening of the aortic valve. Aortic valve  regurgitation is mild to moderate. No aortic stenosis is present.    Zio monitor [facility]  [facility]) 09/2023:  Conclusions:  - Ambulatory ECG monitoring was performed from 09/16/23 to 09/30/23.  - The predominant rhythm was sinus rhythm, with the rate ranging from 42  to 117 and averaging 71 bpm when in sinus rhythm  - Occasional supraventricular ectopics (PACs) were recorded (2.0% burden)  with 24 episodes of SVT, longest lasting 16 beats at 125 bpm  - Occasional ventricular ectopics (PVCs) were recorded (1.8% burden) with  4003 episodes of wide-complex tachycardia, longest lasting 15.4 seconds at  137 bpm  - No patient-initiated recordings/events were submitted.  - No pauses >3 seconds or high-grade AV block detected  - No atrial fibrillation detected  Echocardiogram in May 2025 with [facility]:  EF 25 to 30%, mild AR, right ventricular was felt to be probably normal systolic function with moderate pulmonary hypertension.   Lexiscan  12/2015:  No diagnostic ST segment changes to indicate ischemia. Occasional PVCs. Small, mild intensity, inferior defect that is fixed and most prominent at rest, consistent with soft tissue attenuation. No large ischemic territories. This is a low risk study. Nuclear stress EF: 65%.      Physical Exam VS:  BP 122/76   Pulse 61   Ht 5' 9 (1.753 m)   Wt 193 lb 9.6 oz (87.8 kg)   SpO2 98%   BMI 28.59 kg/m        Wt Readings from Last 3 Encounters:  01/10/24 193 lb 9.6 oz (87.8 kg)  01/02/24 182 lb 15.7 oz (83 kg)  12/13/23 192 lb 12.8 oz (87.5 kg)    GEN: Well nourished, well developed in no acute distress NECK: No JVD; No carotid bruits CARDIAC: S1/S2, RRR, no murmurs, rubs, gallops RESPIRATORY:  Clear to auscultation without rales, wheezing or rhonchi  ABDOMEN: Soft, non-tender, non-distended EXTREMITIES:  No edema; No deformity   ASSESSMENT AND PLAN  HFmrEF Stage C, NYHA class I-II symptoms. EF 25-30% in May 2025 at [facility], with improvement in EF to 40 to 45% in September 2025.  Most recent BNP 1,757.  Weight is up slightly from last office  visit.  Will increase Lasix  to 40 mg twice daily x 2 days, then reduce Lasix  to 40 mg daily.  Will obtain proBNP, BMET, in 1 to 2 weeks.  He cannot tolerate Jardiance as this caused unintentional weight loss in the past. Continue rest of medication regimen. Low sodium diet, fluid restriction <2L, and daily weights encouraged. Educated to contact our office for weight gain of 2 lbs overnight or 5 lbs in one week.   PAF, PSVT, NSVT Denies any tachycardia or palpitations.  Heart rates well-controlled.  See most recent monitor report from [facility] above. Discussed avoiding triggers to A-fib.  No medication changes at this time.  HTN BP is stable. Discussed to monitor BP at home at least 2 hours after medications and sitting for 5-10 minutes. No med changes at this time. Heart healthy diet and regular cardiovascular exercise encouraged.   HLD LDL 135 08/2023. Not addressed at this visit, but plan to address at next visit. Heart healthy diet and regular cardiovascular exercise encouraged.   OSA, COPD Denies any recent shortness of breath or acute exacerbations.  Continue follow-up with pulmonology and encouraged him to continue compliance with CPAP machine.  6. Weakness Most likely related to deconditioning since hospitalization.  Recommended physical activity exercises he can do at home to gain back his strength.  Continue follow-up with PCP.   I spent a total duration of 30 minutes reviewing prior notes, reviewing outside records including  labs, face-to-face counseling of medical condition, pathophysiology, evaluation, management, and documenting the findings in the note.   Dispo: Care and ED precautions discussed. Follow-up with MD/APP in 4-6 weeks or sooner if anything changes.   Signed, Almarie Crate, NP

## 2024-01-14 DIAGNOSIS — I480 Paroxysmal atrial fibrillation: Secondary | ICD-10-CM | POA: Diagnosis not present

## 2024-01-14 DIAGNOSIS — J9601 Acute respiratory failure with hypoxia: Secondary | ICD-10-CM | POA: Diagnosis not present

## 2024-01-14 DIAGNOSIS — E1165 Type 2 diabetes mellitus with hyperglycemia: Secondary | ICD-10-CM | POA: Diagnosis not present

## 2024-01-14 DIAGNOSIS — I5023 Acute on chronic systolic (congestive) heart failure: Secondary | ICD-10-CM | POA: Diagnosis not present

## 2024-01-14 DIAGNOSIS — I110 Hypertensive heart disease with heart failure: Secondary | ICD-10-CM | POA: Diagnosis not present

## 2024-01-14 DIAGNOSIS — E876 Hypokalemia: Secondary | ICD-10-CM | POA: Diagnosis not present

## 2024-01-17 DIAGNOSIS — I5023 Acute on chronic systolic (congestive) heart failure: Secondary | ICD-10-CM | POA: Diagnosis not present

## 2024-01-17 DIAGNOSIS — E876 Hypokalemia: Secondary | ICD-10-CM | POA: Diagnosis not present

## 2024-01-17 DIAGNOSIS — I480 Paroxysmal atrial fibrillation: Secondary | ICD-10-CM | POA: Diagnosis not present

## 2024-01-17 DIAGNOSIS — E1165 Type 2 diabetes mellitus with hyperglycemia: Secondary | ICD-10-CM | POA: Diagnosis not present

## 2024-01-17 DIAGNOSIS — J9601 Acute respiratory failure with hypoxia: Secondary | ICD-10-CM | POA: Diagnosis not present

## 2024-01-17 DIAGNOSIS — I110 Hypertensive heart disease with heart failure: Secondary | ICD-10-CM | POA: Diagnosis not present

## 2024-01-17 DIAGNOSIS — I5020 Unspecified systolic (congestive) heart failure: Secondary | ICD-10-CM | POA: Diagnosis not present

## 2024-01-21 ENCOUNTER — Ambulatory Visit (HOSPITAL_BASED_OUTPATIENT_CLINIC_OR_DEPARTMENT_OTHER): Admitting: Pulmonary Disease

## 2024-01-29 DIAGNOSIS — I110 Hypertensive heart disease with heart failure: Secondary | ICD-10-CM | POA: Diagnosis not present

## 2024-01-29 DIAGNOSIS — J9601 Acute respiratory failure with hypoxia: Secondary | ICD-10-CM | POA: Diagnosis not present

## 2024-01-29 DIAGNOSIS — I5023 Acute on chronic systolic (congestive) heart failure: Secondary | ICD-10-CM | POA: Diagnosis not present

## 2024-01-29 DIAGNOSIS — E876 Hypokalemia: Secondary | ICD-10-CM | POA: Diagnosis not present

## 2024-01-29 DIAGNOSIS — I480 Paroxysmal atrial fibrillation: Secondary | ICD-10-CM | POA: Diagnosis not present

## 2024-02-03 DIAGNOSIS — G459 Transient cerebral ischemic attack, unspecified: Secondary | ICD-10-CM | POA: Diagnosis not present

## 2024-02-04 DIAGNOSIS — I110 Hypertensive heart disease with heart failure: Secondary | ICD-10-CM | POA: Diagnosis not present

## 2024-02-04 DIAGNOSIS — E876 Hypokalemia: Secondary | ICD-10-CM | POA: Diagnosis not present

## 2024-02-04 DIAGNOSIS — I5023 Acute on chronic systolic (congestive) heart failure: Secondary | ICD-10-CM | POA: Diagnosis not present

## 2024-02-04 DIAGNOSIS — I480 Paroxysmal atrial fibrillation: Secondary | ICD-10-CM | POA: Diagnosis not present

## 2024-02-04 DIAGNOSIS — J9601 Acute respiratory failure with hypoxia: Secondary | ICD-10-CM | POA: Diagnosis not present

## 2024-02-07 DIAGNOSIS — R52 Pain, unspecified: Secondary | ICD-10-CM | POA: Diagnosis not present

## 2024-02-07 DIAGNOSIS — E119 Type 2 diabetes mellitus without complications: Secondary | ICD-10-CM | POA: Diagnosis not present

## 2024-02-07 DIAGNOSIS — I10 Essential (primary) hypertension: Secondary | ICD-10-CM | POA: Diagnosis not present

## 2024-02-07 DIAGNOSIS — Z299 Encounter for prophylactic measures, unspecified: Secondary | ICD-10-CM | POA: Diagnosis not present

## 2024-02-15 DIAGNOSIS — I480 Paroxysmal atrial fibrillation: Secondary | ICD-10-CM | POA: Diagnosis not present

## 2024-02-15 DIAGNOSIS — E876 Hypokalemia: Secondary | ICD-10-CM | POA: Diagnosis not present

## 2024-02-15 DIAGNOSIS — I110 Hypertensive heart disease with heart failure: Secondary | ICD-10-CM | POA: Diagnosis not present

## 2024-02-15 DIAGNOSIS — I5023 Acute on chronic systolic (congestive) heart failure: Secondary | ICD-10-CM | POA: Diagnosis not present

## 2024-02-15 DIAGNOSIS — J9601 Acute respiratory failure with hypoxia: Secondary | ICD-10-CM | POA: Diagnosis not present

## 2024-02-15 DIAGNOSIS — E1165 Type 2 diabetes mellitus with hyperglycemia: Secondary | ICD-10-CM | POA: Diagnosis not present

## 2024-02-25 ENCOUNTER — Encounter: Payer: Self-pay | Admitting: Nurse Practitioner

## 2024-02-25 ENCOUNTER — Ambulatory Visit: Attending: Nurse Practitioner | Admitting: Nurse Practitioner

## 2024-02-25 VITALS — BP 140/88 | HR 71 | Ht 69.0 in | Wt 193.2 lb

## 2024-02-25 DIAGNOSIS — I471 Supraventricular tachycardia, unspecified: Secondary | ICD-10-CM | POA: Diagnosis not present

## 2024-02-25 DIAGNOSIS — I5020 Unspecified systolic (congestive) heart failure: Secondary | ICD-10-CM

## 2024-02-25 DIAGNOSIS — I10 Essential (primary) hypertension: Secondary | ICD-10-CM

## 2024-02-25 DIAGNOSIS — I4729 Other ventricular tachycardia: Secondary | ICD-10-CM | POA: Diagnosis not present

## 2024-02-25 DIAGNOSIS — J449 Chronic obstructive pulmonary disease, unspecified: Secondary | ICD-10-CM | POA: Diagnosis not present

## 2024-02-25 DIAGNOSIS — I480 Paroxysmal atrial fibrillation: Secondary | ICD-10-CM | POA: Diagnosis not present

## 2024-02-25 DIAGNOSIS — G4733 Obstructive sleep apnea (adult) (pediatric): Secondary | ICD-10-CM

## 2024-02-25 DIAGNOSIS — E785 Hyperlipidemia, unspecified: Secondary | ICD-10-CM | POA: Diagnosis not present

## 2024-02-25 MED ORDER — SIMVASTATIN 10 MG PO TABS: 10.0000 mg | ORAL_TABLET | Freq: Every day | ORAL | 3 refills | Status: DC

## 2024-02-25 NOTE — Patient Instructions (Signed)
 Medication Instructions:  Your physician recommends that you continue on your current medications as directed. Please refer to the Current Medication list given to you today.  *If you need a refill on your cardiac medications before your next appointment, please call your pharmacy*  Lab Work: In 3 months:  -LFT -Fasting Lipid Panel  If you have labs (blood work) drawn today and your tests are completely normal, you will receive your results only by: MyChart Message (if you have MyChart) OR A paper copy in the mail If you have any lab test that is abnormal or we need to change your treatment, we will call you to review the results.  Testing/Procedures: None  Follow-Up: At [facility], you and your health needs are our priority.  As part of our continuing mission to provide you with exceptional heart care, our providers are all part of one team.  This team includes your primary Cardiologist (physician) and Advanced Practice Providers or APPs (Physician Assistants and Nurse Practitioners) who all work together to provide you with the care you need, when you need it.  Your next appointment:   3 month(s)  Provider:   You may see Alvan Carrier, MD or the following Advanced Practice Provider on your designated Care Team:   Almarie Crate, NP    We recommend signing up for the patient portal called MyChart.  Sign up information is provided on this After Visit Summary.  MyChart is used to connect with patients for Virtual Visits (Telemedicine).  Patients are able to view lab/test results, encounter notes, upcoming appointments, etc.  Non-urgent messages can be sent to your provider as well.   To learn more about what you can do with MyChart, go to [email or web address].   Other Instructions

## 2024-02-25 NOTE — Progress Notes (Unsigned)
 Cardiology Office Note   Date:  01/10/2024 ID:  Jigar, Zielke [birth year], MRN [id number] PCP: Rosamond Leta NOVAK, MD  [facility] Providers Cardiologist:  Alvan Carrier, MD     History of Present Illness Derrick Bender is a 78 y.o. male with a PMH of hypertension, OSA, hyperlipidemia, history of TIA, COPD, and leg edema, presents today for 4=6 week follow-up.    Appears he was diagnosed with a new onset A-fib at [facility] in May 2025, however AV ZIO monitor in June 2025 did not detect any A-fib.  He was predominantly in sinus rhythm with average heart rate of 71 bpm.  He did have occasional supraventricular ectopic beats, PACs with 2% burden, 24 episodes of SVT, longest lasting 16 beats at 125 bpm.  And occasional ventricular ectopies around 2% burden.  It was noted he had multiple episodes of wide-complex tachycardia-see full report below-longest episode lasted 15 seconds at 137 bpm.   12/06/2023-today he presents for follow-up with his daughter. He admits to DOE and admits to issues with his CPAP machine. Weights are coming down, admits to unintentional weight loss, daughter says he is not eating as much as he should. Sadly, wife has Lewy body dementia and is not getting enough rest as he is helping provide care for her. Daughter tells me that he is on Eliquis  due to previous DVT diagnosed along lower extremity at [facility] after back surgery performed in November 2024. Lower extremity doppler was performed in December 2024 at [facility] was positive for occlusive DVT throughout left femoral, popliteal, and calf veins. Daughter is not certain what dosage of Eliquis  he is on - believes he is on Eliquis  2.5 mg BID. Does admit to some palpations and DOE, difficult for him to describe fully. Denies any chest pain, syncope, presyncope, dizziness, orthopnea, PND, swelling or significant weight changes, acute bleeding, or claudication.  He was hospitalized in September 2025 for acute on chronic systolic heart  failure.  Hospital course also noted by acute respiratory failure with hypoxia in the setting of COPD, CHF, and OSA.  He presented with increased shortness of breath, orthopnea and DOE.  Workup in ED showed hypertensive urgency with SBP above 200, increased BNP, chest x-ray revealed vascular congestion with interstitial edema.  He was temporarily on nitroglycerin  drip to assist with BP control, received IV diuresis.  Echocardiogram showed EF of 40%.  GDMT was adjusted.  Today he presents for scheduled follow-up.  He tells me he has not felt well in the last 2 days, feeling little bit better today.  Chief concern is weakness and feels like he has not had his full strength come back since hospitalization.  This is his most bothersome symptom.  Overall doing well and tolerating medications well. Denies any chest pain, shortness of breath, palpitations, syncope, presyncope, dizziness, orthopnea, PND, swelling or significant weight changes, acute bleeding, or claudication.   ROS: Negative. See HPI.   Studies Reviewed  EKG: EKG is not ordered today.  Echo 01/01/2024:   1. Left ventricular ejection fraction, by estimation, is 40 to 45%. The  left ventricle has mildly decreased function. The left ventricle  demonstrates global hypokinesis. There is severe left ventricular  hypertrophy. Left ventricular diastolic parameters   are consistent with Grade I diastolic dysfunction (impaired relaxation).   2. Right ventricular systolic function is normal. The right ventricular  size is normal. Tricuspid regurgitation signal is inadequate for assessing  PA pressure.   3. The mitral valve is normal  in structure. Mild mitral valve  regurgitation. No evidence of mitral stenosis.   4. The aortic valve is tricuspid. There is mild calcification of the  aortic valve. There is mild thickening of the aortic valve. Aortic valve  regurgitation is mild to moderate. No aortic stenosis is present.    Zio monitor [facility]  Health) 09/2023:  Conclusions:  - Ambulatory ECG monitoring was performed from 09/16/23 to 09/30/23.  - The predominant rhythm was sinus rhythm, with the rate ranging from 42  to 117 and averaging 71 bpm when in sinus rhythm  - Occasional supraventricular ectopics (PACs) were recorded (2.0% burden)  with 24 episodes of SVT, longest lasting 16 beats at 125 bpm  - Occasional ventricular ectopics (PVCs) were recorded (1.8% burden) with  4003 episodes of wide-complex tachycardia, longest lasting 15.4 seconds at  137 bpm  - No patient-initiated recordings/events were submitted.  - No pauses >3 seconds or high-grade AV block detected  - No atrial fibrillation detected  Echocardiogram in May 2025 with [facility]:  EF 25 to 30%, mild AR, right ventricular was felt to be probably normal systolic function with moderate pulmonary hypertension.   Lexiscan  12/2015:  No diagnostic ST segment changes to indicate ischemia. Occasional PVCs. Small, mild intensity, inferior defect that is fixed and most prominent at rest, consistent with soft tissue attenuation. No large ischemic territories. This is a low risk study. Nuclear stress EF: 65%.      Physical Exam VS:  There were no vitals taken for this visit.       Wt Readings from Last 3 Encounters:  01/10/24 193 lb 9.6 oz (87.8 kg)  01/02/24 182 lb 15.7 oz (83 kg)  12/13/23 192 lb 12.8 oz (87.5 kg)    GEN: Well nourished, well developed in no acute distress NECK: No JVD; No carotid bruits CARDIAC: S1/S2, RRR, no murmurs, rubs, gallops RESPIRATORY:  Clear to auscultation without rales, wheezing or rhonchi  ABDOMEN: Soft, non-tender, non-distended EXTREMITIES:  No edema; No deformity   ASSESSMENT AND PLAN  HFmrEF Stage C, NYHA class I-II symptoms. EF 25-30% in May 2025 at [facility], with improvement in EF to 40 to 45% in September 2025.  Most recent BNP 1,757.  Weight is up slightly from last office visit.  Will increase Lasix  to 40 mg twice daily x 2 days, then  reduce Lasix  to 40 mg daily.  Will obtain proBNP, BMET, in 1 to 2 weeks.  He cannot tolerate Jardiance as this caused unintentional weight loss in the past. Continue rest of medication regimen. Low sodium diet, fluid restriction <2L, and daily weights encouraged. Educated to contact our office for weight gain of 2 lbs overnight or 5 lbs in one week.   PAF, PSVT, NSVT Denies any tachycardia or palpitations.  Heart rates well-controlled.  See most recent monitor report from [facility] above. Discussed avoiding triggers to A-fib.  No medication changes at this time.  HTN BP is stable. Discussed to monitor BP at home at least 2 hours after medications and sitting for 5-10 minutes. No med changes at this time. Heart healthy diet and regular cardiovascular exercise encouraged.   HLD LDL 135 08/2023. Not addressed at this visit, but plan to address at next visit. Heart healthy diet and regular cardiovascular exercise encouraged.   OSA, COPD Denies any recent shortness of breath or acute exacerbations.  Continue follow-up with pulmonology and encouraged him to continue compliance with CPAP machine.  6. Weakness Most likely related to deconditioning since hospitalization.  Recommended physical activity exercises he can do at home to gain back his strength.  Continue follow-up with PCP.   I spent a total duration of 30 minutes reviewing prior notes, reviewing outside records including  labs, face-to-face counseling of medical condition, pathophysiology, evaluation, management, and documenting the findings in the note.   Dispo: Care and ED precautions discussed. Follow-up with MD/APP in 4-6 weeks or sooner if anything changes.   Signed, Almarie Crate, NP

## 2024-02-29 ENCOUNTER — Ambulatory Visit (HOSPITAL_BASED_OUTPATIENT_CLINIC_OR_DEPARTMENT_OTHER): Admitting: Pulmonary Disease

## 2024-02-29 ENCOUNTER — Encounter (HOSPITAL_BASED_OUTPATIENT_CLINIC_OR_DEPARTMENT_OTHER): Payer: Self-pay | Admitting: Pulmonary Disease

## 2024-02-29 VITALS — BP 127/76 | HR 53 | Ht 69.0 in | Wt 184.0 lb

## 2024-02-29 DIAGNOSIS — G4733 Obstructive sleep apnea (adult) (pediatric): Secondary | ICD-10-CM | POA: Diagnosis not present

## 2024-02-29 DIAGNOSIS — J4489 Other specified chronic obstructive pulmonary disease: Secondary | ICD-10-CM

## 2024-02-29 NOTE — Progress Notes (Signed)
 Subjective:    Patient ID: Derrick Bender, male    DOB: 1945-11-26, 78 y.o.   MRN: [id number]  78 yo remote smoker for FU of OSA and COPD He reports a lifelong history of asthma.  He smoked about half pack per day as a young adult until he quit in 1997, about 20 pack years.  He worked in [employer].  For some reason he has been maintained on prednisone  5 to 10 mg for almost 20 years by his PCP review of imaging studies shows scattered groundglass infiltrates dating back to 2021 and present on more recent CT imaging in February 2024.  His PFTs dating back to 2009 show moderate restriction, DLCO is decreased but corrects for alveolar volume. Imaging dating back to 2021 shows groundglass opacities which may be post-COVID      PMH :  HFrEF -improved to 40-45% seizure d/o Adrenal insufficiency due to long-term steroids admitted to [facility] for diverticular abscess causing Bacteroides bacteremia and treated with antibiotics      Discussed the use of AI scribe software for clinical note transcription with the patient, who gave verbal consent to proceed.  History of Present Illness Derrick Bender is a 78 year old male with COPD who presents for follow-up of steroid-dependent COPD and issues with CPAP use. He is accompanied by his daughter.  He has not used his CPAP machine since it stopped working last year, and attempts to replace it were unsuccessful due to discontinuation. He uses oxygen  at night, initiated during a recent hospital admission for heart failure and fluid buildup, but is uncertain of its benefit as he sleeps well without it. Previously, he used a CPAP with a small nose mask comfortably.  He takes Symbicort as needed and 10 mg of prednisone  daily, adjusted after a recent episode of atrial fibrillation and congestive heart failure. Attempts to lower the prednisone  dose in the past led to difficulty breathing, making him cautious about further  reduction.  His insurance now covers a replacement CPAP machine, and he is awaiting a new prescription to be sent to a local company.     Reviewed hosp dc summary, cards consultation, previous echo, imaging   Significant tests/ events reviewed   10/2018 NPSG >> AHI 43/h, low sat 89% , wt 227 lbs 03/2011 NPSG >> AHI 29/h, low sat 85%    09/2022 PFTs >> stable restriction, ratio 85, FVC 54, TLC 52, DLCO 57/13.8 , KCO normal PFTs 11/2007 >> ratio 83, FVC 53%, TLC 63, DLCO 55 corrects for Va - mod restriction, wt 255    CTA 04/2023 >> Mild emphysema. There areas of scarring within both lungs. GGO & mosaic pattern more in the right lung. CTA chest 05/2022 - exp images, scattered GGO 11/2019 CT chest wo con >> areas of irregular scarring, previous CT 06/2017 clear   Echo 09/2021 mod AI, nml RVSF      Review of Systems  neg for any significant sore throat, dysphagia, itching, sneezing, nasal congestion or excess/ purulent secretions, fever, chills, sweats, unintended wt loss, pleuritic or exertional cp, hempoptysis, orthopnea pnd or change in chronic leg swelling. Also denies presyncope, palpitations, heartburn, abdominal pain, nausea, vomiting, diarrhea or change in bowel or urinary habits, dysuria,hematuria, rash, arthralgias, visual complaints, headache, numbness weakness or ataxia.      Objective:   Physical Exam  Gen. Pleasant, elderly,obese, in no distress ENT - no lesions, no post nasal drip Neck: No JVD, no thyromegaly, no carotid  bruits Lungs: no use of accessory muscles, no dullness to percussion, decreased without rales or rhonchi  Cardiovascular: Rhythm regular, heart sounds  normal, no murmurs or gallops, no peripheral edema Musculoskeletal: No deformities, no cyanosis or clubbing , no tremors        Assessment & Plan:   Assessment and Plan Assessment & Plan Chronic obstructive pulmonary disease (COPD), steroid dependent He is on a long-term prednisone  regimen,  currently taking 10 mg daily. Previous attempts to taper prednisone  resulted in respiratory difficulties. Discussed potential side effects of long-term prednisone  use, including bone weakening and other systemic effects. He prefers to maintain the current dose due to past difficulties with tapering. - Continue prednisone  10 mg daily. - Continue Symbicort as needed.  Obstructive sleep apnea Previously managed with CPAP, which was discontinued due to insurance issues. He has been using oxygen  at night for the past six weeks following a hospitalization for heart failure. Plans to restart CPAP therapy with oxygen  integration. Nocturnal oximetry will be used to assess oxygen  levels during sleep without CPAP oxygen  support. - Sent prescription for CPAP machine to a local company in [place]. - Will conduct nocturnal oximetry to assess oxygen  levels during sleep without CPAP oxygen  support.  Congestive heart failure Recent hospitalization for heart failure and fluid buildup. Current management includes monitoring for fluid retention and weight gain. Recent echocardiogram showed improvement in heart function. - Continue monitoring for signs of fluid retention and weight gain.

## 2024-02-29 NOTE — Patient Instructions (Signed)
 X Rx for autoCPAP 5-15 cm to DME ([facility] [facility] [place])    VISIT SUMMARY: Derrick Bender, a 78 year old male with COPD, came in for a follow-up regarding his steroid-dependent COPD and issues with CPAP use. He has not used his CPAP machine since it stopped working last year and has been using oxygen  at night following a recent hospitalization for heart failure. He is currently on a long-term prednisone  regimen and uses Symbicort as needed. His insurance now covers a replacement CPAP machine, and a new prescription has been sent to a local company.  YOUR PLAN: -CHRONIC OBSTRUCTIVE PULMONARY DISEASE (COPD), STEROID DEPENDENT: COPD is a chronic lung disease that makes it hard to breathe. You are currently taking 10 mg of prednisone  daily to manage your symptoms, as previous attempts to reduce the dose caused breathing difficulties. We discussed the potential side effects of long-term prednisone  use, such as bone weakening. You should continue taking prednisone  10 mg daily and use Symbicort as needed.  -OBSTRUCTIVE SLEEP APNEA: Obstructive sleep apnea is a condition where your breathing stops and starts during sleep. You previously managed this with a CPAP machine, which you stopped using due to insurance issues. You have been using oxygen  at night for the past six weeks. We have sent a prescription for a new CPAP machine to a local company, and we will conduct nocturnal oximetry to assess your oxygen  levels during sleep without CPAP oxygen  support.  INSTRUCTIONS: We have sent a prescription for a new CPAP machine to a local company in [place]. We will also conduct nocturnal oximetry to assess your oxygen  levels during sleep without CPAP oxygen  support. Please continue to monitor for signs of fluid retention and weight gain.                      Contains text generated by [employer].                                 Contains text generated by [employer].

## 2024-03-05 DIAGNOSIS — G459 Transient cerebral ischemic attack, unspecified: Secondary | ICD-10-CM | POA: Diagnosis not present

## 2024-03-19 DIAGNOSIS — G4733 Obstructive sleep apnea (adult) (pediatric): Secondary | ICD-10-CM | POA: Diagnosis not present

## 2024-03-20 DIAGNOSIS — E785 Hyperlipidemia, unspecified: Secondary | ICD-10-CM | POA: Diagnosis not present

## 2024-03-21 ENCOUNTER — Encounter: Payer: Self-pay | Admitting: Internal Medicine

## 2024-03-24 ENCOUNTER — Telehealth (HOSPITAL_BASED_OUTPATIENT_CLINIC_OR_DEPARTMENT_OTHER): Payer: Self-pay

## 2024-03-24 NOTE — Telephone Encounter (Signed)
 Tried to reach out to daughter at number listed and mailbox is full and unable to notify that we are waiting on ONO to be completed before we DC oxygen 

## 2024-03-24 NOTE — Telephone Encounter (Unsigned)
 Copied from CRM [id number]. Topic: General - Other >> Mar 21, 2024  3:52 PM Whitney O wrote: Reason for CRM: patient daughter is calling cause she  need a discontinued script for them to pick up the oxygen  per [employer] . He has 6 bottles and he is not using them and trying to get them to be picked up  Please reach out to patient daughter concerning this  [phone]

## 2024-03-25 ENCOUNTER — Ambulatory Visit: Payer: Self-pay | Admitting: Nurse Practitioner

## 2024-03-26 MED ORDER — SIMVASTATIN 20 MG PO TABS: 20.0000 mg | ORAL_TABLET | Freq: Every day | ORAL | 3 refills | Status: DC

## 2024-03-26 NOTE — Telephone Encounter (Signed)
-----   Message from Almarie Crate sent at 03/25/2024  9:27 AM EST ----- Labs look much better. Due to his history of TIA, I would like his LDL to be lower. Let's increase his simvastatin  to 20 mg daily and repeat FLP/LFT in 3 months.   Thanks!  Almarie Crate, AGNP-C ----- Message ----- From: Gretel Darryle RAMAN Sent: 03/21/2024  10:55 AM EST To: Almarie Crate, NP

## 2024-03-26 NOTE — Telephone Encounter (Signed)
 Attempted to call but VM is full  unable to leave message

## 2024-03-26 NOTE — Telephone Encounter (Signed)
 Daughter Giles) returned staff call.

## 2024-04-01 NOTE — Telephone Encounter (Signed)
 Attempted to call but VM box is full

## 2024-05-20 ENCOUNTER — Ambulatory Visit: Admitting: Nurse Practitioner

## 2024-05-22 ENCOUNTER — Other Ambulatory Visit: Payer: Self-pay | Admitting: Nurse Practitioner

## 2024-05-23 ENCOUNTER — Ambulatory Visit: Admitting: Cardiology

## 2024-05-23 ENCOUNTER — Encounter: Payer: Self-pay | Admitting: Cardiology

## 2024-05-23 VITALS — BP 132/80 | HR 59 | Ht 69.0 in | Wt 207.8 lb

## 2024-05-23 DIAGNOSIS — I480 Paroxysmal atrial fibrillation: Secondary | ICD-10-CM

## 2024-05-23 DIAGNOSIS — I10 Essential (primary) hypertension: Secondary | ICD-10-CM

## 2024-05-23 DIAGNOSIS — I5022 Chronic systolic (congestive) heart failure: Secondary | ICD-10-CM

## 2024-05-23 DIAGNOSIS — E782 Mixed hyperlipidemia: Secondary | ICD-10-CM

## 2024-05-23 MED ORDER — ATORVASTATIN CALCIUM 40 MG PO TABS: 40.0000 mg | ORAL_TABLET | Freq: Every day | ORAL | 6 refills | Status: AC

## 2024-05-23 NOTE — Patient Instructions (Addendum)
 Medication Instructions:   Stop Simvastatin  (Zocor ) Begin Lipitor 40mg  daily  Continue all other medications.     Labwork:  none  Testing/Procedures:  Your physician has requested that you have a limited echocardiogram. Echocardiography is a painless test that uses sound waves to create images of your heart. It provides your doctor with information about the size and shape of your heart and how well your hearts chambers and valves are working. This procedure takes approximately one hour. There are no restrictions for this procedure. Please do NOT wear cologne, perfume, aftershave, or lotions (deodorant is allowed). Please arrive 15 minutes prior to your appointment time.  Please note: We ask at that you not bring children with you during ultrasound (echo/ vascular) testing. Due to room size and safety concerns, children are not allowed in the ultrasound rooms during exams. Our front office staff cannot provide observation of children in our lobby area while testing is being conducted. An adult accompanying a patient to their appointment will only be allowed in the ultrasound room at the discretion of the ultrasound technician under special circumstances. We apologize for any inconvenience.  Office will contact with results via phone, letter or mychart.     Follow-Up:  4 months   Any Other Special Instructions Will Be Listed Below (If Applicable).  Will send medication assistance team a message in regards to your Eliquis .  If you need a refill on your cardiac medications before your next appointment, please call your pharmacy.

## 2024-05-23 NOTE — Progress Notes (Signed)
 "     Clinical Summary Derrick Bender is a 79 y.o.male seen today for follow up of the following medical problems.    1.HFmrEF - 2017 echo LVEF 65-70%, no WMAs, grade Idd, mild MR and AI 09/2021 echo: LVEF 60-65%, no WMAs, grade I dd, normal RV, mild MR, mild to mod AI  08/2023 echo [facility]: LVEF 25-30%, mild AI -drop in EF was in the setting of newly diagnosed afib, thought possibly tachy mediated.   12/2023 echo: LVEF 40-45%, grade I dd, severe LVH, normal RV, mild MR, mild to mod AI.   - from prior notes SGLT2i stopped due to unexplained weight loss  - no recent edema, no SOB/DOE - compliant with meds      2. OSA - compliant with cpap - followed by Dr Jude   3. HTN - compliantw ith meds   4. Hyperlipidemia - 01/2022 TC 175 TG 107 HDL 63 LDL 93     5. History of TIA - has been on plavix    6. COPD - followed by primary  7. PAF - new diagnosis afib 08/2023 admission at [facility] 09/2023 monitor [facility]: PACs, 24 episodes of SVT longest 16 beats. Occasional PVCs, 4002 episodes WCT longest 15.4 seconds. No afib detected - he stopped eliquis  on his own due to cost  8. History of DVT  Past Medical History:  Diagnosis Date   Asthma    COPD (chronic obstructive pulmonary disease) (HCC)    CVA (cerebral vascular accident) (HCC) 01/2011   [facility]   Depression    Diabetes (HCC)    Dysarthria    Hyperlipidemia    Hypertension    OSA (obstructive sleep apnea)    Prostate cancer (HCC)    Dr. Beola   Seizure [facility])    TIA (transient ischemic attack)      Allergies[1]   Current Outpatient Medications  Medication Sig Dispense Refill   albuterol  (PROAIR  HFA) 108 (90 Base) MCG/ACT inhaler Inhale 2-3 puffs into the lungs every 6 (six) hours as needed.     ALPRAZolam  (XANAX ) 0.25 MG tablet Take 1 tablet (0.25 mg total) by mouth every 12 (twelve) hours as needed. 20 tablet 0   apixaban  (ELIQUIS ) 5 MG TABS tablet Take 5 mg by mouth 2 (two) times daily. (Patient not taking: Reported on  02/29/2024)     budesonide-formoterol  (SYMBICORT) 160-4.5 MCG/ACT inhaler Inhale 2 puffs into the lungs daily. (Patient not taking: Reported on 02/29/2024)     donepezil  (ARICEPT ) 5 MG tablet Take 5 mg by mouth daily.     furosemide  (LASIX ) 40 MG tablet Take 1 tablet (40 mg total) by mouth daily. 30 tablet 2   Melatonin 10 MG TABS Take 10 mg by mouth at bedtime.     metoprolol  succinate (TOPROL -XL) 25 MG 24 hr tablet TAKE 1 TABLET (25 MG) BY MOUTH IN THE MORNING AND AT BEDTIME 180 tablet 1   mirtazapine  (REMERON ) 7.5 MG tablet Take 7.5 mg by mouth at bedtime.     pantoprazole  (PROTONIX ) 40 MG tablet Take 40 mg by mouth daily. (Patient not taking: Reported on 02/29/2024)     predniSONE  (DELTASONE ) 10 MG tablet Take 10 mg by mouth 2 (two) times daily. (Patient taking differently: Take 10 mg by mouth daily with breakfast.)     sacubitril -valsartan  (ENTRESTO ) 49-51 MG Take 1 tablet by mouth 2 (two) times daily. (Patient not taking: Reported on 02/29/2024) 60 tablet 2   simvastatin  (ZOCOR ) 20 MG tablet Take 1 tablet (20 mg total)  by mouth at bedtime. 90 tablet 3   spironolactone  (ALDACTONE ) 25 MG tablet Take 0.5 tablets (12.5 mg total) by mouth daily. 30 tablet 2   tamsulosin  (FLOMAX ) 0.4 MG CAPS capsule Take 0.4 mg by mouth daily.     topiramate  (TOPAMAX ) 100 MG tablet Take 100 mg by mouth 2 (two) times daily. (Patient not taking: Reported on 02/29/2024)     No current facility-administered medications for this visit.     Past Surgical History:  Procedure Laterality Date   XI ROBOTIC ASSISTED SIMPLE PROSTATECTOMY  03/2013   Dr. Tommi     Allergies[2]    Family History  Problem Relation Age of Onset   Bone cancer Mother    Pancreatic cancer Father      Social History Mr. Welty reports that he quit smoking about 29 years ago. His smoking use included cigarettes. He started smoking about 64 years ago. He has a 18.5 pack-year smoking history. He has never been exposed to tobacco smoke.  He has never used smokeless tobacco. Mr. Banner has no history on file for alcohol  use.     Physical Examination Today's Vitals   05/23/24 1440  BP: 132/80  Pulse: (!) 59  SpO2: 98%  Weight: 207 lb 12.8 oz (94.3 kg)  Height: 5' 9 (1.753 m)   Body mass index is 30.69 kg/m.  Gen: resting comfortably, no acute distress HEENT: no scleral icterus, pupils equal round and reactive, no palptable cervical adenopathy,  CV: RRR, no mrg, no jvd Resp: Clear to auscultation bilaterally GI: abdomen is soft, non-tender, non-distended, normal bowel sounds, no hepatosplenomegaly MSK: extremities are warm, no edema.  Skin: warm, no rash Neuro:  no focal deficits Psych: appropriate affect   Diagnostic Studies  12/2015 nuclear stress   No diagnostic ST segment changes to indicate ischemia. Occasional PVCs. Small, mild intensity, inferior defect that is fixed and most prominent at rest, consistent with soft tissue attenuation. No large ischemic territories. This is a low risk study. Nuclear stress EF: 65%.   09/2021 echo   IMPRESSIONS     1. Left ventricular ejection fraction, by estimation, is 60 to 65%. The  left ventricle has normal function. The left ventricle has no regional  wall motion abnormalities. Left ventricular diastolic parameters are  consistent with Grade I diastolic  dysfunction (impaired relaxation).   2. Right ventricular systolic function is normal. The right ventricular  size is normal. Tricuspid regurgitation signal is inadequate for assessing  PA pressure.   3. The mitral valve is abnormal. Mild mitral valve regurgitation. No  evidence of mitral stenosis.   4. The tricuspid valve is abnormal.   5. The aortic valve is tricuspid. There is mild calcification of the  aortic valve. There is mild thickening of the aortic valve. Aortic valve  regurgitation is mild to moderate. No aortic stenosis is present.    Assessment and Plan   Chronic HFmrEF -repeat echo,  initial improvement in LVEF, check to see if full recovery - if ongoing dysfunction work to titrate meds. Not clear to me why his SGLT2i was stopped, notes had mentioned unexpected weight loss. Would reconsider trial if ongong LV dysfunction    2. HTN - at goal, continue current meds   3. Hyperlipidemia - history of TIA, LDL is not at goal. Stop simvastatin , start atorvastatin  40mg  daily   4. Afib/acquired thrombophilia - no symptoms - troubles affording eliquis , will ask pharmacy team to help see what his options are  Dorn PHEBE Ross, M.D.,     [1]  Allergies Allergen Reactions   Sulfamethoxazole Dermatitis  [2]  Allergies Allergen Reactions   Sulfamethoxazole Dermatitis   "

## 2024-05-26 ENCOUNTER — Ambulatory Visit (HOSPITAL_BASED_OUTPATIENT_CLINIC_OR_DEPARTMENT_OTHER)

## 2024-06-11 ENCOUNTER — Ambulatory Visit

## 2024-09-23 ENCOUNTER — Ambulatory Visit: Admitting: Cardiology
